# Patient Record
Sex: Female | Born: 1970 | Race: Black or African American | Hispanic: No | Smoking: Never smoker
Health system: Southern US, Community
[De-identification: ages and names within clinical notes are randomized; demographics above are authoritative.]

## PROBLEM LIST (undated history)

## (undated) DIAGNOSIS — D509 Iron deficiency anemia, unspecified: Secondary | ICD-10-CM

## (undated) DIAGNOSIS — E669 Obesity, unspecified: Secondary | ICD-10-CM

## (undated) DIAGNOSIS — I10 Essential (primary) hypertension: Secondary | ICD-10-CM

## (undated) DIAGNOSIS — D500 Iron deficiency anemia secondary to blood loss (chronic): Secondary | ICD-10-CM

## (undated) DIAGNOSIS — N921 Excessive and frequent menstruation with irregular cycle: Secondary | ICD-10-CM

## (undated) HISTORY — DX: Obesity, unspecified: E66.9

## (undated) HISTORY — DX: Excessive and frequent menstruation with irregular cycle: N92.1

## (undated) HISTORY — DX: Iron deficiency anemia secondary to blood loss (chronic): D50.0

## (undated) HISTORY — DX: Iron deficiency anemia, unspecified: D50.9

## (undated) HISTORY — DX: Essential (primary) hypertension: I10

---
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
## 1999-10-06 ENCOUNTER — Emergency Department (HOSPITAL_COMMUNITY): Admission: EM | Admit: 1999-10-06 | Discharge: 1999-10-06 | Payer: Self-pay | Admitting: Emergency Medicine

## 2000-07-16 ENCOUNTER — Inpatient Hospital Stay (HOSPITAL_COMMUNITY): Admission: AD | Admit: 2000-07-16 | Discharge: 2000-07-16 | Payer: Self-pay | Admitting: Obstetrics & Gynecology

## 2000-08-10 ENCOUNTER — Other Ambulatory Visit: Admission: RE | Admit: 2000-08-10 | Discharge: 2000-08-10 | Payer: Self-pay | Admitting: Obstetrics and Gynecology

## 2000-09-18 ENCOUNTER — Encounter: Payer: Self-pay | Admitting: Obstetrics and Gynecology

## 2000-09-18 ENCOUNTER — Ambulatory Visit (HOSPITAL_COMMUNITY): Admission: RE | Admit: 2000-09-18 | Discharge: 2000-09-18 | Payer: Self-pay | Admitting: Obstetrics and Gynecology

## 2000-10-12 ENCOUNTER — Encounter: Payer: Self-pay | Admitting: Obstetrics and Gynecology

## 2000-10-12 ENCOUNTER — Ambulatory Visit (HOSPITAL_COMMUNITY): Admission: RE | Admit: 2000-10-12 | Discharge: 2000-10-12 | Payer: Self-pay | Admitting: Obstetrics and Gynecology

## 2000-12-01 ENCOUNTER — Encounter: Payer: Self-pay | Admitting: Obstetrics and Gynecology

## 2000-12-01 ENCOUNTER — Ambulatory Visit (HOSPITAL_COMMUNITY): Admission: RE | Admit: 2000-12-01 | Discharge: 2000-12-01 | Payer: Self-pay | Admitting: Obstetrics and Gynecology

## 2000-12-12 ENCOUNTER — Encounter: Payer: Self-pay | Admitting: Obstetrics and Gynecology

## 2000-12-12 ENCOUNTER — Ambulatory Visit (HOSPITAL_COMMUNITY): Admission: RE | Admit: 2000-12-12 | Discharge: 2000-12-12 | Payer: Self-pay | Admitting: Obstetrics and Gynecology

## 2001-01-22 ENCOUNTER — Encounter: Payer: Self-pay | Admitting: Obstetrics and Gynecology

## 2001-01-22 ENCOUNTER — Ambulatory Visit (HOSPITAL_COMMUNITY): Admission: RE | Admit: 2001-01-22 | Discharge: 2001-01-22 | Payer: Self-pay | Admitting: Obstetrics and Gynecology

## 2001-02-09 ENCOUNTER — Inpatient Hospital Stay (HOSPITAL_COMMUNITY): Admission: AD | Admit: 2001-02-09 | Discharge: 2001-02-13 | Payer: Self-pay | Admitting: Obstetrics and Gynecology

## 2001-04-17 ENCOUNTER — Emergency Department (HOSPITAL_COMMUNITY): Admission: EM | Admit: 2001-04-17 | Discharge: 2001-04-17 | Payer: Self-pay | Admitting: Emergency Medicine

## 2001-04-17 ENCOUNTER — Encounter: Payer: Self-pay | Admitting: Emergency Medicine

## 2001-12-19 ENCOUNTER — Other Ambulatory Visit: Admission: RE | Admit: 2001-12-19 | Discharge: 2001-12-19 | Payer: Self-pay | Admitting: Obstetrics and Gynecology

## 2002-08-28 ENCOUNTER — Emergency Department (HOSPITAL_COMMUNITY): Admission: EM | Admit: 2002-08-28 | Discharge: 2002-08-28 | Payer: Self-pay | Admitting: Emergency Medicine

## 2003-04-16 ENCOUNTER — Other Ambulatory Visit: Admission: RE | Admit: 2003-04-16 | Discharge: 2003-04-16 | Payer: Self-pay | Admitting: Obstetrics and Gynecology

## 2003-07-12 ENCOUNTER — Emergency Department (HOSPITAL_COMMUNITY): Admission: EM | Admit: 2003-07-12 | Discharge: 2003-07-12 | Payer: Self-pay | Admitting: Emergency Medicine

## 2003-08-30 HISTORY — PX: TUBAL LIGATION: SHX77

## 2003-12-25 ENCOUNTER — Ambulatory Visit (HOSPITAL_COMMUNITY): Admission: RE | Admit: 2003-12-25 | Discharge: 2003-12-25 | Payer: Self-pay | Admitting: Cardiovascular Disease

## 2004-01-19 ENCOUNTER — Ambulatory Visit (HOSPITAL_COMMUNITY): Admission: RE | Admit: 2004-01-19 | Discharge: 2004-01-19 | Payer: Self-pay | Admitting: Obstetrics and Gynecology

## 2004-02-17 ENCOUNTER — Ambulatory Visit (HOSPITAL_COMMUNITY): Admission: RE | Admit: 2004-02-17 | Discharge: 2004-02-17 | Payer: Self-pay | Admitting: Obstetrics and Gynecology

## 2004-06-10 ENCOUNTER — Encounter (INDEPENDENT_AMBULATORY_CARE_PROVIDER_SITE_OTHER): Payer: Self-pay | Admitting: *Deleted

## 2004-06-10 ENCOUNTER — Inpatient Hospital Stay (HOSPITAL_COMMUNITY): Admission: RE | Admit: 2004-06-10 | Discharge: 2004-06-13 | Payer: Self-pay | Admitting: Obstetrics and Gynecology

## 2004-06-18 ENCOUNTER — Emergency Department (HOSPITAL_COMMUNITY): Admission: EM | Admit: 2004-06-18 | Discharge: 2004-06-19 | Payer: Self-pay | Admitting: Emergency Medicine

## 2004-07-21 ENCOUNTER — Other Ambulatory Visit: Admission: RE | Admit: 2004-07-21 | Discharge: 2004-07-21 | Payer: Self-pay | Admitting: Obstetrics and Gynecology

## 2004-12-11 IMAGING — US US OB FOLLOW-UP
1 series · 13 of 28 positions shown · non-contrast
Comparison: none

CLINICAL DATA: Incomplete imaging of the fetal spine and heart on previous anatomy evaluation.

[Series 1: unknown · 0.32mm/px · 13 of 34 slices shown]
[im 2/34]
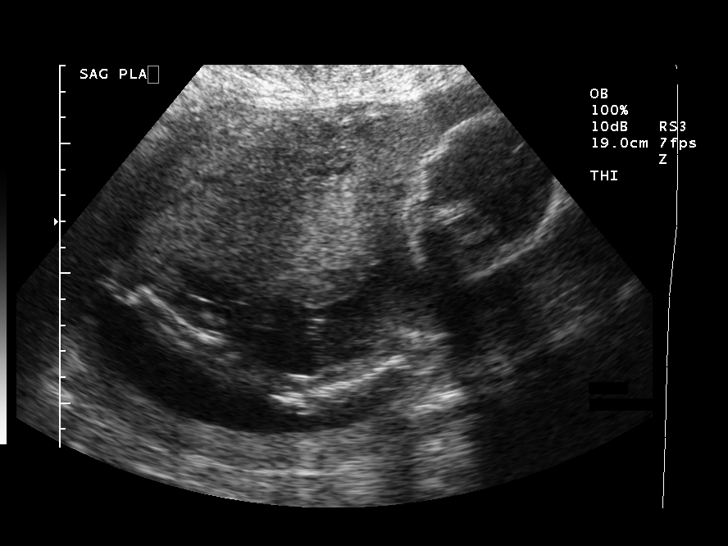
[im 4/34]
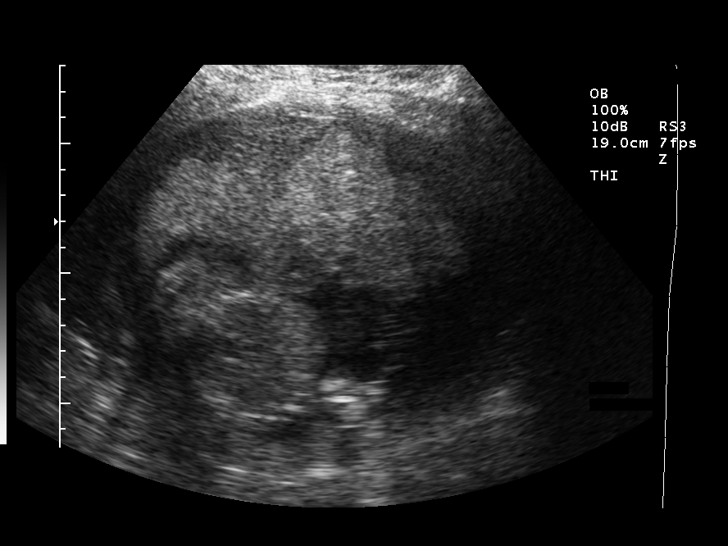
[im 7/34]
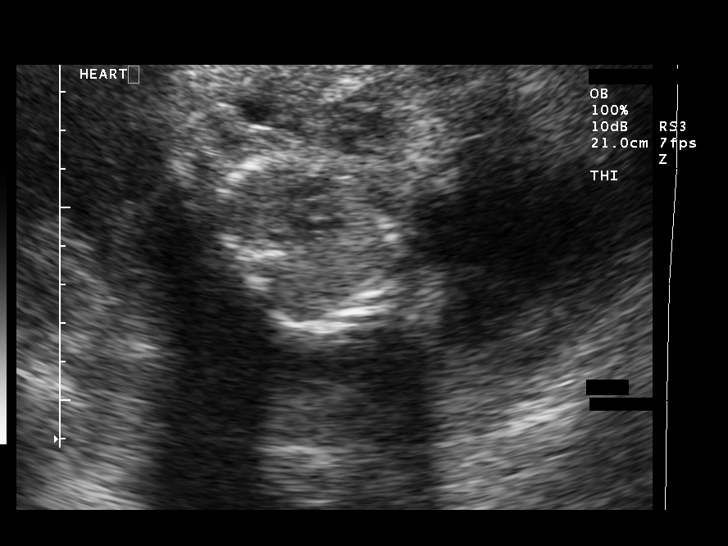
[im 9/34]
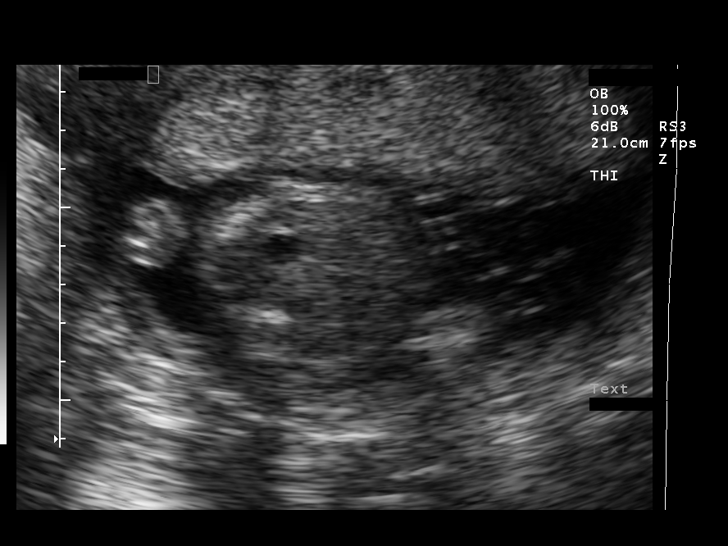
[im 12/34]
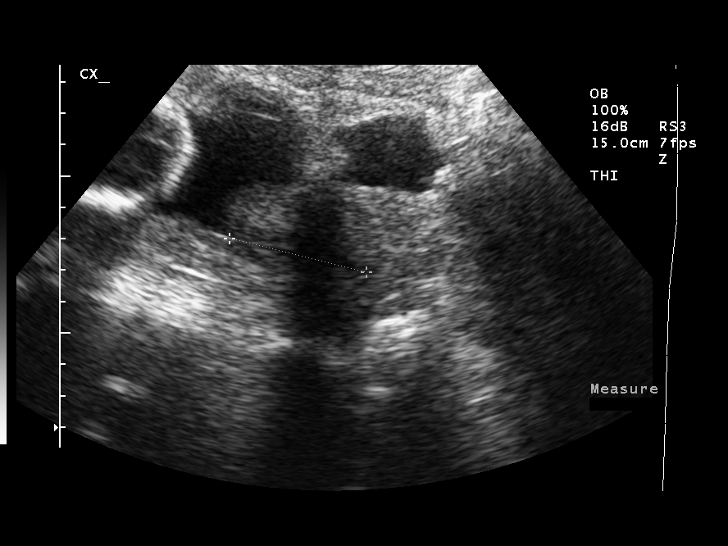
[im 14/34]
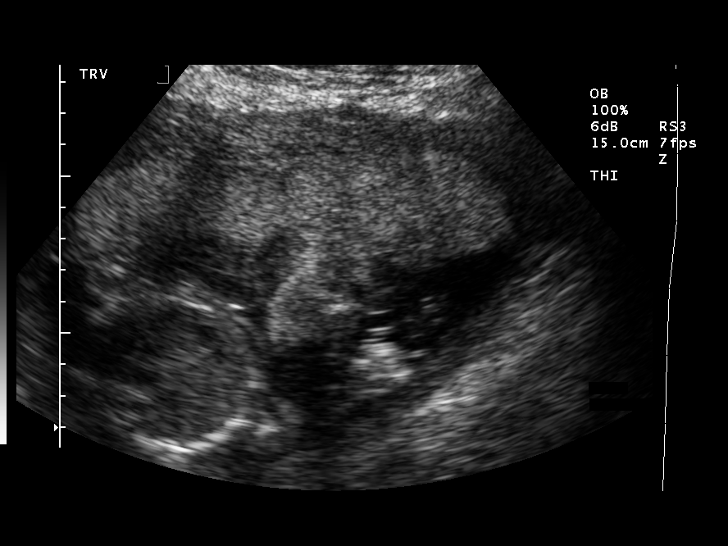
[im 18/34]
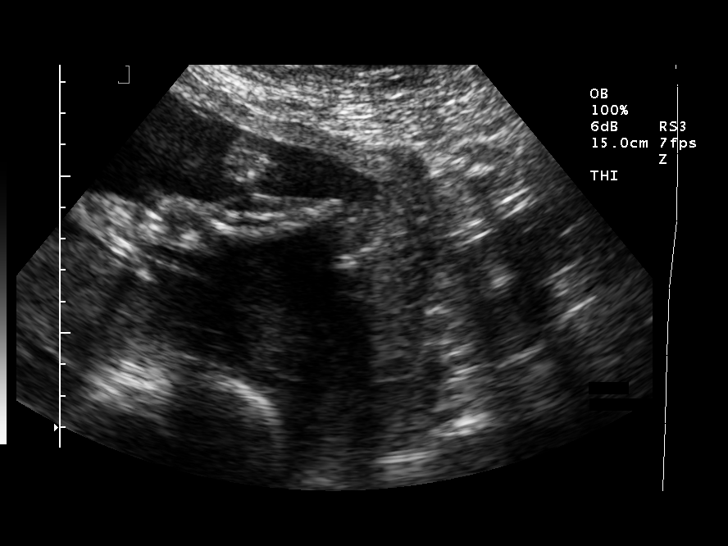
[im 20/34]
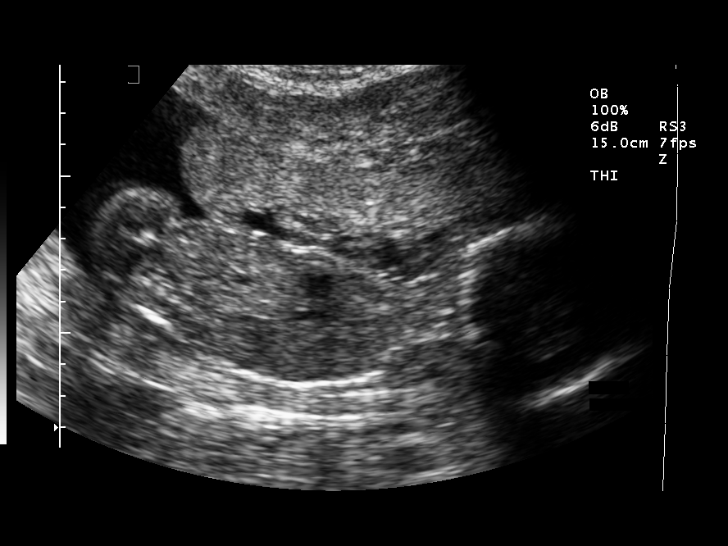
[im 23/34]
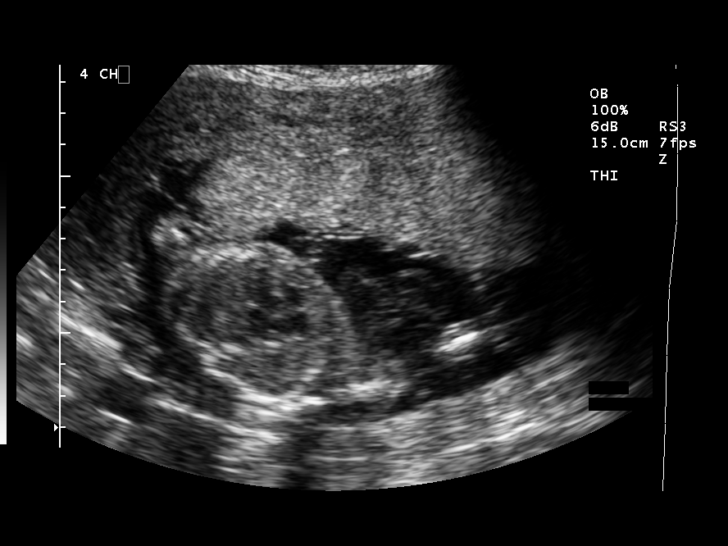
[im 25/34]
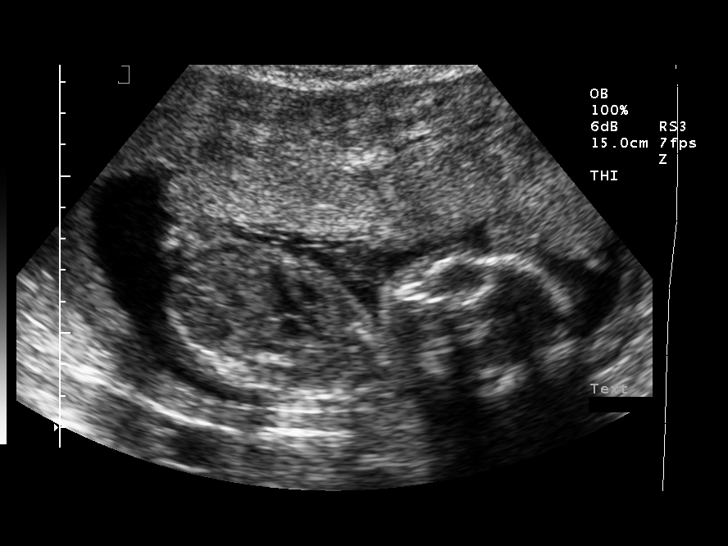
[im 27/34]
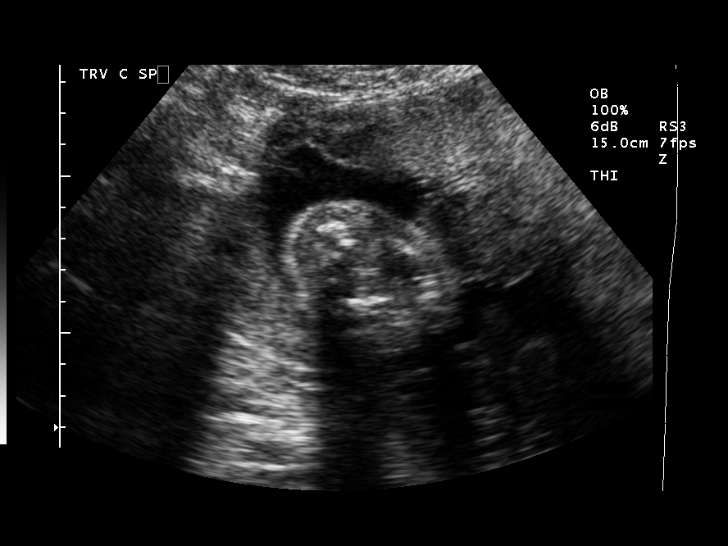
[im 30/34]
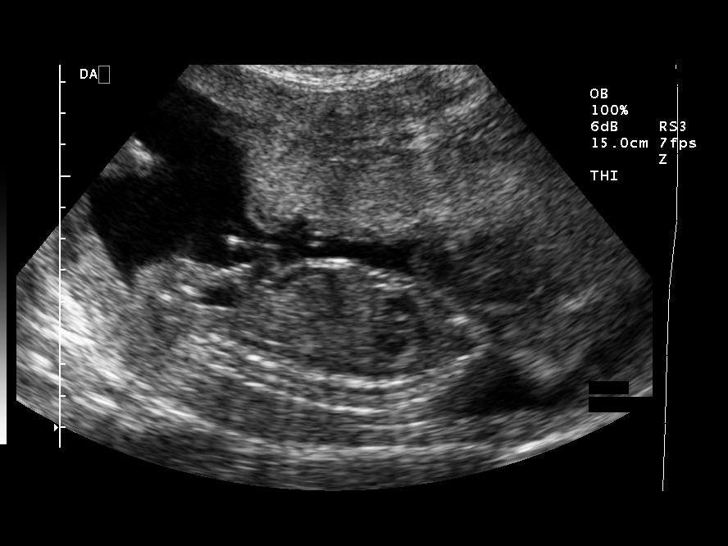
[im 32/34]
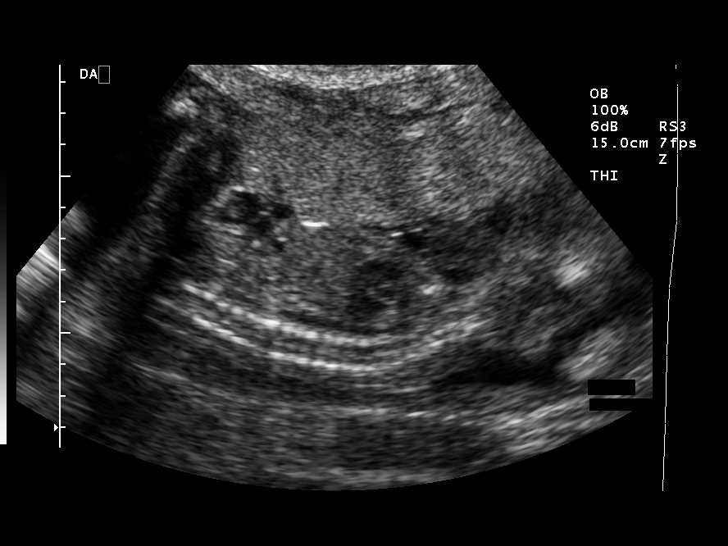

[13 of 28 positions shown; findings below may reference images not displayed]

OBSTETRICAL ULTRASOUND RE-EVALUATION
 Comparison 01/19/04.
 Number of Fetuses:  1
 Heart Rate:  147
 Movement:  Yes
 Breathing:  No
 Presentation:  Cephalic 
 Placental Location:  Anterior
 Grade:  I
 Previa:  No
 Amniotic Fluid (subjective):  Normal
 Amniotic Fluid (objective):  6.2 cm Vertical pocket 
 Fetal measurements were not requested on today’s exam.
 FETAL ANATOMY
 Lateral Ventricles:  Previously seen 
 Thalami/CSP:  Previously seen 
 Posterior Fossa:  Previously seen 
 Nuchal Region:  Previously seen 
 Spine:  Not visualized 
 4 Chamber Heart on Left:  Previously seen 
 Stomach on Left:  Visualized 
 3 Vessel Cord:  Previously seen 
 Cord Insertion Site:  Previously seen 
 Kidneys:  Visualized 
 Bladder:  Visualized 
 Extremities:  Previously seen 

 ADDITIONAL ANATOMY VISUALIZED:  LVOT, RVOT, diaphragm, heel, ductal arch, and female genitalia

 Evaluation limited by:  Maternal habitus

 MATERNAL FINDINGS
 Cervix:  4.4 cm Transabdominally
IMPRESSION: Single living intrauterine fetus in cephalic presentation with subjectively normal amniotic fluid volume.  
 As before, overall scan quality was limited by maternal body habitus.  Left and right ventricular outflow tracts were clearly demonstrated on today study.  A four chamber view of the heart and complete imaging of the spine could not be satisfactorily obtained.

## 2006-06-05 ENCOUNTER — Other Ambulatory Visit: Admission: RE | Admit: 2006-06-05 | Discharge: 2006-06-05 | Payer: Self-pay | Admitting: Obstetrics and Gynecology

## 2008-01-16 ENCOUNTER — Emergency Department (HOSPITAL_COMMUNITY): Admission: EM | Admit: 2008-01-16 | Discharge: 2008-01-17 | Payer: Self-pay | Admitting: Emergency Medicine

## 2008-11-10 ENCOUNTER — Ambulatory Visit: Payer: Self-pay | Admitting: Oncology

## 2008-12-19 LAB — CBC WITH DIFFERENTIAL/PLATELET
BASO%: 0.4 % (ref 0.0–2.0)
Basophils Absolute: 0 10*3/uL (ref 0.0–0.1)
EOS%: 2.3 % (ref 0.0–7.0)
Eosinophils Absolute: 0.2 10*3/uL (ref 0.0–0.5)
HCT: 30.1 % — ABNORMAL LOW (ref 34.8–46.6)
HGB: 9.5 g/dL — ABNORMAL LOW (ref 11.6–15.9)
LYMPH%: 26 % (ref 14.0–49.7)
MCH: 22.1 pg — ABNORMAL LOW (ref 25.1–34.0)
MCHC: 31.4 g/dL — ABNORMAL LOW (ref 31.5–36.0)
MCV: 70.6 fL — ABNORMAL LOW (ref 79.5–101.0)
MONO#: 0.6 10*3/uL (ref 0.1–0.9)
MONO%: 6.9 % (ref 0.0–14.0)
NEUT#: 5.5 10*3/uL (ref 1.5–6.5)
NEUT%: 64.4 % (ref 38.4–76.8)
Platelets: 456 10*3/uL — ABNORMAL HIGH (ref 145–400)
RBC: 4.27 10*6/uL (ref 3.70–5.45)
RDW: 17.6 % — ABNORMAL HIGH (ref 11.2–14.5)
WBC: 8.6 10*3/uL (ref 3.9–10.3)
lymph#: 2.2 10*3/uL (ref 0.9–3.3)

## 2008-12-19 LAB — COMPREHENSIVE METABOLIC PANEL
ALT: 12 U/L (ref 0–35)
AST: 10 U/L (ref 0–37)
Albumin: 3.8 g/dL (ref 3.5–5.2)
Alkaline Phosphatase: 77 U/L (ref 39–117)
BUN: 13 mg/dL (ref 6–23)
CO2: 24 mEq/L (ref 19–32)
Calcium: 9 mg/dL (ref 8.4–10.5)
Chloride: 101 mEq/L (ref 96–112)
Creatinine, Ser: 0.85 mg/dL (ref 0.40–1.20)
Glucose, Bld: 91 mg/dL (ref 70–99)
Potassium: 3.6 mEq/L (ref 3.5–5.3)
Sodium: 137 mEq/L (ref 135–145)
Total Bilirubin: 0.3 mg/dL (ref 0.3–1.2)
Total Protein: 6.9 g/dL (ref 6.0–8.3)

## 2008-12-19 LAB — CHCC SMEAR

## 2008-12-19 LAB — IRON AND TIBC
%SAT: 4 % — ABNORMAL LOW (ref 20–55)
Iron: 16 ug/dL — ABNORMAL LOW (ref 42–145)
TIBC: 376 ug/dL (ref 250–470)
UIBC: 360 ug/dL

## 2008-12-19 LAB — FERRITIN: Ferritin: 24 ng/mL (ref 10–291)

## 2009-01-16 ENCOUNTER — Ambulatory Visit: Payer: Self-pay | Admitting: Oncology

## 2009-03-19 ENCOUNTER — Ambulatory Visit: Payer: Self-pay | Admitting: Oncology

## 2009-03-24 LAB — CBC WITH DIFFERENTIAL/PLATELET
BASO%: 0.4 % (ref 0.0–2.0)
Basophils Absolute: 0 10*3/uL (ref 0.0–0.1)
EOS%: 2.3 % (ref 0.0–7.0)
Eosinophils Absolute: 0.2 10*3/uL (ref 0.0–0.5)
HCT: 33.6 % — ABNORMAL LOW (ref 34.8–46.6)
HGB: 10.6 g/dL — ABNORMAL LOW (ref 11.6–15.9)
LYMPH%: 31.2 % (ref 14.0–49.7)
MCH: 24 pg — ABNORMAL LOW (ref 25.1–34.0)
MCHC: 31.7 g/dL (ref 31.5–36.0)
MCV: 75.9 fL — ABNORMAL LOW (ref 79.5–101.0)
MONO#: 0.5 10*3/uL (ref 0.1–0.9)
MONO%: 6.4 % (ref 0.0–14.0)
NEUT#: 4.8 10*3/uL (ref 1.5–6.5)
NEUT%: 59.7 % (ref 38.4–76.8)
Platelets: 387 10*3/uL (ref 145–400)
RBC: 4.42 10*6/uL (ref 3.70–5.45)
RDW: 25.5 % — ABNORMAL HIGH (ref 11.2–14.5)
WBC: 8.1 10*3/uL (ref 3.9–10.3)
lymph#: 2.5 10*3/uL (ref 0.9–3.3)

## 2009-03-24 LAB — IRON AND TIBC
%SAT: 13 % — ABNORMAL LOW (ref 20–55)
Iron: 38 ug/dL — ABNORMAL LOW (ref 42–145)
TIBC: 282 ug/dL (ref 250–470)
UIBC: 244 ug/dL

## 2009-03-24 LAB — FERRITIN: Ferritin: 151 ng/mL (ref 10–291)

## 2009-06-19 ENCOUNTER — Ambulatory Visit: Payer: Self-pay | Admitting: Oncology

## 2009-08-08 ENCOUNTER — Emergency Department (HOSPITAL_COMMUNITY): Admission: EM | Admit: 2009-08-08 | Discharge: 2009-08-09 | Payer: Self-pay | Admitting: Emergency Medicine

## 2009-09-15 ENCOUNTER — Encounter: Admission: RE | Admit: 2009-09-15 | Discharge: 2009-09-15 | Payer: Self-pay | Admitting: Internal Medicine

## 2011-01-14 NOTE — Op Note (Signed)
Ashley Harrison, Ashley Harrison               ACCOUNT NO.:  [id number]   MEDICAL RECORD NO.:  [id number]          PATIENT TYPE:  INP   LOCATION:  9131                          FACILITY:  [facility]   PHYSICIAN:  Zenaida Niece, M.D.DATE OF BIRTH:  1971/03/03   DATE OF PROCEDURE:  06/10/2004  DATE OF DISCHARGE:                                 OPERATIVE REPORT   PREOPERATIVE DIAGNOSES:  1.  Intrauterine pregnancy at 39 weeks.  2.  Previous cesarean section.  3.  Desires surgical sterility.   POSTOPERATIVE DIAGNOSES:  1.  Intrauterine pregnancy at 39 weeks.  2.  Previous cesarean section.  3.  Desires surgical sterility.   PROCEDURE:  Repeat low transverse cesarean section and bilateral salpingo-  oophorectomy.   SURGEON:  Zenaida Niece, M.D.   ASSISTANT:  Malachi Pro. Ambrose Mantle, M.D.   ANESTHESIA:  Spinal.   ESTIMATED BLOOD LOSS:  1000 cc.   FINDINGS:  The patient delivered a viable female infant with Apgars of 9 and  9 and weight of 6 pounds 11 ounces.  The patient had normal anatomy, except  for an adhesion of the lower uterus to the anterior abdominal wall.   DESCRIPTION OF PROCEDURE:  The patient was taken to the operating room and  placed in the sitting position.  Dr. Cristela Blue instilled spinal  anesthesia, and she was placed in the dorsal supine position with a left  lateral tilt.  Her panniculus was taped up to an ether screen, and the  abdomen was prepped and draped in the usual sterile fashion and a Foley  catheter inserted.   The level of her anesthesia was found to be adequate, and her abdomen was  entered via her previous Pfannenstiel scar.  The bladder blade was placed  after an adhesion of the uterus to the anterior abdominal wall was taken  down with electrocautery.  The bladder was pushed inferior, and a 4-cm  transverse incision was made in the lower uterine segment.  Once the  amniotic cavity was entered, the incision was extended bilaterally with  bandage scissors  and digitally.  The membranes were ruptured and revealed  clear fluid.  The fetal vertex was grasped and delivered through the  incision atraumatically.  The mouth and nares were suctioned and the  remainder of the infant delivered atraumatically.  The cord was doubly  clamped and cut and the infant handed to the awaiting pediatric team.  Cord  blood and a segment of cord for gas were obtained, and the placenta  delivered spontaneously.  The uterus was wiped dry with a clean lap pad and  all clots and debris removed.  The uterine incision was inspected and found  to be free of extensions.  The bladder was very close to the edge of the  lower portion of the incision.  This was dissected away bluntly and sharply.  The uterine incision was then closed in one layer being a running locking  layer with #1 chromic.  Bleeding from serosal edges was controlled with  electrocautery.  Bleeding from the left middle of the incision was  controlled with two figure-of-eight sutures of #1 chromic.   Attention was turned to the tubal ligation.  Both fallopian tubes were  identified and traced to their fimbriated ends.  Both tubes were slightly  tortuous and fairly thick.  A knuckle of tube on each side was ligated with  0 plain gut suture.  The knuckle of tube was then removed sharply.  On both  sides, both tubal ostia were identified and the stumps were hemostatic.  The  uterine incision was again inspected and found to be hemostatic.  The  subfascial space was then irrigated and made hemostatic with electrocautery.  The rectus muscles were reapproximated in the midline with interrupted  sutures of 0 Vicryl.  The fascia was closed in a running fashion starting at  both ends and meeting in the middle with 0 Vicryl.  The subcutaneous tissue  was irrigated and made hemostatic with electrocautery.  The subcutaneous  tissue was then closed with running 2-0 plain gut suture.  The skin was then  closed with  staples and a sterile dressing.   The patient tolerated the procedure well and was taken to the recovery room  in stable condition.  Counts were correct x2.  She received Ancef 1 g after  cord clamp.     Todd   TDM/MEDQ  D:  06/10/2004  T:  06/10/2004  Job:  [id number]

## 2011-01-14 NOTE — H&P (Signed)
NAMEEVALEEN, SANT               ACCOUNT NO.:  [id number]   MEDICAL RECORD NO.:  [id number]          PATIENT TYPE:  INP   LOCATION:  NA                            FACILITY:  [facility]   PHYSICIAN:  Zenaida Niece, M.D.DATE OF BIRTH:  02-Jun-1971   DATE OF ADMISSION:  06/10/2004  DATE OF DISCHARGE:                                HISTORY & PHYSICAL   CHIEF COMPLAINT:  For repeat cesarean section.   HISTORY OF PRESENT ILLNESS:  This is a 40 year old black female, gravida 2,  para 1-0-0-1 with an EGA of [redacted] weeks by an LMP consistent with a 12-week  ultrasound with a due date of 10/20 who presents for repeat cesarean  section.  The patient had a low transverse cesarean section with her first  pregnancy and is cleared for a trial of labor but wishes to proceed with  repeat cesarean section.  Prenatal care has been complicated by the fact  that with her first pregnancy, the patient had either a postpartum  cardiomyopathy versus heart failure due to a hypertensive crisis.  She had  been diagnosed with chronic hypertension and this has been stable throughout  the pregnancy.  Her last visit with Dr. Jacinto Halim was on 7/14 and he felt that  at that point she was stable from a cardiology standpoint and that she did  not require hypertensives.  She did have a cardiac MRI in April of this year  which revealed normal left ventricular cavity size and function with an  ejection fraction of 64%.  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  Dr. Amie Critchley of [facility] has been following her for this.  She also has known gallstones.  The patient has declined followup MRIs during the pregnancy and last  ultrasound to assess the pancreas was on May 23 and that revealed a stable  pancreatic mass.  She has had no significant chest pain, shortness of  breath, or palpitations during this pregnancy and has essentially felt  fairly well.  She did have a viral upper  respiratory infection at 36 weeks  which has resolved.   PRENATAL LABORATORIES:  Blood type is O positive with a negative antibody  screen, sickle screen is negative, RPR nonreactive, rubella immune,  hepatitis B surface antigen negative.  Gonorrhea and chlamydia are negative.  Urine culture is positive for group B strep.  Triple screen is normal.  One  hour Glucola is 128.   PAST OBSTETRIC HISTORY:  June, 2002 she had a low transverse cesarean  section performed for suspected fetal macrosomia, polyhydramnios, and failed  induction.  This was a viable female that weighed 7 pounds, 1 ounce and  anatomy was otherwise normal.  After that pregnancy she did develop what is  hard to tell is either a postpartum cardiomyopathy or a hypertensive crisis.   PAST MEDICAL HISTORY:  Hypertension.  She does have this pancreatic mass and  cholelithiasis.   PAST SURGICAL HISTORY:  Umbilical hernia and right inguinal hernia repair as  a child as well as the cesarean section.  ALLERGIES:  None known.   CURRENT MEDICATIONS:  None.   SOCIAL HISTORY:  The patient is married and denies alcohol, tobacco, or drug  use.   FAMILY HISTORY:  No significant congenital defects.   REVIEW OF SYSTEMS:  Essentially normal.   PHYSICAL EXAMINATION:  VITAL SIGNS:  Weight is 292 pounds, blood pressure is  140/78.  GENERAL:  This is a well-developed, black female who is in no acute  distress.  NECK:  Supple without lymphadenopathy or thyromegaly.  LUNGS:  Clear to auscultation.  HEART:  Regular rate and rhythm with a 2-3/6 systolic murmur.  ABDOMEN:  Gravid with a fundal height of 42 cm and a well-healed transverse  scar.  EXTREMITIES:  Have 1+ edema and are nontender.  PELVIC:  Cervix is fingertip, long, and high with a vertex presentation.   ASSESSMENT:  1.  Intrauterine pregnancy at 39 weeks.  2.  Previous cesarean section.  The patient is cleared for a trial of labor      but declines this and wishes to  undergo repeat cesarean  section.  Risks      of surgery including bleeding, infection, damage to surrounding organs      have been discussed and she understands.  3.  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  4.  Pancreatic mass.  5.  Cholelithiasis.  6.  Desires surgical sterility.  The patient understands that if we tie her      tubes it is a permanent with 1 in 150 failure rate and an increased risk      of ectopic if she does get pregnant.   PLAN:  To admit the patient on October 13 for a repeat cesarean section and  tubal ligation.     Todd   TDM/MEDQ  D:  06/09/2004  T:  06/09/2004  Job:  [id number]

## 2011-01-14 NOTE — Discharge Summary (Signed)
Ashley Harrison, Ashley Harrison               ACCOUNT NO.:  [id number]   MEDICAL RECORD NO.:  [id number]          PATIENT TYPE:  INP   LOCATION:  9131                          [facility]ILITY:  [facility]   PHYSICIAN:  Zenaida Niece, M.D.DATE OF BIRTH:  [birth year]   DATE OF ADMISSION:  06/10/2004  DATE OF DISCHARGE:  06/13/2004                                 DISCHARGE SUMMARY   ADMISSION DIAGNOSES:  1.  Intrauterine pregnancy at 39 weeks.  2.  Previous cesarean section.  3.  Desires surgical sterility.   DISCHARGE DIAGNOSES:  1.  Intrauterine pregnancy at 39 weeks.  2.  Previous cesarean section.  3.  Desires surgical sterility.   PROCEDURE:  On October 13, she had a repeat low transverse cesarean section  and tubal ligation.   HISTORY AND PHYSICAL:  Please see chart for full history and physical but  briefly, this is a 40 year old black female, gravida 2, para 1-0-0-1 with an  EGA of [redacted] weeks who is admitted for repeat cesarean section. She is cleared  for a trial of labor but declined this. Past history significant for the  fact that with her first delivery, she had either postpartum cardiomyopathy  versus heart failure due to a hypertensive crisis.  Prior to this pregnancy,  she was on antihypertensives but has had normal blood pressure off  medication during the pregnancy. She has also been followed by Dr. Magnus Ivan  for a pancreatic mass.   PHYSICAL EXAMINATION:  Significant for a blood pressure 140/78 and a weight  292 pounds. Abdomen is obese, gravid with fundal height 42 cm and a well  healed transverse scar. Cervix is fingertip, long and vertex is high.   HOSPITAL COURSE:  The patient was admitted on the day of surgery and  underwent a repeat low transverse cesarean section and tubal ligation under  a spinal anesthesia with estimated blood loss of 1000 mL.  She delivered a  viable female infant with Apgar's of 9 and 9 that weighed 6 pounds 11  ounces. There was adhesion of the lower  uterus to the anterior abdominal  wall.  Postpartum she had no significant complications. Predelivery  hemoglobin 9.8, post delivery 11.6. Blood pressure remained normal  throughout her hospital stay. On postoperative day #3, she was healing well  and was felt to be stable enough for discharge home.   DISCHARGE INSTRUCTIONS:  Regular diet, pelvic rest, no strenuous activity.  Followup is in 2-3 days for staple removal.   DISCHARGE MEDICATIONS:  Percocet p.r.n. pain and over the counter ibuprofen  p.r.n. and she is given our discharge pamphlet.    Todd  TDM/MEDQ  D:  06/26/2004  T:  06/26/2004  Job:  [id number]

## 2011-02-03 ENCOUNTER — Emergency Department (HOSPITAL_COMMUNITY)
Admission: EM | Admit: 2011-02-03 | Discharge: 2011-02-03 | Disposition: A | Discharge status: Home / Self Care | Payer: Self-pay | Attending: Emergency Medicine | Admitting: Emergency Medicine

## 2011-02-03 DIAGNOSIS — R1915 Other abnormal bowel sounds: Secondary | ICD-10-CM | POA: Insufficient documentation

## 2011-02-03 DIAGNOSIS — R1033 Periumbilical pain: Secondary | ICD-10-CM | POA: Insufficient documentation

## 2011-02-03 DIAGNOSIS — Z79899 Other long term (current) drug therapy: Secondary | ICD-10-CM | POA: Insufficient documentation

## 2011-02-03 DIAGNOSIS — R197 Diarrhea, unspecified: Secondary | ICD-10-CM | POA: Insufficient documentation

## 2011-02-03 DIAGNOSIS — I10 Essential (primary) hypertension: Secondary | ICD-10-CM | POA: Insufficient documentation

## 2011-02-03 DIAGNOSIS — E669 Obesity, unspecified: Secondary | ICD-10-CM | POA: Insufficient documentation

## 2011-02-03 LAB — DIFFERENTIAL
Basophils Absolute: 0 10*3/uL (ref 0.0–0.1)
Basophils Relative: 0 % (ref 0–1)
Eosinophils Absolute: 0.2 10*3/uL (ref 0.0–0.7)
Eosinophils Relative: 2 % (ref 0–5)
Lymphocytes Relative: 17 % (ref 12–46)
Lymphs Abs: 1.9 10*3/uL (ref 0.7–4.0)
Monocytes Absolute: 0.7 10*3/uL (ref 0.1–1.0)
Monocytes Relative: 6 % (ref 3–12)
Neutro Abs: 8.5 10*3/uL — ABNORMAL HIGH (ref 1.7–7.7)
Neutrophils Relative %: 75 % (ref 43–77)

## 2011-02-03 LAB — URINALYSIS, ROUTINE W REFLEX MICROSCOPIC
Bilirubin Urine: NEGATIVE
Glucose, UA: NEGATIVE mg/dL
Ketones, ur: NEGATIVE mg/dL
Nitrite: NEGATIVE
Protein, ur: NEGATIVE mg/dL
Specific Gravity, Urine: 1.025 (ref 1.005–1.030)
Urobilinogen, UA: 1 mg/dL (ref 0.0–1.0)
pH: 6 (ref 5.0–8.0)

## 2011-02-03 LAB — COMPREHENSIVE METABOLIC PANEL
ALT: 13 U/L (ref 0–35)
AST: 15 U/L (ref 0–37)
Albumin: 3.4 g/dL — ABNORMAL LOW (ref 3.5–5.2)
Alkaline Phosphatase: 87 U/L (ref 39–117)
BUN: 10 mg/dL (ref 6–23)
CO2: 26 mEq/L (ref 19–32)
Calcium: 9.7 mg/dL (ref 8.4–10.5)
Chloride: 97 mEq/L (ref 96–112)
Creatinine, Ser: 0.66 mg/dL (ref 0.4–1.2)
GFR calc Af Amer: >60 mL/min (ref >60)
GFR calc non Af Amer: >60 mL/min (ref >60)
Glucose, Bld: 126 mg/dL — ABNORMAL HIGH (ref 70–99)
Potassium: 3.3 mEq/L — ABNORMAL LOW (ref 3.5–5.1)
Sodium: 136 mEq/L (ref 135–145)
Total Bilirubin: 0.3 mg/dL (ref 0.3–1.2)
Total Protein: 7.2 g/dL (ref 6.0–8.3)

## 2011-02-03 LAB — URINE MICROSCOPIC-ADD ON

## 2011-02-03 LAB — CBC
HCT: 33.1 % — ABNORMAL LOW (ref 36.0–46.0)
Hemoglobin: 10.5 g/dL — ABNORMAL LOW (ref 12.0–15.0)
MCH: 22.9 pg — ABNORMAL LOW (ref 26.0–34.0)
MCHC: 31.7 g/dL (ref 30.0–36.0)
MCV: 72.3 fL — ABNORMAL LOW (ref 78.0–100.0)
Platelets: 403 10*3/uL — ABNORMAL HIGH (ref 150–400)
RBC: 4.58 MIL/uL (ref 3.87–5.11)
RDW: 16.4 % — ABNORMAL HIGH (ref 11.5–15.5)
WBC: 11.3 10*3/uL — ABNORMAL HIGH (ref 4.0–10.5)

## 2011-02-03 LAB — LIPASE, BLOOD: Lipase: 21 U/L (ref 11–59)

## 2011-09-09 ENCOUNTER — Telehealth: Payer: Self-pay | Admitting: Oncology

## 2011-09-09 NOTE — Telephone Encounter (Signed)
lmonvm for pt re calling me for appt w/FS. °

## 2011-09-12 ENCOUNTER — Telehealth: Payer: Self-pay | Admitting: Oncology

## 2011-09-12 NOTE — Telephone Encounter (Signed)
S/w pt today re appt for 1/23 @ 10:30 am w/FS.

## 2011-09-14 ENCOUNTER — Telehealth: Payer: Self-pay | Admitting: Oncology

## 2011-09-14 NOTE — Telephone Encounter (Signed)
Referred by Dr. Allyne Gee Dx- IDA

## 2011-09-16 ENCOUNTER — Other Ambulatory Visit: Payer: Self-pay | Admitting: Oncology

## 2011-09-16 DIAGNOSIS — D649 Anemia, unspecified: Secondary | ICD-10-CM

## 2011-09-20 ENCOUNTER — Encounter: Payer: Self-pay | Admitting: *Deleted

## 2011-09-21 ENCOUNTER — Other Ambulatory Visit (HOSPITAL_BASED_OUTPATIENT_CLINIC_OR_DEPARTMENT_OTHER): Payer: 59 | Admitting: Lab

## 2011-09-21 ENCOUNTER — Telehealth: Payer: Self-pay | Admitting: Oncology

## 2011-09-21 ENCOUNTER — Ambulatory Visit (HOSPITAL_BASED_OUTPATIENT_CLINIC_OR_DEPARTMENT_OTHER): Payer: 59 | Admitting: Oncology

## 2011-09-21 ENCOUNTER — Ambulatory Visit (HOSPITAL_BASED_OUTPATIENT_CLINIC_OR_DEPARTMENT_OTHER): Payer: 59

## 2011-09-21 VITALS — BP 130/82 | HR 83 | Temp 98.4°F | Ht 62.5 in | Wt 278.5 lb

## 2011-09-21 DIAGNOSIS — D509 Iron deficiency anemia, unspecified: Secondary | ICD-10-CM

## 2011-09-21 DIAGNOSIS — D649 Anemia, unspecified: Secondary | ICD-10-CM

## 2011-09-21 DIAGNOSIS — N920 Excessive and frequent menstruation with regular cycle: Secondary | ICD-10-CM

## 2011-09-21 LAB — COMPREHENSIVE METABOLIC PANEL
ALT: 8 U/L (ref 0–35)
AST: 11 U/L (ref 0–37)
CO2: 25 mEq/L (ref 19–32)
Creatinine, Ser: 0.69 mg/dL (ref 0.50–1.10)
Total Bilirubin: 0.4 mg/dL (ref 0.3–1.2)

## 2011-09-21 LAB — IRON AND TIBC
%SAT: 6 % — ABNORMAL LOW (ref 20–55)
TIBC: 422 ug/dL (ref 250–470)

## 2011-09-21 LAB — CBC WITH DIFFERENTIAL/PLATELET
BASO%: 0.1 % (ref 0.0–2.0)
EOS%: 2.4 % (ref 0.0–7.0)
HCT: 32.7 % — ABNORMAL LOW (ref 34.8–46.6)
LYMPH%: 29.7 % (ref 14.0–49.7)
MCH: 20.7 pg — ABNORMAL LOW (ref 25.1–34.0)
MCHC: 30.3 g/dL — ABNORMAL LOW (ref 31.5–36.0)
MCV: 68.4 fL — ABNORMAL LOW (ref 79.5–101.0)
NEUT%: 60.5 % (ref 38.4–76.8)
Platelets: 418 10*3/uL — ABNORMAL HIGH (ref 145–400)

## 2011-09-21 LAB — FERRITIN: Ferritin: 35 ng/mL (ref 10–291)

## 2011-09-21 NOTE — Telephone Encounter (Signed)
Gv pt appt for feb-april2013 °

## 2011-09-21 NOTE — Progress Notes (Signed)
Hematology and Oncology Follow Up Visit  Ashley Harrison [id number] 1971/06/20 40 y.o. 09/21/2011 11:54 AM  CC: Ashley Harrison. Ashley Harrison, M.D.   Principle Diagnosis: This is a 41 year old female with iron deficiency anemia that was diagnosed in April 2010.  She had presented with a hemoglobin of 9.5, MCV of 70.6, RDW of 17.6 and her iron studies showed a 4% saturation and iron level of 15.    Prior Therapy: The patient received iron dextran in total of 1 g received in June 2010.  Interim History: Ashley Harrison presents today for a follow-up visit. She is a pleasant women whom I saw last in 2010 for anemia. After iron dextran,  clinically, has tolerated  without any major toxicity at this time.  She has reported some symptomatologic improvement after the IV iron.  Her performance status and activity level, exercise tolerance has improved then.  Now, she presents with similar symptoms of weakness, fatigue and heavy menstrual bleeding. She report no GI bleeding, and have not been taking any oral Fe.  Medications: I have reviewed the patient's current medications. Current outpatient prescriptions:lisinopril-hydrochlorothiazide (PRINZIDE,ZESTORETIC) 20-25 MG per tablet, Take 1 tablet by mouth daily., Disp: , Rfl:   Allergies: No Known Allergies  Past Medical History, Surgical history, Social history, and Family History were reviewed and updated.  Review of Systems: Constitutional:  Negative for fever, chills, night sweats, anorexia, weight loss, pain. Cardiovascular: no chest pain or dyspnea on exertion Respiratory: no cough, shortness of breath, or wheezing Neurological: no TIA or stroke symptoms Dermatological: negative ENT: negative Skin: Negative. Gastrointestinal: no abdominal pain, change in bowel habits, or black or bloody stools Genito-Urinary: no dysuria, trouble voiding, or hematuria Hematological and Lymphatic: negative Breast: negative Musculoskeletal: negative Remaining ROS  negative. Physical Exam: Blood pressure 130/82, pulse 83, temperature 98.4 F (36.9 C), temperature source Oral, height 5' 2.5" (1.588 m), weight 278 lb 8 oz (126.327 kg). ECOG: 0 General appearance: alert Head: Normocephalic, without obvious abnormality, atraumatic Neck: no adenopathy, no carotid bruit, no JVD, supple, symmetrical, trachea midline and thyroid not enlarged, symmetric, no tenderness/mass/nodules Lymph nodes: Cervical, supraclavicular, and axillary nodes normal. Heart:regular rate and rhythm, S1, S2 normal, no murmur, click, rub or gallop Lung:chest clear, no wheezing, rales, normal symmetric air entry Abdomin: soft, non-tender, without masses or organomegaly EXT:no erythema, induration, or nodules   Lab Results: Lab Results  Component Value Date   WBC 6.7 09/21/2011   HGB 9.9* 09/21/2011   HCT 32.7* 09/21/2011   MCV 68.4* 09/21/2011   PLT 418* 09/21/2011     Chemistry      Component Value Date/Time   NA 136 02/03/2011 0111   K 3.3* 02/03/2011 0111   CL 97 02/03/2011 0111   CO2 26 02/03/2011 0111   BUN 10 02/03/2011 0111   CREATININE 0.66 02/03/2011 0111      Component Value Date/Time   CALCIUM 9.7 02/03/2011 0111   ALKPHOS 87 02/03/2011 0111   AST 15 02/03/2011 0111   ALT 13 02/03/2011 0111   BILITOT 0.3 02/03/2011 0111       Impression and Plan:  This is a 41 year old female with the following issues: 1. Iron deficiency anemia again her hemoglobin is down again with likely continuous Fe loss via menstrual bleeding The plan is to measure her iron stores at this time, and replace it with IV Fe at this time. I discussed with her the risks and benefits of Feraheme and she is agreeable  to proceed.   2. Heavy  menstrual cycles.  The plan is to have her follow up with her OB/GYN regarding that.   [facility], MD 1/23/201311:54 AM

## 2011-10-03 ENCOUNTER — Ambulatory Visit (HOSPITAL_BASED_OUTPATIENT_CLINIC_OR_DEPARTMENT_OTHER): Payer: 59

## 2011-10-03 VITALS — BP 122/84 | HR 77 | Temp 97.8°F

## 2011-10-03 DIAGNOSIS — D649 Anemia, unspecified: Secondary | ICD-10-CM

## 2011-10-03 MED ORDER — SODIUM CHLORIDE 0.9 % IV SOLN
Freq: Once | INTRAVENOUS | Status: AC
  Administered 2011-10-03: 10:00:00 via INTRAVENOUS

## 2011-10-03 MED ORDER — SODIUM CHLORIDE 0.9 % IV SOLN
1020.0000 mg | Freq: Once | INTRAVENOUS | Status: AC
  Administered 2011-10-03: 1020 mg via INTRAVENOUS
  Filled 2011-10-03: qty 34

## 2011-10-03 NOTE — Progress Notes (Signed)
1005 Reviewed with pt s/s of adverse reactions and to notify RN if any symptoms occur.  Pt verbalized understanding.  1020 Pt tolerated Feraheme infusion well.  No s/s of any reactions.

## 2011-12-21 ENCOUNTER — Telehealth: Payer: Self-pay | Admitting: Oncology

## 2011-12-21 ENCOUNTER — Ambulatory Visit (HOSPITAL_BASED_OUTPATIENT_CLINIC_OR_DEPARTMENT_OTHER): Payer: 59 | Admitting: Oncology

## 2011-12-21 ENCOUNTER — Other Ambulatory Visit (HOSPITAL_BASED_OUTPATIENT_CLINIC_OR_DEPARTMENT_OTHER): Payer: 59 | Admitting: Lab

## 2011-12-21 VITALS — BP 107/70 | HR 73 | Temp 97.6°F | Ht 62.5 in | Wt 286.7 lb

## 2011-12-21 DIAGNOSIS — D649 Anemia, unspecified: Secondary | ICD-10-CM

## 2011-12-21 LAB — COMPREHENSIVE METABOLIC PANEL
AST: 13 U/L (ref 0–37)
Albumin: 4 g/dL (ref 3.5–5.2)
Alkaline Phosphatase: 91 U/L (ref 39–117)
Calcium: 8.9 mg/dL (ref 8.4–10.5)
Chloride: 103 mEq/L (ref 96–112)
Glucose, Bld: 102 mg/dL — ABNORMAL HIGH (ref 70–99)
Potassium: 3.6 mEq/L (ref 3.5–5.3)
Sodium: 140 mEq/L (ref 135–145)
Total Protein: 6.5 g/dL (ref 6.0–8.3)

## 2011-12-21 LAB — CBC WITH DIFFERENTIAL/PLATELET
BASO%: 0.3 % (ref 0.0–2.0)
LYMPH%: 23.3 % (ref 14.0–49.7)
MCHC: 32.5 g/dL (ref 31.5–36.0)
MCV: 79.3 fL — ABNORMAL LOW (ref 79.5–101.0)
MONO#: 0.4 10*3/uL (ref 0.1–0.9)
MONO%: 4.7 % (ref 0.0–14.0)
Platelets: 319 10*3/uL (ref 145–400)
RBC: 4.49 10*6/uL (ref 3.70–5.45)
WBC: 8.6 10*3/uL (ref 3.9–10.3)
nRBC: 0 % (ref 0–0)

## 2011-12-21 NOTE — Progress Notes (Signed)
Hematology and Oncology Follow Up Visit  Ashley Harrison [id number] 1970/10/09 41 y.o. 12/21/2011 9:06 AM  CC: Ashley Harrison, M.D.   Principle Diagnosis: This is a 41 year old female with iron deficiency anemia that was diagnosed in April 2010.  She had presented with a hemoglobin of 9.5, MCV of 70.6, RDW of 17.6 and her iron studies showed a 4% saturation and iron level of 15.    Prior Therapy: The patient received iron dextran in total of 1 g received in June 2010. Repeated Fe infusion on 09/2011 in the form of Feraheme.  Interim History: [name] presents today for a follow-up visit. She is a pleasant women whom I have been following since 2010 for iron deficiency anemia. Clinically, she has tolerated  Feraheme without any major toxicity at this time.  She has reported some symptomatologic improvement after the IV iron.  Her performance status and activity level, exercise tolerance has improved.  She continued to have heavy menstrual bleeding. She report no GI bleeding, and have not been taking any oral Fe. No new complaints and slight improvement in her symptoms.   Medications: I have reviewed the patient's current medications. Current outpatient prescriptions:lisinopril-hydrochlorothiazide (PRINZIDE,ZESTORETIC) 20-25 MG per tablet, Take 1 tablet by mouth daily., Disp: , Rfl:   Allergies: No Known Allergies  Past Medical History, Surgical history, Social history, and Family History were reviewed and updated.  Review of Systems: Constitutional:  Negative for fever, chills, night sweats, anorexia, weight loss, pain. Cardiovascular: no chest pain or dyspnea on exertion Respiratory: no cough, shortness of breath, or wheezing Neurological: no TIA or stroke symptoms Dermatological: negative ENT: negative Skin: Negative. Gastrointestinal: no abdominal pain, change in bowel habits, or black or bloody stools Genito-Urinary: no dysuria, trouble voiding, or hematuria Hematological  and Lymphatic: negative Breast: negative Musculoskeletal: negative Remaining ROS negative. Physical Exam: Blood pressure 107/70, pulse 73, temperature 97.6 F (36.4 C), temperature source Oral, height 5' 2.5" (1.588 m), weight 286 lb 11.2 oz (130.046 kg). ECOG: 0 General appearance: alert Head: Normocephalic, without obvious abnormality, atraumatic Neck: no adenopathy, no carotid bruit, no JVD, supple, symmetrical, trachea midline and thyroid not enlarged, symmetric, no tenderness/mass/nodules Lymph nodes: Cervical, supraclavicular, and axillary nodes normal. Heart:regular rate and rhythm, S1, S2 normal, no murmur, click, rub or gallop Lung:chest clear, no wheezing, rales, normal symmetric air entry Abdomin: soft, non-tender, without masses or organomegaly EXT:no erythema, induration, or nodules   Lab Results: Lab Results  Component Value Date   WBC 8.6 12/21/2011   HGB 11.6 12/21/2011   HCT 35.6 12/21/2011   MCV 79.3* 12/21/2011   PLT 319 12/21/2011     Chemistry      Component Value Date/Time   NA 135 09/21/2011 1118   K 3.8 09/21/2011 1118   CL 99 09/21/2011 1118   CO2 25 09/21/2011 1118   BUN 9 09/21/2011 1118   CREATININE 0.69 09/21/2011 1118      Component Value Date/Time   CALCIUM 9.3 09/21/2011 1118   ALKPHOS 73 09/21/2011 1118   AST 11 09/21/2011 1118   ALT 8 09/21/2011 1118   BILITOT 0.4 09/21/2011 1118       Impression and Plan:  This is a 41 year old female with the following issues: 1. Iron deficiency anemia again her hemoglobin is improved after the Fe infusion. The plan is to measure her iron stores at this time, and replace it with IV Fe as needed.  2. Heavy menstrual cycles.  The plan is to have her follow  up with her OB/GYN regarding that. 3.         Follow up and repeat Fe studies in 6 months.    [facility], MD 4/24/20139:06 AM

## 2011-12-21 NOTE — Telephone Encounter (Signed)
Gave pt calendar appt for October 2013 lab and MD 

## 2011-12-22 ENCOUNTER — Encounter: Payer: Self-pay | Admitting: *Deleted

## 2012-06-21 ENCOUNTER — Other Ambulatory Visit: Payer: 59

## 2012-06-21 ENCOUNTER — Ambulatory Visit: Payer: 59 | Admitting: Oncology

## 2012-07-31 DIAGNOSIS — R109 Unspecified abdominal pain: Secondary | ICD-10-CM | POA: Insufficient documentation

## 2012-07-31 DIAGNOSIS — R197 Diarrhea, unspecified: Secondary | ICD-10-CM | POA: Insufficient documentation

## 2012-07-31 DIAGNOSIS — E669 Obesity, unspecified: Secondary | ICD-10-CM | POA: Insufficient documentation

## 2012-07-31 DIAGNOSIS — E860 Dehydration: Secondary | ICD-10-CM | POA: Insufficient documentation

## 2012-07-31 DIAGNOSIS — I10 Essential (primary) hypertension: Secondary | ICD-10-CM | POA: Insufficient documentation

## 2012-07-31 DIAGNOSIS — Z862 Personal history of diseases of the blood and blood-forming organs and certain disorders involving the immune mechanism: Secondary | ICD-10-CM | POA: Insufficient documentation

## 2012-08-01 ENCOUNTER — Emergency Department (HOSPITAL_COMMUNITY)
Admission: EM | Admit: 2012-08-01 | Discharge: 2012-08-01 | Disposition: A | Discharge status: Home / Self Care | Payer: 59 | Attending: Emergency Medicine | Admitting: Emergency Medicine

## 2012-08-01 ENCOUNTER — Encounter (HOSPITAL_COMMUNITY): Payer: Self-pay | Admitting: *Deleted

## 2012-08-01 DIAGNOSIS — E860 Dehydration: Secondary | ICD-10-CM

## 2012-08-01 DIAGNOSIS — R109 Unspecified abdominal pain: Secondary | ICD-10-CM

## 2012-08-01 DIAGNOSIS — R197 Diarrhea, unspecified: Secondary | ICD-10-CM

## 2012-08-01 LAB — URINALYSIS, ROUTINE W REFLEX MICROSCOPIC
Glucose, UA: NEGATIVE mg/dL
Specific Gravity, Urine: 1.024 (ref 1.005–1.030)
pH: 5.5 (ref 5.0–8.0)

## 2012-08-01 LAB — URINE MICROSCOPIC-ADD ON

## 2012-08-01 MED ORDER — ONDANSETRON 4 MG PO TBDP: 4.0000 mg | ORAL_TABLET | Freq: Three times a day (TID) | ORAL | Status: DC | PRN

## 2012-08-01 MED ORDER — DIPHENOXYLATE-ATROPINE 2.5-0.025 MG PO TABS: 1.0000 | ORAL_TABLET | Freq: Four times a day (QID) | ORAL | Status: DC | PRN

## 2012-08-01 MED ORDER — SODIUM CHLORIDE 0.9 % IV BOLUS (SEPSIS)
1000.0000 mL | Freq: Once | INTRAVENOUS | Status: AC
  Administered 2012-08-01: 1000 mL via INTRAVENOUS

## 2012-08-01 MED ORDER — KETOROLAC TROMETHAMINE 30 MG/ML IJ SOLN
30.0000 mg | INTRAMUSCULAR | Status: AC
  Administered 2012-08-01: 30 mg via INTRAVENOUS
  Filled 2012-08-01: qty 1

## 2012-08-01 NOTE — ED Notes (Signed)
Pt her for abdominal pain. Pt states that pain started on Monday. Pt states pain accompanied by diarrhea. Pt states that on Saturday her children contracted a stomach virus causing them to have N/V/D. Pt rates her pain as 1/10 at the present moment. Pt states that it hurts worse when she has a BM.

## 2012-08-01 NOTE — ED Provider Notes (Addendum)
History     CSN: [id number]  Arrival date & time 07/31/12  2356   First MD Initiated Contact with Patient 08/01/12 0124      Chief Complaint  Patient presents with  . Abdominal Pain    (Consider location/radiation/quality/duration/timing/severity/associated sxs/prior treatment) HPI Comments: 41 year old female who complains of having watery diarrhea for the last 2 days. She denies any nausea or vomiting, she has mild abdominal cramping, nothing seems to make this better or worse, it has been persistent. She had several children with similar symptoms over the last 2 days as well. She has not had any medications prior to arrival. She denies fever, chills, cough, nausea, vomiting, shortness of breath, back pain, chest pain, swelling, rashes, dysuria. She denies any recent travel or antibiotic use or exposure to hospitalized patient's  Patient is a 41 y.o. female presenting with abdominal pain. The history is provided by the patient.  Abdominal Pain The primary symptoms of the illness include abdominal pain.    Past Medical History  Diagnosis Date  . Iron deficiency anemia, unspecified   . Hypertension   . Obesity     History reviewed. No pertinent past surgical history.  No family history on file.  History  Substance Use Topics  . Smoking status: Never Smoker   . Smokeless tobacco: Not on file  . Alcohol Use: No    OB History    Grav Para Term Preterm Abortions TAB SAB Ect Mult Living                  Review of Systems  Gastrointestinal: Positive for abdominal pain.  All other systems reviewed and are negative.    Allergies  Review of patient's allergies indicates no known allergies.  Home Medications   Current Outpatient Rx  Name  Route  Sig  Dispense  Refill  . LISINOPRIL-HYDROCHLOROTHIAZIDE 20-25 MG PO TABS   Oral   Take 1 tablet by mouth daily.         Marland Kitchen DIPHENOXYLATE-ATROPINE 2.5-0.025 MG PO TABS   Oral   Take 1 tablet by mouth 4 (four) times daily  as needed for diarrhea or loose stools.   30 tablet   0   . ONDANSETRON 4 MG PO TBDP   Oral   Take 1 tablet (4 mg total) by mouth every 8 (eight) hours as needed for nausea.   10 tablet   0     BP 142/88  Pulse 73  Temp 97.8 F (36.6 C) (Oral)  Resp 20  SpO2 99%  Physical Exam  Nursing note and vitals reviewed. Constitutional: She appears well-developed and well-nourished. No distress.  HENT:  Head: Normocephalic and atraumatic.  Mouth/Throat: Oropharynx is clear and moist. No oropharyngeal exudate.  Eyes: Conjunctivae normal and EOM are normal. Pupils are equal, round, and reactive to light. Right eye exhibits no discharge. Left eye exhibits no discharge. No scleral icterus.  Neck: Normal range of motion. Neck supple. No JVD present. No thyromegaly present.  Cardiovascular: Normal rate, regular rhythm, normal heart sounds and intact distal pulses.  Exam reveals no gallop and no friction rub.   No murmur heard. Pulmonary/Chest: Effort normal and breath sounds normal. No respiratory distress. She has no wheezes. She has no rales.  Abdominal: Soft. Bowel sounds are normal. She exhibits no distension and no mass. There is no tenderness.  Musculoskeletal: Normal range of motion. She exhibits no edema and no tenderness.  Lymphadenopathy:    She has no cervical adenopathy.  Neurological:  She is alert. Coordination normal.  Skin: Skin is warm and dry. No rash noted. No erythema.  Psychiatric: She has a normal mood and affect. Her behavior is normal.    ED Course  Procedures (including critical care time)  Labs Reviewed  URINALYSIS, ROUTINE W REFLEX MICROSCOPIC - Abnormal; Notable for the following:    APPearance TURBID (*)     Bilirubin Urine SMALL (*)     Ketones, ur 40 (*)     Leukocytes, UA TRACE (*)     All other components within normal limits  URINE MICROSCOPIC-ADD ON - Abnormal; Notable for the following:    Squamous Epithelial / LPF MANY (*)     Bacteria, UA MANY  (*)     Casts GRANULAR CAST (*)     All other components within normal limits  URINE CULTURE   No results found.   1. Diarrhea   2. Dehydration   3. Abdominal cramping       MDM  At this time the patient has normal bowel sounds, nontender abdomen, clear heart and lung sounds and no tachycardia. She has been having persistent diarrhea which is likely infectious in related to her children's diarrheal illness as well. Check urinalysis to gauge dehydration, otherwise patient can be treated for symptomatic diarrhea, oral fluid hydration preferred.  Patient stable at this time, IV fluids making patient feel better, will furnish with Zofran and Lomotil for home.  Urinalysis reveals mild ketonuria, high specific gravity, likely contaminated sample, culture ordered.    Vida Roller, MD 08/01/12 1610  Vida Roller, MD 08/01/12 [phone]

## 2012-08-01 NOTE — ED Notes (Signed)
Patient here with c/o having stomach virus.  Patient states that her stomach started hurting and she is having diarrhea.

## 2012-08-02 LAB — URINE CULTURE: Colony Count: 75000

## 2012-09-12 ENCOUNTER — Other Ambulatory Visit: Payer: Self-pay | Admitting: Internal Medicine

## 2012-09-12 DIAGNOSIS — Z1231 Encounter for screening mammogram for malignant neoplasm of breast: Secondary | ICD-10-CM

## 2012-10-11 ENCOUNTER — Ambulatory Visit: Payer: 59

## 2012-10-15 ENCOUNTER — Ambulatory Visit
Admission: RE | Admit: 2012-10-15 | Discharge: 2012-10-15 | Disposition: A | Discharge status: Home / Self Care | Payer: 59 | Source: Ambulatory Visit | Attending: Internal Medicine | Admitting: Internal Medicine

## 2012-10-15 DIAGNOSIS — Z1231 Encounter for screening mammogram for malignant neoplasm of breast: Secondary | ICD-10-CM

## 2013-09-14 ENCOUNTER — Encounter (HOSPITAL_COMMUNITY): Payer: Self-pay | Admitting: Emergency Medicine

## 2013-09-14 ENCOUNTER — Emergency Department (HOSPITAL_COMMUNITY)
Admission: EM | Admit: 2013-09-14 | Discharge: 2013-09-14 | Disposition: A | Discharge status: Home / Self Care | Payer: 59 | Attending: Emergency Medicine | Admitting: Emergency Medicine

## 2013-09-14 ENCOUNTER — Emergency Department (HOSPITAL_COMMUNITY): Payer: 59

## 2013-09-14 DIAGNOSIS — M25562 Pain in left knee: Secondary | ICD-10-CM

## 2013-09-14 DIAGNOSIS — Z79899 Other long term (current) drug therapy: Secondary | ICD-10-CM | POA: Insufficient documentation

## 2013-09-14 DIAGNOSIS — Z862 Personal history of diseases of the blood and blood-forming organs and certain disorders involving the immune mechanism: Secondary | ICD-10-CM | POA: Insufficient documentation

## 2013-09-14 DIAGNOSIS — Z87828 Personal history of other (healed) physical injury and trauma: Secondary | ICD-10-CM | POA: Insufficient documentation

## 2013-09-14 DIAGNOSIS — I10 Essential (primary) hypertension: Secondary | ICD-10-CM | POA: Insufficient documentation

## 2013-09-14 DIAGNOSIS — M25569 Pain in unspecified knee: Secondary | ICD-10-CM | POA: Insufficient documentation

## 2013-09-14 MED ORDER — TRAMADOL HCL 50 MG PO TABS: 50.0000 mg | ORAL_TABLET | Freq: Four times a day (QID) | ORAL | Status: DC | PRN

## 2013-09-14 MED ORDER — NAPROXEN 500 MG PO TABS: 500.0000 mg | ORAL_TABLET | Freq: Two times a day (BID) | ORAL | Status: DC

## 2013-09-14 NOTE — ED Notes (Signed)
Pt reports getting out of car earlier this afternoon and experiencing left knee pain. States pain has gotten worse throughout the day. Reports taking Aleve and icing knee without relief.

## 2013-09-14 NOTE — ED Notes (Signed)
Patient with right knee pain since earlier today.  Patient states she was getting out of car earlier and it started hurting.  Patient states that it has become stiff and swollen.

## 2013-09-14 NOTE — ED Provider Notes (Signed)
CSN: [id number]     Arrival date & time 09/14/13  2052 History  This chart was scribed for Santiago Glad, PA, working with Gavin Pound. Oletta Lamas, MD, by Ardelia Mems ED Scribe. This patient was seen in room TR06C/TR06C and the patient's care was started at 9:27 PM.   Chief Complaint  Patient presents with  . Knee Injury    The history is provided by the patient. No language interpreter was used.    HPI Comments: Ashley Harrison is a 43 y.o. female who presents to the Emergency Department complaining of constant, moderate left knee pain with associated swelling onset about 7 hours ago. She denies any known injury to have onset her pain and states that the pain onset when she was exiting her car. She states that she has a history of intermittent right knee pain at baseline, from an old injury, but no prior history of left knee pain. She reports that she has taken Aleve with mild relief. She states that she has not noticed any redness or warmth to her knee. She denies fever, chills or any other symptoms.  Denies numbness or tingling.   Past Medical History  Diagnosis Date  . Iron deficiency anemia, unspecified   . Hypertension   . Obesity    History reviewed. No pertinent past surgical history. History reviewed. No pertinent family history. History  Substance Use Topics  . Smoking status: Never Smoker   . Smokeless tobacco: Not on file  . Alcohol Use: No   OB History   Grav Para Term Preterm Abortions TAB SAB Ect Mult Living                 Review of Systems  Constitutional: Negative for fever and chills.  Musculoskeletal: Positive for arthralgias (left knee) and joint swelling (left knee).  All other systems reviewed and are negative.   Allergies  Review of patient's allergies indicates no known allergies.  Home Medications   Current Outpatient Rx  Name  Route  Sig  Dispense  Refill  . lisinopril-hydrochlorothiazide (PRINZIDE,ZESTORETIC) 20-25 MG per tablet   Oral   Take 1  tablet by mouth daily.          Triage Vitals: BP 114/70  Pulse 94  Temp(Src) 97.7 F (36.5 C) (Oral)  Resp 20  SpO2 99%  LMP 09/02/2013  Physical Exam  Nursing note and vitals reviewed. Constitutional: She is oriented to person, place, and time. She appears well-developed and well-nourished. No distress.  Morbid obesity  HENT:  Head: Normocephalic and atraumatic.  Eyes: EOM are normal.  Neck: Neck supple. No tracheal deviation present.  Cardiovascular: Normal rate, regular rhythm and normal heart sounds.   No murmur heard. 2+ DP pulse of left.   Pulmonary/Chest: Effort normal and breath sounds normal. No respiratory distress.  Musculoskeletal: Normal range of motion.  Full ROM of the left knee.  Neurological: She is alert and oriented to person, place, and time. No sensory deficit.  Distal sensation of left foot is intact.  Skin: Skin is warm and dry.  No obvious erythema, edema or warmth of the left knee  Psychiatric: She has a normal mood and affect. Her behavior is normal.    ED Course  Procedures (including critical care time)  DIAGNOSTIC STUDIES: Oxygen Saturation is 99% on RA, normal by my interpretation.    COORDINATION OF CARE: 9:32 PM- Discussed plan to obtain an X-ray of pt's left knee. Pt advised of plan for treatment and pt agrees.  Labs Review Labs Reviewed - No data to display Imaging Review Dg Knee Complete 4 Views Left  09/14/2013   CLINICAL DATA:  Left-sided knee pain and limited range of motion.  EXAM: LEFT KNEE - COMPLETE 4+ VIEW  COMPARISON:  None.  FINDINGS: There is no evidence of fracture or dislocation. The joint spaces are preserved. Marginal osteophytes are seen arising the lateral and patellofemoral compartments, with mild cortical irregularity along the articular surface of the patella.  No significant joint effusion is seen. The visualized soft tissues are normal in appearance.  IMPRESSION: 1. No evidence of fracture or dislocation. 2. 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   Electronically Signed   By: Roanna RaiderJeffery  Chang M.D.   On: 09/14/2013 21:56    EKG Interpretation   None       MDM   1. Left knee pain    Patient presenting with left knee pain.  No known injury or trauma.  No signs of infection.  Xray showing mild Osteoarthritis.  Patient given ACE wrap and discharged home.  Patient instructed to follow up with PCP.  Return precautions given.  I personally performed the services described in this documentation, which was scribed in my presence. The recorded information has been reviewed and is accurate.     Santiago GladHeather Antwuan Eckley, PA-C 09/14/13 2342

## 2013-09-14 NOTE — Discharge Instructions (Signed)
Be sure to read and understand instructions below prior to leaving the hospital. Use your pain medication as prescribed and do not operate heavy machinery while on pain medication.     TREATMENT  Rest, ice, elevation, and compression are the basic modes of treatment.   Apply ice to the sore area for 15 to 20 minutes, 3 to 4 times per day. Do this while you are awake for the first 2 days, or as directed. This can be stopped when the swelling goes away. Put the ice in a plastic bag and place a towel between the bag of ice and your skin.  Keep your leg elevated when possible to lessen swelling.  If your caregiver recommends crutches, use them as instructed for 1 week. Then, you may walk as tolerated.  Do not drive a vehicle on pain medication. ACTIVITY:            - Weight bearing as tolerated            - Exercises should be limited to pain free range of motion                SEEK MEDICAL CARE IF:  You have an increase in bruising, swelling, or pain.  Your toes feel cold.  Pain relief is not achieved with medications.  EMERGENCY:: Your toes are numb or blue or you have severe pain.  You notice redness, swelling, warmth or increasing pain in your knee.  An unexplained oral temperature above 102 F (38.9 C) develops.  COLD THERAPY DIRECTIONS:  Ice or gel packs can be used to reduce both pain and swelling. Ice is the most helpful within the first 24 to 48 hours after an injury or flareup from overusing a muscle or joint.  Ice is effective, has very few side effects, and is safe for most people to use.   If you expose your skin to cold temperatures for too long or without the proper protection, you can damage your skin or nerves. Watch for signs of skin damage due to cold.   HOME CARE INSTRUCTIONS  Follow these tips to use ice and cold packs safely.  Place a dry or damp towel between the ice and skin. A damp towel will cool the skin more quickly, so you may need to shorten the time that the  ice is used.  For a more rapid response, add gentle compression to the ice.  Ice for no more than 10 to 20 minutes at a time. The bonier the area you are icing, the less time it will take to get the benefits of ice.  Check your skin after 5 minutes to make sure there are no signs of a poor response to cold or skin damage.  Rest 20 minutes or more in between uses.  Once your skin is numb, you can end your treatment. You can test numbness by very lightly touching your skin. The touch should be so light that you do not see the skin dimple from the pressure of your fingertip. When using ice, most people will feel these normal sensations in this order: cold, burning, aching, and numbness.  Do not use ice on someone who cannot communicate their responses to pain, such as small children or people with dementia.   HOW TO MAKE AN ICE PACK  To make an ice pack, do one of the following:  Place crushed ice or a bag of frozen vegetables in a sealable plastic bag. Squeeze out the excess  air. Place this bag inside another plastic bag. Slide the bag into a pillowcase or place a damp towel between your skin and the bag.  Mix 3 parts water with 1 part rubbing alcohol. Freeze the mixture in a sealable plastic bag. When you remove the mixture from the freezer, it will be slushy. Squeeze out the excess air. Place this bag inside another plastic bag. Slide the bag into a pillowcase or place a damp towel between your s

## 2013-09-15 NOTE — ED Provider Notes (Signed)
Medical screening examination/treatment/procedure(s) were performed by non-physician practitioner and as supervising physician I was immediately available for consultation/collaboration.  EKG Interpretation   None         Karrie Fluellen Y. Loki Wuthrich, MD 09/15/13 2008 

## 2014-01-28 ENCOUNTER — Encounter (HOSPITAL_COMMUNITY): Payer: Self-pay | Admitting: Pharmacy Technician

## 2014-02-03 ENCOUNTER — Encounter (HOSPITAL_COMMUNITY): Payer: Self-pay

## 2014-02-03 ENCOUNTER — Encounter (HOSPITAL_COMMUNITY)
Admission: RE | Admit: 2014-02-03 | Discharge: 2014-02-03 | Disposition: A | Discharge status: Home / Self Care | Payer: 59 | Source: Ambulatory Visit | Attending: Obstetrics and Gynecology | Admitting: Obstetrics and Gynecology

## 2014-02-03 LAB — CBC
HEMATOCRIT: 26.8 % — AB (ref 36.0–46.0)
HEMOGLOBIN: 7.7 g/dL — AB (ref 12.0–15.0)
MCH: 18.6 pg — ABNORMAL LOW (ref 26.0–34.0)
MCHC: 28.7 g/dL — AB (ref 30.0–36.0)
MCV: 64.6 fL — ABNORMAL LOW (ref 78.0–100.0)
Platelets: 440 10*3/uL — ABNORMAL HIGH (ref 150–400)
RBC: 4.15 MIL/uL (ref 3.87–5.11)
RDW: 19 % — ABNORMAL HIGH (ref 11.5–15.5)
WBC: 8.6 10*3/uL (ref 4.0–10.5)

## 2014-02-03 LAB — COMPREHENSIVE METABOLIC PANEL
ALK PHOS: 86 U/L (ref 39–117)
ALT: 8 U/L (ref 0–35)
AST: 10 U/L (ref 0–37)
Albumin: 3.1 g/dL — ABNORMAL LOW (ref 3.5–5.2)
BILIRUBIN TOTAL: 0.4 mg/dL (ref 0.3–1.2)
BUN: 9 mg/dL (ref 6–23)
CHLORIDE: 102 meq/L (ref 96–112)
CO2: 24 meq/L (ref 19–32)
Calcium: 9.2 mg/dL (ref 8.4–10.5)
Creatinine, Ser: 0.69 mg/dL (ref 0.50–1.10)
GLUCOSE: 99 mg/dL (ref 70–99)
POTASSIUM: 3.8 meq/L (ref 3.7–5.3)
Sodium: 139 mEq/L (ref 137–147)
TOTAL PROTEIN: 6.6 g/dL (ref 6.0–8.3)

## 2014-02-03 NOTE — Pre-Procedure Instructions (Signed)
Left message for Asher Muir at Dr. Berenda Morale office

## 2014-02-03 NOTE — Patient Instructions (Signed)
Your procedure is scheduled on:  Thursday, February 06, 2014  Enter through the [employer] of [facility] at: 6:00am  Pick up the phone at the desk and dial [phone].  Call this number if you have problems the morning of surgery: [phone].  Remember: Do NOT eat food: After midnight Wednesday Do NOT drink clear liquids after: After midnight Wednesday  Take these medicines the morning of surgery with a SIP OF WATER: Lisinopril-Hydrochlorothiazide  Do NOT wear jewelry (body piercing), metal hair clips/bobby pins, make-up, or nail polish. Do NOT wear lotions, powders, or perfumes.  You may wear deoderant. Do NOT shave for 48 hours prior to surgery. Do NOT bring valuables to the hospital. Contacts, dentures, or bridgework may not be worn into surgery.  Have a responsible adult drive you home and stay with you for 24 hours after your procedure

## 2014-02-03 NOTE — Pre-Procedure Instructions (Signed)
Dr. Malen Gauze made aware of patient 7.7 hgb orders received

## 2014-02-05 NOTE — Anesthesia Preprocedure Evaluation (Signed)
Anesthesia Evaluation  Patient identified by MRN, date of birth, ID band Patient awake    Reviewed: Allergy & Precautions, H&P , NPO status , Patient's Chart, lab work & pertinent test results  Airway Mallampati: II TM Distance: >3 FB Neck ROM: Full    Dental  (+) Dental Advisory Given   Pulmonary neg pulmonary ROS,  breath sounds clear to auscultation        Cardiovascular hypertension, Pt. on medications Rhythm:Regular Rate:Normal     Neuro/Psych negative neurological ROS  negative psych ROS   GI/Hepatic negative GI ROS, Neg liver ROS,   Endo/Other  Morbid obesity  Renal/GU negative Renal ROS     Musculoskeletal negative musculoskeletal ROS (+)   Abdominal (+) + obese,   Peds  Hematology negative hematology ROS (+) anemia ,   Anesthesia Other Findings   Reproductive/Obstetrics negative OB ROS                           Anesthesia Physical Anesthesia Plan  ASA: III  Anesthesia Plan: MAC   Post-op Pain Management:    Induction: Intravenous  Airway Management Planned:   Additional Equipment:   Intra-op Plan:   Post-operative Plan:   Informed Consent: I have reviewed the patients History and Physical, chart, labs and discussed the procedure including the risks, benefits and alternatives for the proposed anesthesia with the patient or authorized representative who has indicated his/her understanding and acceptance.   Dental advisory given  Plan Discussed with: CRNA  Anesthesia Plan Comments:         Anesthesia Quick Evaluation

## 2014-02-05 NOTE — H&P (Signed)
Ashley Harrison is an 43 y.o. female. G2P2 who is  here for a scheduled hysteroscopy and novasure ablation for heavy menses resulting in chronic anemia.  Hgb runs 7-9.0 but she has been unable to tolerate oral iron therapy.  SHe had an EMB which was benign and a saline infused [place] which showed a normal cavity but some small fibroids which impinged close to the cavity.  She has a history significant for postpartum cardiomyopathy but this resolved completely and an echocardiogram last year showed normal EF.  She has had a BTL in the past.  Pertinent Gynecological History: Menses:regular q 26 days and heavy  OB History: C/S x 2   Menstrual History:  No LMP recorded.    Past Medical History  Diagnosis Date  . Iron deficiency anemia, unspecified   . Hypertension   . Obesity     Past Surgical History  Procedure Laterality Date  . Cesarean section    . Tubal ligation    . Wisdom tooth extraction      No family history on file.  Social History:  reports that she has never smoked. She has never used smokeless tobacco. She reports that she does not drink alcohol or use illicit drugs.  Allergies: No Known Allergies  No prescriptions prior to admission    Review of Systems  Constitutional: Negative for malaise/fatigue.    There were no vitals taken for this visit. Physical Exam  Constitutional: She appears well-developed and well-nourished.  Cardiovascular: Normal rate and regular rhythm.   Respiratory: Effort normal and breath sounds normal.  GI: Soft.  Old incision  Neurological: She is alert.  Psychiatric: She has a normal mood and affect.    No results found for this or any previous visit (from the past 24 hour(s)).  No results found.  Assessment/Plan: The patient was counseled on the novasure procedure in detail. The risks of bleeding and infection and possible uterine perforation were reviewed. We discussed that the procedure will usually reduce bleeding  significantly, but may not eliminate periods. We also discussed that she should not perform this procedure if she desires any future pregnancies. She plans no future childbearing and has a contraceptive method in place as the procedure is not birth control (tubal ligation) We discussed office-based and hospital-based procedure and she desires to proceed in the hospital.    Oliver Pila 02/05/2014, 10:00 PM

## 2014-02-06 ENCOUNTER — Encounter (HOSPITAL_COMMUNITY): Payer: Self-pay

## 2014-02-06 ENCOUNTER — Encounter (HOSPITAL_COMMUNITY): Payer: 59 | Admitting: Anesthesiology

## 2014-02-06 ENCOUNTER — Ambulatory Visit (HOSPITAL_COMMUNITY)
Admission: RE | Admit: 2014-02-06 | Discharge: 2014-02-06 | Disposition: A | Discharge status: Home / Self Care | Payer: 59 | Source: Ambulatory Visit | Attending: Obstetrics and Gynecology | Admitting: Obstetrics and Gynecology

## 2014-02-06 ENCOUNTER — Ambulatory Visit (HOSPITAL_COMMUNITY): Payer: 59 | Admitting: Anesthesiology

## 2014-02-06 ENCOUNTER — Encounter (HOSPITAL_COMMUNITY)
Admission: RE | Disposition: A | Discharge status: Home / Self Care | Payer: Self-pay | Source: Ambulatory Visit | Attending: Obstetrics and Gynecology

## 2014-02-06 DIAGNOSIS — Z9889 Other specified postprocedural states: Secondary | ICD-10-CM

## 2014-02-06 DIAGNOSIS — E669 Obesity, unspecified: Secondary | ICD-10-CM | POA: Insufficient documentation

## 2014-02-06 DIAGNOSIS — D649 Anemia, unspecified: Secondary | ICD-10-CM

## 2014-02-06 DIAGNOSIS — N840 Polyp of corpus uteri: Secondary | ICD-10-CM | POA: Insufficient documentation

## 2014-02-06 DIAGNOSIS — N920 Excessive and frequent menstruation with regular cycle: Secondary | ICD-10-CM | POA: Insufficient documentation

## 2014-02-06 DIAGNOSIS — I10 Essential (primary) hypertension: Secondary | ICD-10-CM | POA: Insufficient documentation

## 2014-02-06 HISTORY — PX: HYSTEROSCOPY WITH NOVASURE: SHX5574

## 2014-02-06 LAB — CBC
HCT: 30.4 % — ABNORMAL LOW (ref 36.0–46.0)
HEMOGLOBIN: 8.8 g/dL — AB (ref 12.0–15.0)
MCH: 18.9 pg — ABNORMAL LOW (ref 26.0–34.0)
MCHC: 28.9 g/dL — ABNORMAL LOW (ref 30.0–36.0)
MCV: 65.2 fL — AB (ref 78.0–100.0)
PLATELETS: 521 10*3/uL — AB (ref 150–400)
RBC: 4.66 MIL/uL (ref 3.87–5.11)
RDW: 19.1 % — ABNORMAL HIGH (ref 11.5–15.5)
WBC: 10.1 10*3/uL (ref 4.0–10.5)

## 2014-02-06 LAB — TYPE AND SCREEN
ABO/RH(D): O POS
Antibody Screen: NEGATIVE

## 2014-02-06 LAB — PREGNANCY, URINE: Preg Test, Ur: NEGATIVE

## 2014-02-06 LAB — ABO/RH
ABO/RH(D): O POS
Weak D: POSITIVE

## 2014-02-06 SURGERY — HYSTEROSCOPY WITH NOVASURE
Anesthesia: Monitor Anesthesia Care | Site: Uterus

## 2014-02-06 MED ORDER — FENTANYL CITRATE 0.05 MG/ML IJ SOLN
INTRAMUSCULAR | Status: AC
  Filled 2014-02-06: qty 2

## 2014-02-06 MED ORDER — LACTATED RINGERS IV SOLN
INTRAVENOUS | Status: DC
  Administered 2014-02-06 (×2): via INTRAVENOUS

## 2014-02-06 MED ORDER — LIDOCAINE HCL (CARDIAC) 20 MG/ML IV SOLN
INTRAVENOUS | Status: AC
  Filled 2014-02-06: qty 5

## 2014-02-06 MED ORDER — KETOROLAC TROMETHAMINE 30 MG/ML IJ SOLN
INTRAMUSCULAR | Status: DC | PRN
  Administered 2014-02-06: 30 mg via INTRAVENOUS

## 2014-02-06 MED ORDER — OXYCODONE HCL 5 MG PO TABS
ORAL_TABLET | ORAL | Status: AC
  Filled 2014-02-06: qty 1

## 2014-02-06 MED ORDER — MIDAZOLAM HCL 5 MG/5ML IJ SOLN
INTRAMUSCULAR | Status: DC | PRN
  Administered 2014-02-06: 2 mg via INTRAVENOUS

## 2014-02-06 MED ORDER — METOCLOPRAMIDE HCL 5 MG/ML IJ SOLN: 10.0000 mg | Freq: Once | INTRAMUSCULAR | Status: DC | PRN

## 2014-02-06 MED ORDER — KETOROLAC TROMETHAMINE 30 MG/ML IJ SOLN
INTRAMUSCULAR | Status: AC
  Filled 2014-02-06: qty 1

## 2014-02-06 MED ORDER — IBUPROFEN 200 MG PO TABS: 600.0000 mg | ORAL_TABLET | Freq: Four times a day (QID) | ORAL | Status: DC | PRN

## 2014-02-06 MED ORDER — LACTATED RINGERS IV SOLN: INTRAVENOUS | Status: DC

## 2014-02-06 MED ORDER — ONDANSETRON HCL 4 MG/2ML IJ SOLN
INTRAMUSCULAR | Status: DC | PRN
  Administered 2014-02-06: 4 mg via INTRAVENOUS

## 2014-02-06 MED ORDER — MEPERIDINE HCL 25 MG/ML IJ SOLN: 6.2500 mg | INTRAMUSCULAR | Status: DC | PRN

## 2014-02-06 MED ORDER — DEXAMETHASONE SODIUM PHOSPHATE 10 MG/ML IJ SOLN
INTRAMUSCULAR | Status: DC | PRN
  Administered 2014-02-06: 10 mg via INTRAVENOUS

## 2014-02-06 MED ORDER — PROPOFOL 10 MG/ML IV BOLUS
INTRAVENOUS | Status: DC | PRN
  Administered 2014-02-06: 200 mg via INTRAVENOUS

## 2014-02-06 MED ORDER — LIDOCAINE HCL 1 % IJ SOLN
INTRAMUSCULAR | Status: DC | PRN
  Administered 2014-02-06: 20 mL

## 2014-02-06 MED ORDER — SILVER NITRATE-POT NITRATE 75-25 % EX MISC
CUTANEOUS | Status: DC | PRN
  Administered 2014-02-06: 1 via TOPICAL

## 2014-02-06 MED ORDER — LIDOCAINE HCL (CARDIAC) 20 MG/ML IV SOLN
INTRAVENOUS | Status: DC | PRN
  Administered 2014-02-06: 50 mg via INTRAVENOUS

## 2014-02-06 MED ORDER — LACTATED RINGERS IR SOLN
Status: DC | PRN
  Administered 2014-02-06: 1

## 2014-02-06 MED ORDER — MIDAZOLAM HCL 2 MG/2ML IJ SOLN
INTRAMUSCULAR | Status: AC
  Filled 2014-02-06: qty 2

## 2014-02-06 MED ORDER — PROPOFOL 10 MG/ML IV EMUL
INTRAVENOUS | Status: AC
  Filled 2014-02-06: qty 20

## 2014-02-06 MED ORDER — OXYCODONE HCL 5 MG PO TABS
5.0000 mg | ORAL_TABLET | Freq: Once | ORAL | Status: AC | PRN
  Administered 2014-02-06: 5 mg via ORAL

## 2014-02-06 MED ORDER — DEXAMETHASONE SODIUM PHOSPHATE 10 MG/ML IJ SOLN
INTRAMUSCULAR | Status: AC
  Filled 2014-02-06: qty 1

## 2014-02-06 MED ORDER — FENTANYL CITRATE 0.05 MG/ML IJ SOLN
25.0000 ug | INTRAMUSCULAR | Status: DC | PRN
  Administered 2014-02-06 (×4): 25 ug via INTRAVENOUS

## 2014-02-06 MED ORDER — ONDANSETRON HCL 4 MG/2ML IJ SOLN
INTRAMUSCULAR | Status: AC
  Filled 2014-02-06: qty 2

## 2014-02-06 MED ORDER — LIDOCAINE HCL 1 % IJ SOLN
INTRAMUSCULAR | Status: AC
  Filled 2014-02-06: qty 20

## 2014-02-06 MED ORDER — OXYCODONE HCL 5 MG/5ML PO SOLN: 5.0000 mg | Freq: Once | ORAL | Status: AC | PRN

## 2014-02-06 MED ORDER — FENTANYL CITRATE 0.05 MG/ML IJ SOLN
INTRAMUSCULAR | Status: DC | PRN
  Administered 2014-02-06 (×2): 50 ug via INTRAVENOUS

## 2014-02-06 SURGICAL SUPPLY — 19 items
ABLATOR ENDOMETRIAL BIPOLAR (ABLATOR) IMPLANT
CANISTER SUCT 3000ML (MISCELLANEOUS) ×2 IMPLANT
CATH ROBINSON RED A/P 16FR (CATHETERS) ×2 IMPLANT
CLOTH BEACON ORANGE TIMEOUT ST (SAFETY) ×2 IMPLANT
CONTAINER PREFILL 10% NBF 60ML (FORM) ×4 IMPLANT
DRAPE HYSTEROSCOPY (DRAPE) ×2 IMPLANT
DRSG TELFA 3X8 NADH (GAUZE/BANDAGES/DRESSINGS) ×2 IMPLANT
GLOVE BIO SURGEON STRL SZ 6.5 (GLOVE) ×4 IMPLANT
GOWN STRL REUS W/TWL LRG LVL3 (GOWN DISPOSABLE) ×4 IMPLANT
NDL SPNL 22GX3.5 QUINCKE BK (NEEDLE) ×1 IMPLANT
NEEDLE SPNL 22GX3.5 QUINCKE BK (NEEDLE) ×2 IMPLANT
PACK VAGINAL MINOR WOMEN LF (CUSTOM PROCEDURE TRAY) ×2 IMPLANT
PAD DRESSING TELFA 3X8 NADH (GAUZE/BANDAGES/DRESSINGS) ×1 IMPLANT
PAD OB MATERNITY 4.3X12.25 (PERSONAL CARE ITEMS) ×2 IMPLANT
SET TUBING HYSTEROSCOPY 2 NDL (TUBING) IMPLANT
SYR CONTROL 10ML LL (SYRINGE) ×2 IMPLANT
TOWEL OR 17X24 6PK STRL BLUE (TOWEL DISPOSABLE) ×4 IMPLANT
TUBE HYSTEROSCOPY W Y-CONNECT (TUBING) IMPLANT
WATER STERILE IRR 1000ML POUR (IV SOLUTION) ×2 IMPLANT

## 2014-02-06 NOTE — Transfer of Care (Signed)
Immediate Anesthesia Transfer of Care Note  Patient: Ashley Harrison  Procedure(s) Performed: Procedure(s) with comments: HYSTEROSCOPY WITH NOVASURE (N/A) - 1hr OR time  Patient Location: PACU  Anesthesia Type:General  Level of Consciousness: sedated  Airway & Oxygen Therapy: Patient Spontanous Breathing and Patient connected to nasal cannula oxygen  Post-op Assessment: Report given to PACU RN and Post -op Vital signs reviewed and stable  Post vital signs: stable  Complications: No apparent anesthesia complications

## 2014-02-06 NOTE — Anesthesia Postprocedure Evaluation (Signed)
  Anesthesia Post-op Note  Patient: Ashley Harrison  Procedure(s) Performed: Procedure(s) with comments: HYSTEROSCOPY WITH NOVASURE (N/A) - 1hr OR time  Patient Location: PACU  Anesthesia Type:General  Level of Consciousness: awake, alert  and oriented  Airway and Oxygen Therapy: Patient Spontanous Breathing  Post-op Pain: mild  Post-op Assessment: Post-op Vital signs reviewed, Patient's Cardiovascular Status Stable, Respiratory Function Stable, Patent Airway, No signs of Nausea or vomiting and Pain level controlled  Post-op Vital Signs: Reviewed and stable  Last Vitals:  Filed Vitals:   02/06/14 0900  BP: 113/59  Pulse: 77  Temp: 36.8 C  Resp: 17    Complications: No apparent anesthesia complications

## 2014-02-06 NOTE — Progress Notes (Signed)
Patient ID: Ashley Harrison, female   DOB: 1971-03-21, 43 y.o.   MRN: [id number] Per pt no changes in dictated H&P.  SHe denies any symptoms of anemia.  We discussed blood transfusion in detail and the risks and benefits if it was needed.  Pt agreeable.  Ready to proceed.

## 2014-02-06 NOTE — Discharge Instructions (Addendum)
DISCHARGE INSTRUCTIONS: HYSTEROSCOPY / ENDOMETRIAL ABLATION The following instructions have been prepared to help you care for yourself upon your return home.  Personal hygiene:  Use sanitary pads for vaginal drainage, not tampons.  Shower the day after your procedure.  NO tub baths, pools or Jacuzzis for 2-3 weeks.  Wipe front to back after using the bathroom.  Activity and limitations:  Do NOT drive or operate any equipment for 24 hours. The effects of anesthesia are still present and drowsiness may result.  Do NOT rest in bed all day.  Walking is encouraged.  Walk up and down stairs slowly.  You may resume your normal activity in one to two days or as indicated by your physician. Sexual activity: NO intercourse for at least 2 weeks after the procedure, or as indicated by your Doctor.  Diet: Eat a light meal as desired this evening. You may resume your usual diet tomorrow.  Return to Work: You may resume your work activities in one to two days or as indicated by Therapist, sports.  What to expect after your surgery: Expect to have vaginal bleeding/discharge for 2-3 days and spotting for up to 10 days. It is not unusual to have soreness for up to 1-2 weeks. You may have a slight burning sensation when you urinate for the first day. Mild cramps may continue for a couple of days. You may have a regular period in 2-6 weeks.  Call your doctor for any of the following:  Excessive vaginal bleeding or clotting, saturating and changing one pad every hour.  Inability to urinate 6 hours after discharge from hospital.  Pain not relieved by pain medication.  Fever of 100.4 F or greater.  Unusual vaginal discharge or odor.  Return to office _________________Call for an appointment ___________________ Patients signature: ______________________ Nurses signature ________________________  Post Anesthesia Care Unit [phone]     DO NOT TAKE ANY IBUPROFEN PRODUCTS UNTIL 2 PM  TODAY.  YOU RECEIVED A SIMILAR MEDICATION IN THE OPERATING ROOM TODAY.

## 2014-02-06 NOTE — Op Note (Signed)
Operative report  Preoperative diagnosis Menorrhagia Chronic anemia  Postoperative diagnosis Same  Procedure Hysteroscopy with NovaSure ablation and endometrial sampling  Surgeon Dr. Huel Cote  Anesthesia LMA and paracervical block  Findings The uterine cavity was wide and long.  Width of 4.3 and a length of 6.5-7.  There were no intracavitary polyps or fibroids, the lining was fluffy. Cervical length was 5 cm.  Fluids Estimated blood loss less than 50 cc Urine output 50 cc straight catheter prior procedure IV fluids 800 cc LR Hysteroscopic deficit 20cc  Specimen Endometrial curettings sent to pathology  Procedure note Patient was taken to the operating room where LMA anesthesia was obtained without difficulty she was then prepped and draped in the normal sterile fashion in the dorsal lithotomy position. An appropriate time out was performed. A speculum was then placed within the vagina and the cervix identified and injected with 1% plain lidocaine and a paracervical block and on the anterior lip. A total of 20 cc of local was used. A CO2 tenaculum and grasped anterior lip of the cervix and the uterus was sequentially dilated to a Pratt dilator of 23. The cervical measurements and cavity measurements were performed. The hysteroscope was then introduced into the uterine cavity and the cavity itself appeared normal and though quite large and endometrium proliferative. A gentle curetting was done of the endometrial cavity and the tissue sent to pathology.  The cavity length was 6.5-7 so the NovaSure unit was set at its maximum length of 6.5. The hysteroscope was removed and the NovaSure unit placed within the uterine cavity. A cavity width of 4.3 was then obtained. The cervix it was then placed and test of the or performed and passed. The NovaSure unit was then activated for a total treatment time of 70 seconds. The unit was then withdrawn and the hysteroscope reintroduced into  the uterine cavity. There appeared to be a very good treatment of the upper two thirds of the uterine fundus. In the lower uterine segment there was some viable endometrium noted and this was likely due to the large size of the cavity. The instruments were then withdrawn from the uterus and the tenaculum site treated with silver nitrate. There is no active bleeding noted and the patient was then awakened and taken to the recovery room in good condition

## 2014-02-07 ENCOUNTER — Encounter (HOSPITAL_COMMUNITY): Payer: Self-pay | Admitting: Obstetrics and Gynecology

## 2014-07-20 ENCOUNTER — Ambulatory Visit (INDEPENDENT_AMBULATORY_CARE_PROVIDER_SITE_OTHER): Payer: 59 | Admitting: Internal Medicine

## 2014-07-20 VITALS — BP 126/86 | HR 84 | Temp 98.3°F | Resp 16 | Ht 64.0 in | Wt 281.0 lb

## 2014-07-20 DIAGNOSIS — J069 Acute upper respiratory infection, unspecified: Secondary | ICD-10-CM

## 2014-07-20 NOTE — Addendum Note (Signed)
Addended by: Tonye PearsonOLITTLE, Sheridan Hew P on: 07/20/2014 10:18 AM   Modules accepted: Level of Service

## 2014-07-20 NOTE — Progress Notes (Signed)
   Subjective:    Patient ID: Ashley Harrison, female    DOB: [birth year], 43 y.o.   MRN: [id number]  HPI Chief Complaint  Patient presents with  . Ear Pain    right     This chart was scribed for Ellamae Siaobert Doolittle, MD by Andrew Auaven Small, ED Scribe. This patient was seen in room 8 and the patient's care was started at 9:25 AM.  HPI Comments: Ashley Harrison is a 43 y.o. female who presents to the [facility] complaining of right otalgia. Pt states last night she had the sensation of lump in her throat with associated trouble swallowing and congestion. Pt reports initial trouble sleeping due to pressure in her throat but woke up this morning with pressure alleviated.  Pt ate a hot dog from [employer] for dinner last night. She denies fever and chills. Pt denies allergies this time a year.   Past Medical History  Diagnosis Date  . Iron deficiency anemia, unspecified   . Hypertension   . Obesity    No Known Allergies Prior to Admission medications   Medication Sig Start Date End Date Taking? Authorizing Provider  lisinopril-hydrochlorothiazide (PRINZIDE,ZESTORETIC) 20-25 MG per tablet Take 1 tablet by mouth daily.   Yes Historical Provider, MD  ibuprofen (ADVIL,MOTRIN) 200 MG tablet Take 3 tablets (600 mg total) by mouth every 6 (six) hours as needed for moderate pain. 02/06/14   Oliver PilaKathy W Richardson, MD   Review of Systems  Constitutional: Negative for fever and chills.  HENT: Positive for congestion and trouble swallowing.    Objective:   Physical Exam  Constitutional: She is oriented to person, place, and time. She appears well-developed and well-nourished. No distress.  HENT:  Head: Normocephalic and atraumatic.  Right Ear: Hearing, tympanic membrane, external ear and ear canal normal.  Left Ear: Hearing, tympanic membrane, external ear and ear canal normal.  Nose: Nose normal.  Mouth/Throat: Uvula is midline and oropharynx is clear and moist.  Eyes: Conjunctivae  and EOM are normal.  Neck: Neck supple.  Cardiovascular: Normal rate.   Pulmonary/Chest: Effort normal.  Musculoskeletal: Normal range of motion.  Neurological: She is alert and oriented to person, place, and time.  Skin: Skin is warm and dry.  Psychiatric: She has a normal mood and affect. Her behavior is normal.  Nursing note and vitals reviewed.  Filed Vitals:   07/20/14 0836  BP: 126/86  Pulse: 84  Temp: 98.3 F (36.8 C)  Resp: 16    Assessment & Plan:   I personally performed the services described in this documentation, which was scribed in my presence. The recorded information has been reviewed and is accurate. I have completed the patient encounter in its entirety as documented by the scribe, with editing by me where necessary. Robert P. Merla Richesoolittle, M.D.  Resolving cause of throat and ear pain-?viral URI  Sudafed 12hr bid 3-5 d

## 2014-07-20 NOTE — Patient Instructions (Signed)
Sudafed 12 hour---one twice a  day

## 2014-09-13 ENCOUNTER — Encounter (HOSPITAL_COMMUNITY): Payer: Self-pay | Admitting: *Deleted

## 2014-09-13 ENCOUNTER — Emergency Department (HOSPITAL_COMMUNITY)
Admission: EM | Admit: 2014-09-13 | Discharge: 2014-09-13 | Disposition: A | Discharge status: Home / Self Care | Payer: 59 | Source: Home / Self Care | Attending: Emergency Medicine | Admitting: Emergency Medicine

## 2014-09-13 DIAGNOSIS — N390 Urinary tract infection, site not specified: Secondary | ICD-10-CM

## 2014-09-13 LAB — POCT URINALYSIS DIP (DEVICE)
Glucose, UA: 100 mg/dL — AB
NITRITE: POSITIVE — AB
PH: 6 (ref 5.0–8.0)
Protein, ur: >=300 mg/dL — AB
Specific Gravity, Urine: 1.025 (ref 1.005–1.030)
Urobilinogen, UA: 1 mg/dL (ref 0.0–1.0)

## 2014-09-13 LAB — POCT PREGNANCY, URINE: Preg Test, Ur: NEGATIVE

## 2014-09-13 MED ORDER — PHENAZOPYRIDINE HCL 200 MG PO TABS: 200.0000 mg | ORAL_TABLET | Freq: Three times a day (TID) | ORAL | Status: DC

## 2014-09-13 MED ORDER — CEFUROXIME AXETIL 250 MG PO TABS: 250.0000 mg | ORAL_TABLET | Freq: Two times a day (BID) | ORAL | Status: DC

## 2014-09-13 NOTE — Discharge Instructions (Signed)

## 2014-09-13 NOTE — ED Provider Notes (Signed)
CSN: [id number]     Arrival date & time 09/13/14  1049 History   None    Chief Complaint  Patient presents with  . Urinary Tract Infection   (Consider location/radiation/quality/duration/timing/severity/associated sxs/prior Treatment) HPI       44 year old female presents complaining of possible UTI. She has dysuria, urinary frequency, urinary urgency. Symptoms have been present since this morning. No abdominal pain or flank pain. No fever, chills, NVD. No recent travel or sick contacts. No recent antibiotic use. No recent hospitalizations  Past Medical History  Diagnosis Date  . Iron deficiency anemia, unspecified   . Hypertension   . Obesity    Past Surgical History  Procedure Laterality Date  . Cesarean section  01/2001, 05/2004     x 2  . Tubal ligation  2005  . Wisdom tooth extraction  1988  . Hysteroscopy with novasure N/A 02/06/2014    Procedure: HYSTEROSCOPY WITH NOVASURE;  Surgeon: Oliver Pila, MD;  Location: [facility];  Service: Gynecology;  Laterality: N/A;  1hr OR time   Family History  Problem Relation Age of Onset  . Diabetes Mother   . Hyperlipidemia Father   . Hypertension Father    History  Substance Use Topics  . Smoking status: Never Smoker   . Smokeless tobacco: Never Used  . Alcohol Use: No   OB History    No data available     Review of Systems  Constitutional: Positive for chills. Negative for fever.  Gastrointestinal: Negative for nausea, vomiting, abdominal pain and diarrhea.  Genitourinary: Positive for dysuria, urgency and frequency. Negative for hematuria, flank pain and vaginal discharge.  All other systems reviewed and are negative.   Allergies  Review of patient's allergies indicates no known allergies.  Home Medications   Prior to Admission medications   Medication Sig Start Date End Date Taking? Authorizing Provider  lisinopril-hydrochlorothiazide (PRINZIDE,ZESTORETIC) 20-25 MG per tablet Take 1 tablet by mouth daily.   Yes  Historical Provider, MD  cefUROXime (CEFTIN) 250 MG tablet Take 1 tablet (250 mg total) by mouth 2 (two) times daily with a meal. 09/13/14   Graylon Good, PA-C  ibuprofen (ADVIL,MOTRIN) 200 MG tablet Take 3 tablets (600 mg total) by mouth every 6 (six) hours as needed for moderate pain. 02/06/14   Oliver Pila, MD  phenazopyridine (PYRIDIUM) 200 MG tablet Take 1 tablet (200 mg total) by mouth 3 (three) times daily. 09/13/14   Adrian Blackwater Bishoy Cupp, PA-C   BP 131/86 mmHg  Pulse 80  Temp(Src) 98.4 F (36.9 C) (Oral)  Resp 16  SpO2 100%  LMP 09/09/2014 Physical Exam  Constitutional: She is oriented to person, place, and time. Vital signs are normal. She appears well-developed and well-nourished. No distress.  HENT:  Head: Normocephalic and atraumatic.  Pulmonary/Chest: Effort normal. No respiratory distress.  Abdominal: Soft. She exhibits no distension and no mass. There is no tenderness. There is no rebound, no guarding and no CVA tenderness.  Neurological: She is alert and oriented to person, place, and time. She has normal strength. Coordination normal.  Skin: Skin is warm and dry. No rash noted. She is not diaphoretic.  Psychiatric: She has a normal mood and affect. Judgment normal.  Nursing note and vitals reviewed.   ED Course  Procedures (including critical care time) Labs Review Labs Reviewed  POCT URINALYSIS DIP (DEVICE) - Abnormal; Notable for the following:    Glucose, UA 100 (*)    Bilirubin Urine SMALL (*)    Ketones,  ur TRACE (*)    Hgb urine dipstick LARGE (*)    Protein, ur >=300 (*)    Nitrite POSITIVE (*)    Leukocytes, UA LARGE (*)    All other components within normal limits  URINE CULTURE  POCT PREGNANCY, URINE    Imaging Review No results found.   MDM   1. UTI (lower urinary tract infection)    UTI, treat with Ceftin and Pyridium. Urine culture sent. Follow-up when necessary   Meds ordered this encounter  Medications  . cefUROXime (CEFTIN) 250  MG tablet    Sig: Take 1 tablet (250 mg total) by mouth 2 (two) times daily with a meal.    Dispense:  10 tablet    Refill:  0    Order Specific Question:  Supervising Provider    Answer:  Lorenz CoasterKELLER, DAVID C [id number]]  . phenazopyridine (PYRIDIUM) 200 MG tablet    Sig: Take 1 tablet (200 mg total) by mouth 3 (three) times daily.    Dispense:  6 tablet    Refill:  0    Order Specific Question:  Supervising Provider    Answer:  Lorenz CoasterKELLER, DAVID C [[id number]]       Graylon GoodZachary H Addysen Louth, PA-C 09/13/14 1153

## 2014-09-13 NOTE — ED Notes (Signed)
Hurts to urinate.  Voiding frequently in small amounts onset this AM.  She is on her period, so can't tell if she has hematuria.

## 2014-09-16 LAB — URINE CULTURE: Colony Count: >=100000

## 2014-09-17 NOTE — ED Notes (Signed)
Urine culture:>100,000 colonies E. Coli.  Pt. adequately treated with Ceftin. Vassie MoselleYork, Johnay Mano M 09/17/2014

## 2016-02-17 ENCOUNTER — Encounter: Payer: Self-pay | Admitting: Neurology

## 2016-02-17 ENCOUNTER — Ambulatory Visit (INDEPENDENT_AMBULATORY_CARE_PROVIDER_SITE_OTHER): Payer: 59 | Admitting: Neurology

## 2016-02-17 DIAGNOSIS — R0683 Snoring: Secondary | ICD-10-CM

## 2016-02-17 DIAGNOSIS — R5383 Other fatigue: Secondary | ICD-10-CM

## 2016-02-17 DIAGNOSIS — G4730 Sleep apnea, unspecified: Secondary | ICD-10-CM | POA: Diagnosis not present

## 2016-02-17 DIAGNOSIS — D509 Iron deficiency anemia, unspecified: Secondary | ICD-10-CM | POA: Diagnosis not present

## 2016-02-17 NOTE — Patient Instructions (Signed)
Insomnia Insomnia is a sleep disorder that makes it difficult to fall asleep or to stay asleep. Insomnia can cause tiredness (fatigue), low energy, difficulty concentrating, mood swings, and poor performance at work or school.  There are three different ways to classify insomnia:  Difficulty falling asleep.  Difficulty staying asleep.  Waking up too early in the morning. Any type of insomnia can be long-term (chronic) or short-term (acute). Both are common. Short-term insomnia usually lasts for three months or less. Chronic insomnia occurs at least three times a week for longer than three months. CAUSES  Insomnia may be caused by another condition, situation, or substance, such as:  Anxiety.  Certain medicines.  Gastroesophageal reflux disease (GERD) or other gastrointestinal conditions.  Asthma or other breathing conditions.  Restless legs syndrome, sleep apnea, or other sleep disorders.  Chronic pain.  Menopause. This may include hot flashes.  Stroke.  Abuse of alcohol, tobacco, or illegal drugs.  Depression.  Caffeine.   Neurological disorders, such as Alzheimer disease.  An overactive thyroid (hyperthyroidism). The cause of insomnia may not be known. RISK FACTORS Risk factors for insomnia include:  Gender. Women are more commonly affected than men.  Age. Insomnia is more common as you get older.  Stress. This may involve your professional or personal life.  Income. Insomnia is more common in people with lower income.  Lack of exercise.   Irregular work schedule or night shifts.  Traveling between different time zones. SIGNS AND SYMPTOMS If you have insomnia, trouble falling asleep or trouble staying asleep is the main symptom. This may lead to other symptoms, such as:  Feeling fatigued.  Feeling nervous about going to sleep.  Not feeling rested in the morning.  Having trouble concentrating.  Feeling irritable, anxious, or depressed. TREATMENT   Treatment for insomnia depends on the cause. If your insomnia is caused by an underlying condition, treatment will focus on addressing the condition. Treatment may also include:   Medicines to help you sleep.  Counseling or therapy.  Lifestyle adjustments. HOME CARE INSTRUCTIONS   Take medicines only as directed by your health care provider.  Keep regular sleeping and waking hours. Avoid naps.  Keep a sleep diary to help you and your health care provider figure out what could be causing your insomnia. Include:   When you sleep.  When you wake up during the night.  How well you sleep.   How rested you feel the next day.  Any side effects of medicines you are taking.  What you eat and drink.   Make your bedroom a comfortable place where it is easy to fall asleep:  Put up shades or special blackout curtains to block light from outside.  Use a white noise machine to block noise.  Keep the temperature cool.   Exercise regularly as directed by your health care provider. Avoid exercising right before bedtime.  Use relaxation techniques to manage stress. Ask your health care provider to suggest some techniques that may work well for you. These may include:  Breathing exercises.  Routines to release muscle tension.  Visualizing peaceful scenes.  Cut back on alcohol, caffeinated beverages, and cigarettes, especially close to bedtime. These can disrupt your sleep.  Do not overeat or eat spicy foods right before bedtime. This can lead to digestive discomfort that can make it hard for you to sleep.  Limit screen use before bedtime. This includes:  Watching TV.  Using your smartphone, tablet, and computer.  Stick to a routine. This   can help you fall asleep faster. Try to do a quiet activity, brush your teeth, and go to bed at the same time each night.  Get out of bed if you are still awake after 15 minutes of trying to sleep. Keep the lights down, but try reading or  doing a quiet activity. When you feel sleepy, go back to bed.  Make sure that you drive carefully. Avoid driving if you feel very sleepy.  Keep all follow-up appointments as directed by your health care provider. This is important. SEEK MEDICAL CARE IF:   You are tired throughout the day or have trouble in your daily routine due to sleepiness.  You continue to have sleep problems or your sleep problems get worse. SEEK IMMEDIATE MEDICAL CARE IF:   You have serious thoughts about hurting yourself or someone else.   This information is not intended to replace advice given to you by your health care provider. Make sure you discuss any questions you have with your health care provider.   Document Released: 08/12/2000 Document Revised: 05/06/2015 Document Reviewed: 05/16/2014 Elsevier Interactive Patient Education 2016 [employer]   Please remember to try to maintain good sleep hygiene, which means: Keep a regular sleep and wake schedule, try not to exercise or have a meal within 2 hours of your bedtime, try to keep your bedroom conducive for sleep, that is, cool and dark, without light distractors such as an illuminated alarm clock, and refrain from watching TV right before sleep or in the middle of the night and do not keep the TV or radio on during the night. Also, try not to use or play on electronic devices at bedtime, such as your cell phone, tablet PC or laptop. If you like to read at bedtime on an electronic device, try to dim the background light as much as possible. Do not eat in the middle of the night.   We will request a sleep study.   We will look for leg twitching and snoring or sleep apnea. We will call you with the sleep study results and make a follow up appointment if needed.

## 2016-02-17 NOTE — Progress Notes (Signed)
SLEEP MEDICINE CLINIC   Provider:  Melvyn Novasarmen  Giorgi Debruin, M D  Referring Provider: Dorothyann PengSanders, Robyn, MD Primary Care Physician:  Gwynneth AlimentSANDERS,ROBYN N, MD  Chief Complaint  Patient presents with  . New Patient (Initial Visit)    feels exhausted in morning, never had sleep study    HPI:  Ashley Harrison is a 45 y.o. female , seen here as a referral from Dr. Allyne GeeSanders for a sleep evaluation, In a patient with morbid obesity, Hypertension and Iron deficiency anemia.   Chief complaint according to patient : " I feel not refreshed and only more tired each morning- I question if I even slept"   [name] is a divorced, right-handed African-American female patient of Dr. Lelon PerlaSaunders who complains of a feeling of severe exhaustion and fatigue. Her primary care physician, Dr. Lelon PerlaSaunders, is already treated her for hypertension and workup for anemia. The patient is considered iron deficient. She still have regular menses is a mother of 2 and has no abnormal menstrual bleed. She has not been taking vitamin D or calcium thus far but she is also not having unhealthy habits such as smoking or other tobacco use and she does not drink alcohol. Her hypertension was exacerbated by anxiety and stress but has never been malignant. Occasionally the patient has suffered from sinusitis usually over all to them and winter months. She also has sometimes respiratory infections and pharyngitis. She is currently not affected. Attached to her referral papers for recent laboratory testing. The patient has a borderline low ferritin level under 50 ng/mL her range was 44. Her vitamin D was 10.9 ng which is low, her TSH was in normal range at 0.7 and her hemoglobin A1c was 5.9. She would be considered prediabetic based on the HbA1c. Cholesterol was in normal limits 201 mg/dL triglycerides were normal limits, she had a low hematocrit, low hemoglobin at 30 2. 8/10 MCH was 21.2 platelets were elevated which could indicate a chronic  inflammatory condition. Iron saturation was only 6% and iron availability of serum was 24 g. These tests were taken on Jan 21 2016.   Sleep habits are as follows:  Her usual bedtime is between 10.30 and 11.15 PM, and she goes to sleep promptly. She usually sleeps alone and sometimes her daughter or her son will sneak in. She sleeps on 2 pillows with an elevated head of bed. She prefers to sleep on her side. She usually has a TV on in the background when she sleeps on night. The bedroom is cool- but not quiet and dark.her children have told her that she snored- many a mornings.Her children have not specifically told her if she has apnea or not. Before she got divorced , her former spouse had reported to hear her pausing for breath at night.  She usually has 2 bathroom breaks at night interrupting her sleep and she attributes those to a diuretic component of her antihypertensive medication,  But usually she will fall asleep promptly again. She rises in the morning at about 6 AM, relies on an alarm. She denies any morning headaches, has no diaphoresis or palpitations and no dry mouth,She wakes up and feels exhausted. On weekends she has the opportunity to take a nap in daytime but not on weekdays. His naps last an hour to 2. Her naps are not necessarily more refreshing than her nighttime sleep. She has never used sleep aids.   Sleep medical history and family sleep history:  Mother had OSA, multiple family members are  obese.  No personal history of Sleepwalking, night terrors or enuresis. No TBI , or ENT surgery, no neck trauma.    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 She does not smoke and has never smoked, she does not drink alcohol, she uses caffeine in the morning hours usually a Cappuccino on the right work.  Review of Systems: Out of a complete 14 system review, the patient complains of only the following symptoms, and all other  reviewed systems are negative. Epworth score 4 , Fatigue severity score   47, depression score 2/15   Social History   Social History  . Marital Status: Divorced    Spouse Name: N/A  . Number of Children: N/A  . Years of Education: N/A   Occupational History  . Not on file.   Social History Main Topics  . Smoking status: Never Smoker   . Smokeless tobacco: Never Used  . Alcohol Use: No  . Drug Use: No  . Sexual Activity: Not Currently    Birth Control/ Protection: Surgical   Other Topics Concern  . Not on file   Social History Narrative    Family History  Problem Relation Age of Onset  . Diabetes Mother   . Hyperlipidemia Father   . Hypertension Father     Past Medical History  Diagnosis Date  . Iron deficiency anemia, unspecified   . Hypertension   . Obesity     Past Surgical History  Procedure Laterality Date  . Cesarean section  01/2001, 05/2004     x 2  . Tubal ligation  2005  . Wisdom tooth extraction  1988  . Hysteroscopy with novasure N/A 02/06/2014    Procedure: HYSTEROSCOPY WITH NOVASURE;  Surgeon: Oliver Pila, MD;  Location: [facility];  Service: Gynecology;  Laterality: N/A;  1hr OR time    Current Outpatient Prescriptions  Medication Sig Dispense Refill  . lisinopril-hydrochlorothiazide (PRINZIDE,ZESTORETIC) 20-25 MG per tablet Take 1 tablet by mouth daily.     No current facility-administered medications for this visit.    Allergies as of 02/17/2016  . (No Known Allergies)    Vitals: BP 128/88 mmHg  Pulse 86  Resp 20  Ht  (1.6 m)  Wt 287 lb (130.182 kg)  BMI 50.85 kg/m2 Last Weight:  Wt Readings from Last 1 Encounters:  02/17/16 287 lb (130.182 kg)   ZOX:WRUE mass index is 50.85 kg/(m^2).     Last Height:   Ht Readings from Last 1 Encounters:  02/17/16  (1.6 m)    Physical exam:  General: The patient is awake, alert and appears not in acute distress. The patient is well groomed. Head: Normocephalic, atraumatic.  Neck is supple. Mallampati 5,  neck circumference:16. Nasal airflow patent , TMJ is not evident . Retrognathia is seen. Used braces and retainer as a teenager.  Cardiovascular:  Regular rate and rhythm, without  murmurs or carotid bruit, and without distended neck veins. Respiratory: Lungs are clear to auscultation. Skin:  Without evidence of edema, or rash Trunk: BMI is super obese. The patient's posture is erect.   Neurologic exam : The patient is awake and alert, oriented to place and time.   Memory subjective described as intact.  Attention span & concentration ability appears normal.  Speech is fluent,  without dysarthria, dysphonia or aphasia.  Mood and affect are appropriate.  Cranial nerves: Pupils are equal and briskly reactive to light. Funduscopic exam  without evidence of pallor or edema. Extraocular movements  in vertical and horizontal planes intact and without nystagmus. Visual fields by finger perimetry are intact. Hearing to finger rub intact. Facial sensation intact to fine touch.Facial motor strength is symmetric and tongue and uvula move midline. Shoulder shrug was symmetrical.  Motor exam:  Normal tone, muscle bulk and symmetric strength in all extremities. Sensory:  Fine touch, pinprick and vibration were tested in all extremities. Proprioception tested in the upper extremities was normal. Coordination: Rapid alternating movements /Finger-to-nose maneuver  normal (without evidence of ataxia, dysmetria or tremor) .  Gait and station: Patient walks without assistive device and is able unassisted to climb up to the exam table. Strength within normal limits.  Stance is stable and normal.    Deep tendon reflexes: in the  upper and lower extremities are symmetric and intact. Babinski maneuver response is downgoing.  The patient was advised of the nature of the diagnosed sleep disorder , the treatment options and risks for general a health and wellness arising from not treating  the condition.  I spent more than 30 minutes of face to face time with the patient. Greater than 50% of time was spent in counseling and coordination of care. We have discussed the diagnosis and differential and I answered the patient's questions.     Assessment:  After physical and neurologic examination, review of laboratory studies,  Personal review of imaging studies, reports of other /same  Imaging studies ,  Results of polysomnography/ neurophysiology testing and pre-existing records as far as provided in visit., my assessment is   1) Mrs. El- Amin report secondhand knowledge about sleep apnea as her former spouse and her children now has reported to her that she snores, but she has not woken herself from struggling for air or gasping for breath. She feels however that her sleep does not have the  restorative and refreshing component and quality.  Her risk factors are mostly related to her weight, she does only have a few comorbidities and there is no cardiac disease or stroke in her history.  2) a body mass index exceeds 40, her neck circumference is 16 inches and her Mallampati is grade 5, the uvula is not visible. She also has retrognathia which is another predisposition she cannot influence. Based on these factors I would like for her to undergo a home sleep test to be screened for apnea. If apnea is found I would like to her to come back to the sleep lab for CPAP titration.   Plan:  Treatment plan and additional workup :  HST , no capnography needed.  Snoring, apnea, fatigue and Nocturia, Morbid obesity. Anemia  Start prenatal vitamin to get a low dose of iron.  No restless legs - ferritin ws lower than 50 ng, is a risk factor.   Porfirio Mylar Samira Acero MD  02/17/2016   CC: [name], Md [address] [place], [place] [address]

## 2016-02-29 DIAGNOSIS — I10 Essential (primary) hypertension: Secondary | ICD-10-CM | POA: Diagnosis not present

## 2016-02-29 DIAGNOSIS — T7840XA Allergy, unspecified, initial encounter: Secondary | ICD-10-CM | POA: Diagnosis not present

## 2016-03-01 ENCOUNTER — Emergency Department (HOSPITAL_COMMUNITY)
Admission: EM | Admit: 2016-03-01 | Discharge: 2016-03-01 | Disposition: A | Discharge status: Home / Self Care | Payer: 59 | Attending: Emergency Medicine | Admitting: Emergency Medicine

## 2016-03-01 ENCOUNTER — Encounter (HOSPITAL_COMMUNITY): Payer: Self-pay | Admitting: Emergency Medicine

## 2016-03-01 DIAGNOSIS — T7840XA Allergy, unspecified, initial encounter: Secondary | ICD-10-CM

## 2016-03-01 MED ORDER — FAMOTIDINE 20 MG PO TABS
20.0000 mg | ORAL_TABLET | Freq: Once | ORAL | Status: AC
  Administered 2016-03-01: 20 mg via ORAL
  Filled 2016-03-01: qty 1

## 2016-03-01 MED ORDER — EPINEPHRINE 0.3 MG/0.3ML IJ SOAJ: 0.3000 mg | Freq: Once | INTRAMUSCULAR | Status: DC

## 2016-03-01 MED ORDER — FAMOTIDINE 20 MG PO TABS: 20.0000 mg | ORAL_TABLET | Freq: Two times a day (BID) | ORAL | Status: DC

## 2016-03-01 MED ORDER — PREDNISONE 20 MG PO TABS
60.0000 mg | ORAL_TABLET | Freq: Once | ORAL | Status: AC
  Administered 2016-03-01: 60 mg via ORAL
  Filled 2016-03-01: qty 3

## 2016-03-01 MED ORDER — PREDNISONE 20 MG PO TABS: ORAL_TABLET | ORAL | Status: DC

## 2016-03-01 NOTE — ED Notes (Signed)
Cup of water provided to patient for fluid challenge.  

## 2016-03-01 NOTE — ED Provider Notes (Signed)
CSN: [id number]     Arrival date & time 02/29/16  2357 History  By signing my name below, I, Marisue HumbleMichelle Chaffee, attest that this documentation has been prepared under the direction and in the presence of Tymere Depuy, MD . Electronically Signed: Marisue HumbleMichelle Chaffee, Scribe. 03/01/2016. 2:27 AM.   Chief Complaint  Patient presents with  . Allergic Reaction   Patient is a 45 y.o. female presenting with allergic reaction. The history is provided by the patient. No language interpreter was used.  Allergic Reaction Presenting symptoms: rash   Presenting symptoms: no difficulty swallowing   Rash:    Location: back.   Quality: itchiness     Severity:  Moderate   Onset quality:  Gradual   Timing:  Constant   Progression:  Resolved Severity:  Moderate Prior allergic episodes:  No prior episodes Context: food   Relieved by:  Antihistamines Worsened by:  Nothing tried Ineffective treatments: benadryl made her feel weird.  HPI Comments:  Ashley Harrison is a 45 y.o. female with PMHx of HTN who presents to the Emergency Department complaining of rash on her back onset 1 hour after eating shrimp, rice and vegetables. She had eaten the same food previously without issue. Pt took Benadryl which provided some relief. She went to sleep then began to feel shaky.  Past Medical History  Diagnosis Date  . Iron deficiency anemia, unspecified   . Hypertension   . Obesity    Past Surgical History  Procedure Laterality Date  . Cesarean section  01/2001, 05/2004     x 2  . Tubal ligation  2005  . Wisdom tooth extraction  1988  . Hysteroscopy with novasure N/A 02/06/2014    Procedure: HYSTEROSCOPY WITH NOVASURE;  Surgeon: Oliver PilaKathy W Richardson, MD;  Location: [facility];  Service: Gynecology;  Laterality: N/A;  1hr OR time   Family History  Problem Relation Age of Onset  . Diabetes Mother   . Hyperlipidemia Father   . Hypertension Father    Social History  Substance Use Topics  . Smoking status: Never  Smoker   . Smokeless tobacco: Never Used  . Alcohol Use: No   OB History    No data available     Review of Systems  Constitutional: Negative for fatigue.  HENT: Negative for trouble swallowing.   Respiratory: Negative for shortness of breath.   Cardiovascular: Negative for chest pain.  Skin: Positive for rash.  All other systems reviewed and are negative.   Allergies  Review of patient's allergies indicates no known allergies.  Home Medications   Prior to Admission medications   Medication Sig Start Date End Date Taking? Authorizing Provider  lisinopril-hydrochlorothiazide (PRINZIDE,ZESTORETIC) 20-25 MG per tablet Take 1 tablet by mouth daily.    Historical Provider, MD   BP 147/86 mmHg  Pulse 73  Temp(Src) 98.1 F (36.7 C) (Oral)  Resp 18  Ht 5\' 3"  (1.6 m)  Wt 280 lb (127.007 kg)  BMI 49.61 kg/m2  SpO2 100%  LMP 02/11/2016   Physical Exam  Constitutional: She appears well-developed and well-nourished. No distress.  HENT:  Head: Normocephalic and atraumatic.  Mouth/Throat: Oropharynx is clear and moist.  No swelling of lips, tongue or uvula   Eyes: EOM are normal. Pupils are equal, round, and reactive to light.  Neck: Normal range of motion. Neck supple.  Cardiovascular: Normal rate and regular rhythm.   Pulmonary/Chest: Effort normal and breath sounds normal. No stridor. No respiratory distress. She has no wheezes. She has  no rales.  Abdominal: Soft. Bowel sounds are normal. There is no rebound and no guarding.  Musculoskeletal: Normal range of motion.  Neurological: She is alert. She has normal reflexes.  Skin: Skin is warm and dry. No rash noted.  Psychiatric: She has a normal mood and affect.  Nursing note and vitals reviewed.   ED Course  Procedures  DIAGNOSTIC STUDIES:  Oxygen Saturation is 100% on RA, normal by my interpretation.    COORDINATION OF CARE:  2:57 AM Discussed treatment plan with pt at bedside and pt agreed to plan.  Labs  Review Labs Reviewed - No data to display  Imaging Review No results found. I have personally reviewed and evaluated these images and lab results as part of my medical decision-making.   EKG Interpretation None      MDM   Final diagnoses:  None   Filed Vitals:   03/01/16 0020  BP: 147/86  Pulse: 73  Temp: 98.1 F (36.7 C)  Resp: 18   Results for orders placed or performed during the hospital encounter of 09/13/14  Urine culture  Result Value Ref Range   Specimen Description URINE, CLEAN CATCH    Special Requests NONE    Colony Count      >=100,000 COLONIES/ML Performed at [facility]    Culture      ESCHERICHIA COLI Performed at [facility]    Report Status 09/16/2014 FINAL    Organism ID, Bacteria ESCHERICHIA COLI       Susceptibility   Escherichia coli - MIC*    AMPICILLIN <=2 SENSITIVE Sensitive     CEFAZOLIN <=4 SENSITIVE Sensitive     CEFTRIAXONE <=1 SENSITIVE Sensitive     CIPROFLOXACIN <=0.25 SENSITIVE Sensitive     GENTAMICIN <=1 SENSITIVE Sensitive     LEVOFLOXACIN <=0.12 SENSITIVE Sensitive     NITROFURANTOIN <=16 SENSITIVE Sensitive     TOBRAMYCIN <=1 SENSITIVE Sensitive     TRIMETH/SULFA <=20 SENSITIVE Sensitive     PIP/TAZO <=4 SENSITIVE Sensitive     * ESCHERICHIA COLI  POCT urinalysis dip (device)  Result Value Ref Range   Glucose, UA 100 (A) NEGATIVE mg/dL   Bilirubin Urine SMALL (A) NEGATIVE   Ketones, ur TRACE (A) NEGATIVE mg/dL   Specific Gravity, Urine 1.025 1.005 - 1.030   Hgb urine dipstick LARGE (A) NEGATIVE   pH 6.0 5.0 - 8.0   Protein, ur >=300 (A) NEGATIVE mg/dL   Urobilinogen, UA 1.0 0.0 - 1.0 mg/dL   Nitrite POSITIVE (A) NEGATIVE   Leukocytes, UA LARGE (A) NEGATIVE  Pregnancy, urine POC  Result Value Ref Range   Preg Test, Ur NEGATIVE NEGATIVE   No results found.   Medications  predniSONE (DELTASONE) tablet 60 mg (60 mg Oral Given 03/01/16 0358)  famotidine (PEPCID) tablet 20 mg (20 mg Oral Given  03/01/16 0358)   Improved post medication.    No shellfish, list as allergy.      Medication List    TAKE these medications        EPINEPHrine 0.3 mg/0.3 mL Soaj injection  Commonly known as:  EPI-PEN  Inject 0.3 mLs (0.3 mg total) into the muscle once.     famotidine 20 MG tablet  Commonly known as:  PEPCID  Take 1 tablet (20 mg total) by mouth 2 (two) times daily.     predniSONE 20 MG tablet  Commonly known as:  DELTASONE  3 tabs po day one, then 2 po daily x 4 days  ASK your doctor about these medications        diphenhydrAMINE 25 mg capsule  Commonly known as:  BENADRYL  Take 25 mg by mouth every 6 (six) hours as needed for itching.     lisinopril-hydrochlorothiazide 20-25 MG tablet  Commonly known as:  PRINZIDE,ZESTORETIC  Take 1 tablet by mouth daily.       Strict allergic reaction return precautions given for swelling throat swelling or pain or any concerns.    I personally performed the services described in this documentation, which was scribed in my presence. The recorded information has been reviewed and is accurate.       Cy BlamerApril Jru Pense, MD 03/01/16 [phone]

## 2016-03-01 NOTE — ED Notes (Signed)
Pt st's she had a reaction to something she ate for dinner tonight.  St's she ate at 6pm and and 7pm she was breaking out in a rash and felt like her throat was closing.  Pt st's she took Benadryl and symptoms started to resolve.  Pt st's she went to sleep and woke up short while ago and felt shaky.  No resp distress present at this time.  No rash noted.

## 2016-03-01 NOTE — Discharge Instructions (Signed)

## 2017-01-21 ENCOUNTER — Encounter (HOSPITAL_COMMUNITY): Payer: Self-pay | Admitting: Emergency Medicine

## 2017-01-21 ENCOUNTER — Emergency Department (HOSPITAL_COMMUNITY)
Admission: EM | Admit: 2017-01-21 | Discharge: 2017-01-21 | Disposition: A | Discharge status: Home / Self Care | Payer: 59 | Attending: Emergency Medicine | Admitting: Emergency Medicine

## 2017-01-21 DIAGNOSIS — Z79899 Other long term (current) drug therapy: Secondary | ICD-10-CM | POA: Diagnosis not present

## 2017-01-21 DIAGNOSIS — R319 Hematuria, unspecified: Secondary | ICD-10-CM | POA: Diagnosis not present

## 2017-01-21 DIAGNOSIS — R109 Unspecified abdominal pain: Secondary | ICD-10-CM

## 2017-01-21 DIAGNOSIS — R1032 Left lower quadrant pain: Secondary | ICD-10-CM | POA: Insufficient documentation

## 2017-01-21 LAB — URINALYSIS, ROUTINE W REFLEX MICROSCOPIC
Bacteria, UA: NONE SEEN
Bilirubin Urine: NEGATIVE
Glucose, UA: NEGATIVE mg/dL
Ketones, ur: NEGATIVE mg/dL
Leukocytes, UA: NEGATIVE
Nitrite: NEGATIVE
Protein, ur: 30 mg/dL — AB
Specific Gravity, Urine: 1.021 (ref 1.005–1.030)
pH: 5 (ref 5.0–8.0)

## 2017-01-21 LAB — POC URINE PREG, ED: Preg Test, Ur: NEGATIVE

## 2017-01-21 MED ORDER — SODIUM CHLORIDE 0.9 % IV BOLUS (SEPSIS): 1000.0000 mL | Freq: Once | INTRAVENOUS | Status: DC

## 2017-01-21 NOTE — ED Triage Notes (Signed)
Pt c/o left sided flank pain onset today. Pt reports nausea but denies vomiting.

## 2017-01-21 NOTE — ED Provider Notes (Signed)
[facility]PT Provider Note   CSN: [id number] Arrival date & time: 01/21/17  1244     History   Chief Complaint Chief Complaint  Patient presents with  . Flank Pain    HPI Ashley Harrison is a 46 y.o. female.  HPI Sharp pain started at 12PM. Located left side, went to the bathroom and pain worsened. 8/10 pain lower left back and flank. Took some pain medications and pain worsened.  Pain in side and back.  Felt nauseas. No vomiting.  Feels vaginal pain sharp as well after arriving to ED, however now reports pain has completely resolved.   No dysuria, no hematuria.  No hx kidney stones. No fevers. No falls, trauma, no numbness/weakness of extremities.   Had ablation 4 yrs ago, has irregular menses, none since January.  No vaginal bleeding or discharge.  Not sexually active.  Past Medical History:  Diagnosis Date  . Hypertension   . Iron deficiency anemia, unspecified   . Obesity     Patient Active Problem List   Diagnosis Date Noted  . Anemia 09/21/2011    Past Surgical History:  Procedure Laterality Date  . CESAREAN SECTION  01/2001, 05/2004    x 2  . HYSTEROSCOPY WITH NOVASURE N/A 02/06/2014   Procedure: HYSTEROSCOPY WITH NOVASURE;  Surgeon: Oliver Pila, MD;  Location: [facility];  Service: Gynecology;  Laterality: N/A;  1hr OR time  . TUBAL LIGATION  2005  . WISDOM TOOTH EXTRACTION  1988    OB History    No data available       Home Medications    Prior to Admission medications   Medication Sig Start Date End Date Taking? Authorizing Provider  diphenhydrAMINE (BENADRYL) 25 mg capsule Take 25 mg by mouth every 6 (six) hours as needed for itching.    [provider]  EPINEPHrine 0.3 mg/0.3 mL IJ SOAJ injection Inject 0.3 mLs (0.3 mg total) into the muscle once. 03/01/16   Palumbo, April, MD  famotidine (PEPCID) 20 MG tablet Take 1 tablet (20 mg total) by mouth 2 (two) times daily. 03/01/16   Palumbo, April, MD  lisinopril-hydrochlorothiazide  (PRINZIDE,ZESTORETIC) 20-25 MG per tablet Take 1 tablet by mouth daily.    [provider]  predniSONE (DELTASONE) 20 MG tablet 3 tabs po day one, then 2 po daily x 4 days 03/01/16   Nicanor Alcon, April, MD    Family History Family History  Problem Relation Age of Onset  . Diabetes Mother   . Hyperlipidemia Father   . Hypertension Father     Social History Social History  Substance Use Topics  . Smoking status: Never Smoker  . Smokeless tobacco: Never Used  . Alcohol use No     Allergies   Patient has no known allergies.   Review of Systems Review of Systems  Constitutional: Negative for fever.  HENT: Negative for sore throat.   Eyes: Negative for visual disturbance.  Respiratory: Negative for cough and shortness of breath.   Cardiovascular: Negative for chest pain.  Gastrointestinal: Positive for abdominal pain (pain after BM) and nausea. Negative for constipation, diarrhea and vomiting.  Genitourinary: Positive for flank pain. Negative for difficulty urinating and dysuria.  Musculoskeletal: Negative for back pain and neck pain.  Skin: Negative for rash.  Neurological: Negative for syncope and headaches.     Physical Exam Updated Vital Signs BP (!) 141/85   Pulse 72   Temp 97.7 F (36.5 C)   Resp 18   Ht 5'  3" (1.6 m)   Wt 131.1 kg (289 lb)   LMP 09/23/2016   SpO2 98%   BMI 51.19 kg/m   Physical Exam  Constitutional: She is oriented to person, place, and time. She appears well-developed and well-nourished. No distress.  HENT:  Head: Normocephalic and atraumatic.  Eyes: Conjunctivae and EOM are normal.  Neck: Normal range of motion.  Cardiovascular: Normal rate, regular rhythm, normal heart sounds and intact distal pulses.  Exam reveals no gallop and no friction rub.   No murmur heard. Pulmonary/Chest: Effort normal and breath sounds normal. No respiratory distress. She has no wheezes. She has no rales.  Abdominal: Soft. She exhibits no distension.  There is no tenderness. There is no guarding.  Musculoskeletal: She exhibits no edema or tenderness.  Neurological: She is alert and oriented to person, place, and time.  Skin: Skin is warm and dry. No rash noted. She is not diaphoretic. No erythema.  Nursing note and vitals reviewed.    ED Treatments / Results  Labs (all labs ordered are listed, but only abnormal results are displayed) Labs Reviewed  URINALYSIS, ROUTINE W REFLEX MICROSCOPIC - Abnormal; Notable for the following:       Result Value   APPearance HAZY (*)    Hgb urine dipstick LARGE (*)    Protein, ur 30 (*)    Squamous Epithelial / LPF 0-5 (*)    All other components within normal limits  POC URINE PREG, ED    EKG  EKG Interpretation None       Radiology No results found.  Procedures Procedures (including critical care time)  Medications Ordered in ED Medications - No data to display   Initial Impression / Assessment and Plan / ED Course  I have reviewed the triage vital signs and the nursing notes.  Pertinent labs & imaging results that were available during my care of the patient were reviewed by me and considered in my medical decision making (see chart for details).    46 year old female with a history of hypertension presents with concern for severe left flank pain and nausea. No abdominal pain, doubt ovarian torsion, diverticulitis. Pt denies sexual activity or vaginal discharge and doubt PID/TOA. The flank pain that was present on her arrival to the emergency department eventually radiated towards the groin and vaginally, and now she denies any continuing pain.   Given history of severe left flank pain which is now resolved, suspect likely passed kidney stone. Gave patient options of obtaining further imaging and labs for evaluation--however given she is asymptomatic at this time, feel continue monitoring of symptoms is appropriate and patient agrees. Urine pregnancy test was negative. Urinalysis  shows no sign of infection, however shows hematuria. Feel this fits clinical picture of nephrolithiasis. Recommended continued hydration, primary care physician follow-up for reevaluation of hematuria, and return to the emergency department for any new symptoms.   Final Clinical Impressions(s) / ED Diagnoses   Final diagnoses:  Flank pain, suspect kidney stone by history  Hematuria, unspecified type    New Prescriptions New Prescriptions   No medications on file     Alvira MondaySchlossman, Analiza Cowger, MD 01/21/17 1559

## 2017-02-11 DIAGNOSIS — I10 Essential (primary) hypertension: Secondary | ICD-10-CM | POA: Diagnosis not present

## 2017-02-11 DIAGNOSIS — R300 Dysuria: Secondary | ICD-10-CM | POA: Diagnosis not present

## 2017-02-11 DIAGNOSIS — N3000 Acute cystitis without hematuria: Secondary | ICD-10-CM | POA: Diagnosis not present

## 2017-03-09 DIAGNOSIS — I10 Essential (primary) hypertension: Secondary | ICD-10-CM | POA: Diagnosis not present

## 2017-03-09 DIAGNOSIS — Z0000 Encounter for general adult medical examination without abnormal findings: Secondary | ICD-10-CM | POA: Diagnosis not present

## 2017-03-09 DIAGNOSIS — B351 Tinea unguium: Secondary | ICD-10-CM | POA: Diagnosis not present

## 2017-06-15 DIAGNOSIS — Z01419 Encounter for gynecological examination (general) (routine) without abnormal findings: Secondary | ICD-10-CM | POA: Diagnosis not present

## 2017-06-15 DIAGNOSIS — Z1231 Encounter for screening mammogram for malignant neoplasm of breast: Secondary | ICD-10-CM | POA: Diagnosis not present

## 2017-06-15 DIAGNOSIS — I10 Essential (primary) hypertension: Secondary | ICD-10-CM | POA: Insufficient documentation

## 2017-10-04 DIAGNOSIS — R5383 Other fatigue: Secondary | ICD-10-CM | POA: Diagnosis not present

## 2017-10-04 DIAGNOSIS — E559 Vitamin D deficiency, unspecified: Secondary | ICD-10-CM | POA: Diagnosis not present

## 2017-10-04 DIAGNOSIS — D509 Iron deficiency anemia, unspecified: Secondary | ICD-10-CM | POA: Diagnosis not present

## 2017-10-04 DIAGNOSIS — I10 Essential (primary) hypertension: Secondary | ICD-10-CM | POA: Diagnosis not present

## 2017-10-04 DIAGNOSIS — R7309 Other abnormal glucose: Secondary | ICD-10-CM | POA: Diagnosis not present

## 2017-10-14 DIAGNOSIS — R590 Localized enlarged lymph nodes: Secondary | ICD-10-CM | POA: Diagnosis not present

## 2017-10-14 DIAGNOSIS — I10 Essential (primary) hypertension: Secondary | ICD-10-CM | POA: Diagnosis not present

## 2017-10-14 DIAGNOSIS — H9201 Otalgia, right ear: Secondary | ICD-10-CM | POA: Diagnosis not present

## 2017-10-18 ENCOUNTER — Encounter: Payer: Self-pay | Admitting: Hematology & Oncology

## 2017-10-30 ENCOUNTER — Other Ambulatory Visit: Payer: Self-pay | Admitting: Family

## 2017-10-30 DIAGNOSIS — D649 Anemia, unspecified: Secondary | ICD-10-CM

## 2017-10-31 ENCOUNTER — Inpatient Hospital Stay: Payer: 59

## 2017-10-31 ENCOUNTER — Encounter: Payer: Self-pay | Admitting: Family

## 2017-10-31 ENCOUNTER — Inpatient Hospital Stay: Payer: 59 | Attending: Family | Admitting: Family

## 2017-10-31 ENCOUNTER — Other Ambulatory Visit: Payer: Self-pay

## 2017-10-31 VITALS — BP 149/84 | HR 70 | Resp 20

## 2017-10-31 DIAGNOSIS — D500 Iron deficiency anemia secondary to blood loss (chronic): Secondary | ICD-10-CM

## 2017-10-31 DIAGNOSIS — N921 Excessive and frequent menstruation with irregular cycle: Secondary | ICD-10-CM | POA: Diagnosis not present

## 2017-10-31 DIAGNOSIS — D508 Other iron deficiency anemias: Secondary | ICD-10-CM

## 2017-10-31 DIAGNOSIS — I10 Essential (primary) hypertension: Secondary | ICD-10-CM | POA: Diagnosis not present

## 2017-10-31 DIAGNOSIS — E669 Obesity, unspecified: Secondary | ICD-10-CM | POA: Diagnosis not present

## 2017-10-31 DIAGNOSIS — N920 Excessive and frequent menstruation with regular cycle: Secondary | ICD-10-CM | POA: Insufficient documentation

## 2017-10-31 DIAGNOSIS — Z79899 Other long term (current) drug therapy: Secondary | ICD-10-CM | POA: Diagnosis not present

## 2017-10-31 DIAGNOSIS — D649 Anemia, unspecified: Secondary | ICD-10-CM

## 2017-10-31 DIAGNOSIS — D509 Iron deficiency anemia, unspecified: Secondary | ICD-10-CM | POA: Insufficient documentation

## 2017-10-31 HISTORY — DX: Iron deficiency anemia secondary to blood loss (chronic): D50.0

## 2017-10-31 HISTORY — DX: Excessive and frequent menstruation with irregular cycle: N92.1

## 2017-10-31 LAB — CMP (CANCER CENTER ONLY)
ALK PHOS: 96 U/L (ref 40–150)
ALT: 12 U/L (ref 0–55)
AST: 12 U/L (ref 5–34)
Albumin: 3.1 g/dL — ABNORMAL LOW (ref 3.5–5.0)
Anion gap: 9 (ref 3–11)
BUN: 8 mg/dL (ref 7–26)
CALCIUM: 9.4 mg/dL (ref 8.4–10.4)
CO2: 25 mmol/L (ref 22–29)
CREATININE: 0.71 mg/dL (ref 0.60–1.10)
Chloride: 105 mmol/L (ref 98–109)
Glucose, Bld: 99 mg/dL (ref 70–140)
Potassium: 3.5 mmol/L (ref 3.5–5.1)
SODIUM: 139 mmol/L (ref 136–145)
TOTAL PROTEIN: 7 g/dL (ref 6.4–8.3)
Total Bilirubin: 0.5 mg/dL (ref 0.2–1.2)

## 2017-10-31 LAB — IRON AND TIBC
Iron: 32 ug/dL — ABNORMAL LOW (ref 41–142)
SATURATION RATIOS: 9 % — AB (ref 21–57)
TIBC: 343 ug/dL (ref 236–444)
UIBC: 310 ug/dL

## 2017-10-31 LAB — CBC WITH DIFFERENTIAL (CANCER CENTER ONLY)
BASOS ABS: 0 10*3/uL (ref 0.0–0.1)
BASOS PCT: 0 %
EOS ABS: 0.2 10*3/uL (ref 0.0–0.5)
Eosinophils Relative: 2 %
HEMATOCRIT: 34.2 % — AB (ref 34.8–46.6)
HEMOGLOBIN: 10.9 g/dL — AB (ref 11.6–15.9)
Lymphocytes Relative: 27 %
Lymphs Abs: 2.1 10*3/uL (ref 0.9–3.3)
MCH: 24.1 pg — ABNORMAL LOW (ref 26.0–34.0)
MCHC: 31.9 g/dL — AB (ref 32.0–36.0)
MCV: 75.7 fL — ABNORMAL LOW (ref 81.0–101.0)
MONOS PCT: 6 %
Monocytes Absolute: 0.4 10*3/uL (ref 0.1–0.9)
NEUTROS ABS: 4.8 10*3/uL (ref 1.5–6.5)
NEUTROS PCT: 65 %
Platelet Count: 346 10*3/uL (ref 145–400)
RBC: 4.52 MIL/uL (ref 3.70–5.32)
RDW: 16.6 % — ABNORMAL HIGH (ref 11.1–15.7)
WBC: 7.5 10*3/uL (ref 3.9–10.0)

## 2017-10-31 LAB — RETICULOCYTES
RBC.: 4.57 MIL/uL (ref 3.70–5.45)
RETIC COUNT ABSOLUTE: 77.7 10*3/uL (ref 33.7–90.7)
Retic Ct Pct: 1.7 % (ref 0.7–2.1)

## 2017-10-31 LAB — LACTATE DEHYDROGENASE: LDH: 193 U/L (ref 125–245)

## 2017-10-31 LAB — FERRITIN: Ferritin: 63 ng/mL (ref 9–269)

## 2017-10-31 MED ORDER — SODIUM CHLORIDE 0.9 % IV SOLN
510.0000 mg | Freq: Once | INTRAVENOUS | Status: AC
  Administered 2017-10-31: 510 mg via INTRAVENOUS
  Filled 2017-10-31: qty 17

## 2017-10-31 MED ORDER — SODIUM CHLORIDE 0.9 % IV SOLN
Freq: Once | INTRAVENOUS | Status: AC
  Administered 2017-10-31: 10:00:00 via INTRAVENOUS

## 2017-10-31 NOTE — Progress Notes (Signed)
Hematology/Oncology Consultation   Name: Ashley Harrison      MRN: [id number]    Location: Room/bed info not found  Date: 10/31/2017 Time:8:46 AM   REFERRING PHYSICIAN: [name], MD  REASON FOR CONSULT: Iron deficiency anemia    DIAGNOSIS: Iron deficiency anemia  HISTORY OF PRESENT ILLNESS: Ashley Harrison is a very pleasant 47 yo African American female with long history of iron deficiency due to heavy cycles. She is unable to tolerate oral iron. It causes her severe GI upset. She has had 2 doses of IV iron with the last dose being given over 6 years ago.  Her iron saturation 3 weeks ago was 7%. This has not yet been replaced.  She is symptomatic with fatigued.  She had a uterine ablation in June 2016. This lasted for several years with light to no cycle before she started having a heavy cycles again.  No other bleeding. No bruising or petechiae. No lymphadenopathy found on exam.  No family history of anemia. No sickle cell trait or disease.  No personal or familial cancer history that she knows of.  No fever, chills, n/v, cough, rash, dizziness, SOB, chest pain, palpitations, abdominal pain or changed in bowel or bladder habits.  She has 2 daughters and had 2 c-sections without complication.  No swelling, tenderness, numbness or tingling in her extremities. No c/o pain.  No falls or syncopal episodes.  She has maintained a good appetite and is staying well hydrated. Her weight is stable.  She does not smoke or drink alcohol.  She is not diabetic and has no thyroid problem. She has hypertension and takes her MICARDIS HCT daily as prescribed.   ROS: All other 10 point review of systems is negative.   PAST MEDICAL HISTORY:   Past Medical History:  Diagnosis Date  . Hypertension   . Iron deficiency anemia, unspecified   . Obesity     ALLERGIES: No Known Allergies    MEDICATIONS:  Current Outpatient Medications on File Prior to Visit  Medication Sig Dispense Refill  .  diphenhydrAMINE (BENADRYL) 25 mg capsule Take 25 mg by mouth every 6 (six) hours as needed for itching.    Marland Kitchen EPINEPHrine 0.3 mg/0.3 mL IJ SOAJ injection Inject 0.3 mLs (0.3 mg total) into the muscle once. 1 Device 0  . famotidine (PEPCID) 20 MG tablet Take 1 tablet (20 mg total) by mouth 2 (two) times daily. 30 tablet 0  . lisinopril-hydrochlorothiazide (PRINZIDE,ZESTORETIC) 20-25 MG per tablet Take 1 tablet by mouth daily.    . predniSONE (DELTASONE) 20 MG tablet 3 tabs po day one, then 2 po daily x 4 days 11 tablet 0   No current facility-administered medications on file prior to visit.      PAST SURGICAL HISTORY Past Surgical History:  Procedure Laterality Date  . CESAREAN SECTION  01/2001, 05/2004    x 2  . HYSTEROSCOPY WITH NOVASURE N/A 02/06/2014   Procedure: HYSTEROSCOPY WITH NOVASURE;  Surgeon: Ashley Pila, MD;  Location: [facility];  Service: Gynecology;  Laterality: N/A;  1hr OR time  . TUBAL LIGATION  2005  . WISDOM TOOTH EXTRACTION  1988    FAMILY HISTORY: Family History  Problem Relation Age of Onset  . Diabetes Mother   . Hyperlipidemia Father   . Hypertension Father     SOCIAL HISTORY:  reports that  has never smoked. she has never used smokeless tobacco. She reports that she does not drink alcohol or use drugs.  PERFORMANCE STATUS:  The patient's performance status is 1 - Symptomatic but completely ambulatory  PHYSICAL EXAM: Most Recent Vital Signs: There were no vitals taken for this visit. BP (!) 152/92 (BP Location: Left Arm, Patient Position: Sitting)   Pulse 73   Temp 98.3 F (36.8 C) (Oral)   Resp 20   Wt 293 lb 4 oz (133 kg)   SpO2 99%   BMI 51.95 kg/m   General Appearance:    Alert, cooperative, no distress, appears stated age  Head:    Normocephalic, without obvious abnormality, atraumatic  Eyes:    PERRL, conjunctiva/corneas clear, EOM's intact, fundi    benign, both eyes        Throat:   Lips, mucosa, and tongue normal; teeth and gums  normal  Neck:   Supple, symmetrical, trachea midline, no adenopathy;    thyroid:  no enlargement/tenderness/nodules; no carotid   bruit or JVD  Back:     Symmetric, no curvature, ROM normal, no CVA tenderness  Lungs:     Clear to auscultation bilaterally, respirations unlabored  Chest Wall:    No tenderness or deformity   Heart:    Regular rate and rhythm, S1 and S2 normal, no murmur, rub   or gallop     Abdomen:     Soft, non-tender, bowel sounds active all four quadrants,    no masses, no organomegaly        Extremities:   Extremities normal, atraumatic, no cyanosis or edema  Pulses:   2+ and symmetric all extremities  Skin:   Skin color, texture, turgor normal, no rashes or lesions  Lymph nodes:   Cervical, supraclavicular, and axillary nodes normal  Neurologic:   CNII-XII intact, normal strength, sensation and reflexes    throughout    LABORATORY DATA:  Results for orders placed or performed in visit on 10/31/17 (from the past 48 hour(s))  CBC with Differential (Cancer Center Only)     Status: Abnormal   Collection Time: 10/31/17  8:03 AM  Result Value Ref Range   WBC Count 7.5 3.9 - 10.0 K/uL   RBC 4.52 3.70 - 5.32 MIL/uL   Hemoglobin 10.9 (L) 11.6 - 15.9 g/dL   HCT 16.134.2 (L) 09.634.8 - 04.546.6 %   MCV 75.7 (L) 81.0 - 101.0 fL   MCH 24.1 (L) 26.0 - 34.0 pg   MCHC 31.9 (L) 32.0 - 36.0 g/dL   RDW 40.916.6 (H) 81.111.1 - 91.415.7 %   Platelet Count 346 145 - 400 K/uL   Neutrophils Relative % 65 %   Neutro Abs 4.8 1.5 - 6.5 K/uL   Lymphocytes Relative 27 %   Lymphs Abs 2.1 0.9 - 3.3 K/uL   Monocytes Relative 6 %   Monocytes Absolute 0.4 0.1 - 0.9 K/uL   Eosinophils Relative 2 %   Eosinophils Absolute 0.2 0.0 - 0.5 K/uL   Basophils Relative 0 %   Basophils Absolute 0.0 0.0 - 0.1 K/uL    Comment: Performed at [facility] at [facility] [place], [address], [place], [place] [id number]      RADIOGRAPHY: No results found.     PATHOLOGY: None  ASSESSMENT/PLAN: Ms.  Ashley Harrison is a very pleasant 47 yo [place] female with long history of iron deficiency due to heavy cycles. Oral iron has caused her GI upset. She has done well with IV iron in the past.  Her iron saturation recently was 7%.  We will give her Duncan DullFeraheme today and again next  week.  We will plan to see her back in another 6 weeks for follow-up.   All questions were answered and she is in agreement with the plan. She will contact our office with any  questions or concerns. We can certainly see her sooner if necessary.  She was discussed with and also seen by Dr. Myna Hidalgo and he is in agreement with the aforementioned.   Emeline Gins     Addendum: I saw and examined the patient with Maralyn Sago.  I agree with her recommendations.  I did have a chance to look at her blood smear.  She does have some microcytic red blood cells.  I do not see anything that looked malignant.  She had good maturity of her white blood cells.  Her platelets were slightly increased in number.  It sounds and it looks like this is all from her monthly cycles.  She still has her monthly cycles.  Her iron studies that we got back today showed a ferritin of 63 with iron saturation of only 9%.  The IV iron will definitely help her.  She has 2 teenagers.  I just want her to feel better and have her more active so she can be more functional with her kids.  I would like to see her back in about 6 weeks.  I think 2 doses of IV iron will be necessary for her.  We spent about 40 minutes with her.  Over 50% of the time was spent face-to-face with her.  We reviewed her lab work.  We went over our recommendations.  We answered her questions.  She understands very well.  She thanked [place] for being able to help her out.  Of note, she grew up in the [employer].  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  Christin Bach, MD

## 2017-10-31 NOTE — Patient Instructions (Signed)

## 2017-11-01 LAB — SAVE SMEAR

## 2017-11-01 LAB — ERYTHROPOIETIN: Erythropoietin: 50.7 m[IU]/mL — ABNORMAL HIGH (ref 2.6–18.5)

## 2017-11-07 ENCOUNTER — Inpatient Hospital Stay: Payer: 59

## 2017-11-07 VITALS — BP 126/66 | HR 73 | Temp 98.2°F | Resp 18

## 2017-11-07 DIAGNOSIS — D500 Iron deficiency anemia secondary to blood loss (chronic): Secondary | ICD-10-CM | POA: Diagnosis not present

## 2017-11-07 DIAGNOSIS — N921 Excessive and frequent menstruation with irregular cycle: Secondary | ICD-10-CM

## 2017-11-07 DIAGNOSIS — D508 Other iron deficiency anemias: Secondary | ICD-10-CM

## 2017-11-07 MED ORDER — SODIUM CHLORIDE 0.9 % IV SOLN
Freq: Once | INTRAVENOUS | Status: AC
  Administered 2017-11-07: 09:00:00 via INTRAVENOUS

## 2017-11-07 MED ORDER — FERUMOXYTOL INJECTION 510 MG/17 ML
510.0000 mg | Freq: Once | INTRAVENOUS | Status: AC
  Administered 2017-11-07: 510 mg via INTRAVENOUS
  Filled 2017-11-07: qty 17

## 2017-11-20 DIAGNOSIS — D509 Iron deficiency anemia, unspecified: Secondary | ICD-10-CM | POA: Diagnosis not present

## 2017-11-20 DIAGNOSIS — I10 Essential (primary) hypertension: Secondary | ICD-10-CM | POA: Diagnosis not present

## 2017-11-20 DIAGNOSIS — Z79899 Other long term (current) drug therapy: Secondary | ICD-10-CM | POA: Diagnosis not present

## 2017-12-11 ENCOUNTER — Other Ambulatory Visit: Payer: Self-pay | Admitting: *Deleted

## 2017-12-11 DIAGNOSIS — D500 Iron deficiency anemia secondary to blood loss (chronic): Secondary | ICD-10-CM

## 2017-12-12 ENCOUNTER — Inpatient Hospital Stay: Payer: 59

## 2017-12-12 ENCOUNTER — Inpatient Hospital Stay: Payer: 59 | Attending: Family | Admitting: Family

## 2018-02-02 DIAGNOSIS — M5416 Radiculopathy, lumbar region: Secondary | ICD-10-CM | POA: Diagnosis not present

## 2018-02-07 ENCOUNTER — Other Ambulatory Visit: Payer: Self-pay | Admitting: Physical Medicine and Rehabilitation

## 2018-02-07 DIAGNOSIS — G8929 Other chronic pain: Secondary | ICD-10-CM

## 2018-02-07 DIAGNOSIS — M545 Low back pain: Principal | ICD-10-CM

## 2018-02-11 ENCOUNTER — Ambulatory Visit
Admission: RE | Admit: 2018-02-11 | Discharge: 2018-02-11 | Disposition: A | Discharge status: Home / Self Care | Payer: 59 | Source: Ambulatory Visit | Attending: Physical Medicine and Rehabilitation | Admitting: Physical Medicine and Rehabilitation

## 2018-02-11 DIAGNOSIS — M545 Low back pain: Principal | ICD-10-CM

## 2018-02-11 DIAGNOSIS — G8929 Other chronic pain: Secondary | ICD-10-CM

## 2018-02-12 DIAGNOSIS — M5416 Radiculopathy, lumbar region: Secondary | ICD-10-CM | POA: Diagnosis not present

## 2018-02-14 DIAGNOSIS — D649 Anemia, unspecified: Secondary | ICD-10-CM | POA: Diagnosis not present

## 2018-02-22 ENCOUNTER — Ambulatory Visit (HOSPITAL_COMMUNITY)
Admission: EM | Admit: 2018-02-22 | Discharge: 2018-02-22 | Disposition: A | Discharge status: Home / Self Care | Payer: 59 | Attending: Family Medicine | Admitting: Family Medicine

## 2018-02-22 ENCOUNTER — Encounter (HOSPITAL_COMMUNITY): Payer: Self-pay | Admitting: Emergency Medicine

## 2018-02-22 DIAGNOSIS — Z8249 Family history of ischemic heart disease and other diseases of the circulatory system: Secondary | ICD-10-CM | POA: Insufficient documentation

## 2018-02-22 DIAGNOSIS — Z833 Family history of diabetes mellitus: Secondary | ICD-10-CM | POA: Diagnosis not present

## 2018-02-22 DIAGNOSIS — Z91013 Allergy to seafood: Secondary | ICD-10-CM | POA: Diagnosis not present

## 2018-02-22 DIAGNOSIS — Z9851 Tubal ligation status: Secondary | ICD-10-CM | POA: Insufficient documentation

## 2018-02-22 DIAGNOSIS — E669 Obesity, unspecified: Secondary | ICD-10-CM | POA: Insufficient documentation

## 2018-02-22 DIAGNOSIS — Z79899 Other long term (current) drug therapy: Secondary | ICD-10-CM | POA: Diagnosis not present

## 2018-02-22 DIAGNOSIS — Z9889 Other specified postprocedural states: Secondary | ICD-10-CM | POA: Diagnosis not present

## 2018-02-22 DIAGNOSIS — N390 Urinary tract infection, site not specified: Secondary | ICD-10-CM | POA: Insufficient documentation

## 2018-02-22 DIAGNOSIS — I10 Essential (primary) hypertension: Secondary | ICD-10-CM | POA: Diagnosis not present

## 2018-02-22 DIAGNOSIS — N921 Excessive and frequent menstruation with irregular cycle: Secondary | ICD-10-CM | POA: Diagnosis not present

## 2018-02-22 DIAGNOSIS — D500 Iron deficiency anemia secondary to blood loss (chronic): Secondary | ICD-10-CM | POA: Diagnosis not present

## 2018-02-22 LAB — POCT URINALYSIS DIP (DEVICE)
BILIRUBIN URINE: NEGATIVE
Glucose, UA: NEGATIVE mg/dL
Ketones, ur: NEGATIVE mg/dL
NITRITE: POSITIVE — AB
Protein, ur: 30 mg/dL — AB
Specific Gravity, Urine: >=1.03 (ref 1.005–1.030)
Urobilinogen, UA: 1 mg/dL (ref 0.0–1.0)
pH: 5.5 (ref 5.0–8.0)

## 2018-02-22 MED ORDER — NITROFURANTOIN MONOHYD MACRO 100 MG PO CAPS: 100.0000 mg | ORAL_CAPSULE | Freq: Two times a day (BID) | ORAL | 0 refills | Status: AC

## 2018-02-22 NOTE — ED Provider Notes (Signed)
[facility]    CSN: [id number] Arrival date & time: 02/22/18  1945     History   Chief Complaint Chief Complaint  Patient presents with  . Urinary Frequency    HPI Ashley Harrison is a 47 y.o. female.   Ashley Harrison presents with complaints of urinary urgency and frequency which just started today. States has had similar in the past. Tends to occur after being on her period. Period just finished. Uncomfortable with urination but no specific burning or pain. No abdominal or back pain. No fevers. No blood in urine. Denies vaginal discharge itching odor.  No gi complaints. Hx htn.     ROS per HPI.      Past Medical History:  Diagnosis Date  . Hypertension   . Iron deficiency anemia due to chronic blood loss 10/31/2017  . Iron deficiency anemia, unspecified   . Menometrorrhagia 10/31/2017  . Obesity     Patient Active Problem List   Diagnosis Date Noted  . Iron deficiency anemia due to chronic blood loss 10/31/2017  . Menometrorrhagia 10/31/2017  . Anemia 09/21/2011    Past Surgical History:  Procedure Laterality Date  . CESAREAN SECTION  01/2001, 05/2004    x 2  . HYSTEROSCOPY WITH NOVASURE N/A 02/06/2014   Procedure: HYSTEROSCOPY WITH NOVASURE;  Surgeon: Oliver Pila, MD;  Location: [facility];  Service: Gynecology;  Laterality: N/A;  1hr OR time  . TUBAL LIGATION  2005  . WISDOM TOOTH EXTRACTION  1988    OB History   None      Home Medications    Prior to Admission medications   Medication Sig Start Date End Date Taking? Authorizing Provider  diphenhydrAMINE (BENADRYL) 25 mg capsule Take 25 mg by mouth every 6 (six) hours as needed for itching.    [provider]  EPINEPHrine 0.3 mg/0.3 mL IJ SOAJ injection Inject 0.3 mLs (0.3 mg total) into the muscle once. Patient not taking: Reported on 10/31/2017 03/01/16   Palumbo, April, MD  nitrofurantoin, macrocrystal-monohydrate, (MACROBID) 100 MG capsule Take 1 capsule (100 mg total) by mouth 2 (two)  times daily for 5 days. 02/22/18 02/27/18  Georgetta Haber, NP  telmisartan-hydrochlorothiazide (MICARDIS HCT) 40-12.5 MG tablet Take 1 tablet by mouth daily.    [provider]    Family History Family History  Problem Relation Age of Onset  . Diabetes Mother   . Hyperlipidemia Father   . Hypertension Father     Social History Social History   Tobacco Use  . Smoking status: Never Smoker  . Smokeless tobacco: Never Used  Substance Use Topics  . Alcohol use: No  . Drug use: No     Allergies   Shellfish allergy   Review of Systems Review of Systems   Physical Exam Triage Vital Signs ED Triage Vitals [02/22/18 1959]  Enc Vitals Group     BP (!) 168/80     Pulse Rate 89     Resp 18     Temp 97.6 F (36.4 C)     Temp src      SpO2 100 %     Weight      Height      Head Circumference      Peak Flow      Pain Score      Pain Loc      Pain Edu?      Excl. in GC?    No data found.  Updated Vital Signs BP Marland Kitchen)  168/80   Pulse 89   Temp 97.6 F (36.4 C)   Resp 18   SpO2 100%    Physical Exam  Constitutional: She is oriented to person, place, and time. She appears well-developed and well-nourished. No distress.  Cardiovascular: Normal rate, regular rhythm and normal heart sounds.  Pulmonary/Chest: Effort normal and breath sounds normal.  Abdominal: Soft. There is no tenderness. There is no rigidity, no rebound, no guarding, no CVA tenderness, no tenderness at McBurney's point and negative Murphy's sign.  Neurological: She is alert and oriented to person, place, and time.  Skin: Skin is warm and dry.     UC Treatments / Results  Labs (all labs ordered are listed, but only abnormal results are displayed) Labs Reviewed  POCT URINALYSIS DIP (DEVICE) - Abnormal; Notable for the following components:      Result Value   Hgb urine dipstick MODERATE (*)    Protein, ur 30 (*)    Nitrite POSITIVE (*)    Leukocytes, UA TRACE (*)    All other components  within normal limits  URINE CULTURE    EKG None  Radiology No results found.  Procedures Procedures (including critical care time)  Medications Ordered in UC Medications - No data to display  Initial Impression / Assessment and Plan / UC Course  I have reviewed the triage vital signs and the nursing notes.  Pertinent labs & imaging results that were available during my care of the patient were reviewed by me and considered in my medical decision making (see chart for details).     Urine results and history consistent with uti. Course of macrobid initiated. Culture pending. Return precautions provided. Patient verbalized understanding and agreeable to plan.    Final Clinical Impressions(s) / UC Diagnoses   Final diagnoses:  Lower urinary tract infectious disease     Discharge Instructions     Drink plenty of water to empty bladder regularly.  Alcohol and caffeine can be irritating to the bladder.  Complete course of antibiotics.   I have sent your urine to be cultured as well and would notify you if not sensitive to antibiotics prescribed. If symptoms worsen or do not improve in the next week to return to be seen or to follow up with your PCP.      ED Prescriptions    Medication Sig Dispense Auth. Provider   nitrofurantoin, macrocrystal-monohydrate, (MACROBID) 100 MG capsule Take 1 capsule (100 mg total) by mouth 2 (two) times daily for 5 days. 10 capsule Georgetta HaberBurky, Natalie B, NP     Controlled Substance Prescriptions [place] Controlled Substance Registry consulted? Not Applicable   Georgetta HaberBurky, Natalie B, NP 02/22/18 2017

## 2018-02-22 NOTE — ED Triage Notes (Signed)
Pt c/o urinary frequency. 

## 2018-02-22 NOTE — Discharge Instructions (Signed)
Drink plenty of water to empty bladder regularly.  Alcohol and caffeine can be irritating to the bladder.  Complete course of antibiotics.   I have sent your urine to be cultured as well and would notify you if not sensitive to antibiotics prescribed. If symptoms worsen or do not improve in the next week to return to be seen or to follow up with your PCP.

## 2018-02-25 LAB — URINE CULTURE: Culture: >=100000 — AB

## 2018-02-26 ENCOUNTER — Inpatient Hospital Stay (HOSPITAL_BASED_OUTPATIENT_CLINIC_OR_DEPARTMENT_OTHER): Payer: 59 | Admitting: Family

## 2018-02-26 ENCOUNTER — Encounter: Payer: Self-pay | Admitting: Family

## 2018-02-26 ENCOUNTER — Telehealth (HOSPITAL_COMMUNITY): Payer: Self-pay

## 2018-02-26 ENCOUNTER — Inpatient Hospital Stay: Payer: 59 | Attending: Family

## 2018-02-26 ENCOUNTER — Other Ambulatory Visit: Payer: Self-pay

## 2018-02-26 VITALS — BP 145/77 | HR 83 | Temp 98.3°F | Resp 18 | Wt 297.8 lb

## 2018-02-26 DIAGNOSIS — D509 Iron deficiency anemia, unspecified: Secondary | ICD-10-CM | POA: Diagnosis present

## 2018-02-26 DIAGNOSIS — D500 Iron deficiency anemia secondary to blood loss (chronic): Secondary | ICD-10-CM | POA: Diagnosis not present

## 2018-02-26 DIAGNOSIS — Z79899 Other long term (current) drug therapy: Secondary | ICD-10-CM | POA: Diagnosis not present

## 2018-02-26 LAB — SAVE SMEAR

## 2018-02-26 LAB — CMP (CANCER CENTER ONLY)
ALT: 23 U/L (ref 10–47)
ANION GAP: 11 (ref 5–15)
AST: 17 U/L (ref 11–38)
Albumin: 3.5 g/dL (ref 3.5–5.0)
Alkaline Phosphatase: 89 U/L — ABNORMAL HIGH (ref 26–84)
BUN: 8 mg/dL (ref 7–22)
CHLORIDE: 96 mmol/L — AB (ref 98–108)
CO2: 30 mmol/L (ref 18–33)
Calcium: 9.3 mg/dL (ref 8.0–10.3)
Creatinine: 0.6 mg/dL (ref 0.60–1.20)
Glucose, Bld: 106 mg/dL (ref 73–118)
POTASSIUM: 3.3 mmol/L (ref 3.3–4.7)
Sodium: 137 mmol/L (ref 128–145)
Total Bilirubin: 0.8 mg/dL (ref 0.2–1.6)
Total Protein: 7.4 g/dL (ref 6.4–8.1)

## 2018-02-26 LAB — CBC WITH DIFFERENTIAL (CANCER CENTER ONLY)
BASOS ABS: 0 10*3/uL (ref 0.0–0.1)
BASOS PCT: 0 %
EOS ABS: 0.2 10*3/uL (ref 0.0–0.5)
Eosinophils Relative: 2 %
HCT: 37.7 % (ref 34.8–46.6)
Hemoglobin: 12.4 g/dL (ref 11.6–15.9)
Lymphocytes Relative: 29 %
Lymphs Abs: 2.6 10*3/uL (ref 0.9–3.3)
MCH: 27.3 pg (ref 26.0–34.0)
MCHC: 32.9 g/dL (ref 32.0–36.0)
MCV: 82.9 fL (ref 81.0–101.0)
MONO ABS: 0.6 10*3/uL (ref 0.1–0.9)
MONOS PCT: 7 %
NEUTROS ABS: 5.6 10*3/uL (ref 1.5–6.5)
NEUTROS PCT: 62 %
Platelet Count: 326 10*3/uL (ref 145–400)
RBC: 4.55 MIL/uL (ref 3.70–5.32)
RDW: 15 % (ref 11.1–15.7)
WBC Count: 9 10*3/uL (ref 3.9–10.0)

## 2018-02-26 NOTE — Progress Notes (Signed)
Hematology and Oncology Follow Up Visit  Ashley Harrison [id number] [birth year] 47 y.o. 02/26/2018   Principle Diagnosis:  Iron deficiency anemia   Current Therapy:   IV iron as indicated - last received March 2019 x 2   Interim History:  Ashley Harrison is here today for follow-up. She is still feeling fatigued.  Her Hgb has improved at 12.4 and MCV of 82.  She has had no episodes of bleeding, no bruising or petechiae.  No fever, chills, n/v, cough, rash, dizziness, SOB, chest pain, palpitations, abdominal pain or changes in bowel or bladder habits.  She has history of HTN and swelling in her lower extremities and started taking Micardis Hct in February. She feels that the swelling may be a bit worse. She will follow-up with her PCP for further evaluation.  No numbness or tingling in her extremities at this time.  She has a good appetite and is staying well hydrated. Her weight is stable.   ECOG Performance Status: 1 - Symptomatic but completely ambulatory  Medications:  Allergies as of 02/26/2018      Reactions   Shellfish Allergy Hives      Medication List        Accurate as of 02/26/18  3:38 PM. Always use your most recent med list.          diphenhydrAMINE 25 mg capsule Commonly known as:  BENADRYL Take 25 mg by mouth every 6 (six) hours as needed for itching.   EPINEPHrine 0.3 mg/0.3 mL Soaj injection Commonly known as:  EPI-PEN Inject 0.3 mLs (0.3 mg total) into the muscle once.   nitrofurantoin (macrocrystal-monohydrate) 100 MG capsule Commonly known as:  MACROBID Take 1 capsule (100 mg total) by mouth 2 (two) times daily for 5 days.   telmisartan-hydrochlorothiazide 40-12.5 MG tablet Commonly known as:  MICARDIS HCT Take 1 tablet by mouth daily.       Allergies:  Allergies  Allergen Reactions  . Shellfish Allergy Hives    Past Medical History, Surgical history, Social history, and Family History were reviewed and updated.  Review of Systems: All other  10 point review of systems is negative.   Physical Exam:  weight is 297 lb 12.8 oz (135.1 kg). Her oral temperature is 98.3 F (36.8 C). Her blood pressure is 145/77 (abnormal) and her pulse is 83. Her respiration is 18 and oxygen saturation is 99%.   Wt Readings from Last 3 Encounters:  02/26/18 297 lb 12.8 oz (135.1 kg)  10/31/17 293 lb 4 oz (133 kg)  01/21/17 289 lb (131.1 kg)    Ocular: Sclerae unicteric, pupils equal, round and reactive to light Ear-nose-throat: Oropharynx clear, dentition fair Lymphatic: No cervical, supraclavicular or axillary adenopathy Lungs no rales or rhonchi, good excursion bilaterally Heart regular rate and rhythm, no murmur appreciated Abd soft, nontender, positive bowel sounds, no liver or spleen tip palpated on exam, no fluid wave  MSK no focal spinal tenderness, no joint edema Neuro: non-focal, well-oriented, appropriate affect Breasts: Deferred   Lab Results  Component Value Date   WBC 9.0 02/26/2018   HGB 12.4 02/26/2018   HCT 37.7 02/26/2018   MCV 82.9 02/26/2018   PLT 326 02/26/2018   Lab Results  Component Value Date   FERRITIN 63 10/31/2017   IRON 32 (L) 10/31/2017   TIBC 343 10/31/2017   UIBC 310 10/31/2017   IRONPCTSAT 9 (L) 10/31/2017   Lab Results  Component Value Date   RETICCTPCT 1.7 10/31/2017   RBC 4.55 02/26/2018  No results found for: KPAFRELGTCHN, LAMBDASER, KAPLAMBRATIO No results found for: IGGSERUM, IGA, IGMSERUM No results found for: Dorene ArOTALPROTELP, ALBUMINELP, A1GS, A2GS, Karn PicklerBETS, BETA2SER, GAMS, MSPIKE, SPEI   Chemistry      Component Value Date/Time   NA 137 02/26/2018 1505   K 3.3 02/26/2018 1505   CL 96 (L) 02/26/2018 1505   CO2 30 02/26/2018 1505   BUN 8 02/26/2018 1505   CREATININE 0.60 02/26/2018 1505      Component Value Date/Time   CALCIUM 9.3 02/26/2018 1505   ALKPHOS 89 (H) 02/26/2018 1505   AST 17 02/26/2018 1505   ALT 23 02/26/2018 1505   BILITOT 0.8 02/26/2018 1505      Impression and  Plan: Ashley Harrison is a very pleasant 47 yo [place] female with long history of iron deficiency. She is still symptomatic with fatigue.  We will see what her iron studies show and bring her back in for infusion if needed.  We will go ahead and plan to see her back in another 3 months for follow-up.  She will contact our office with any questions or concerns. We can certainly see her sooner if need be.   Emeline GinsSarah Astella Desir, NP 7/1/20193:38 PM

## 2018-02-26 NOTE — Telephone Encounter (Signed)
Urine culture was positive for E.coli and was given Macrobid at urgent care visit.Attempted to reach patient. No answer.

## 2018-02-27 LAB — FERRITIN: Ferritin: 230 ng/mL (ref 11–307)

## 2018-02-27 LAB — IRON AND TIBC
IRON: 43 ug/dL (ref 41–142)
Saturation Ratios: 13 % — ABNORMAL LOW (ref 21–57)
TIBC: 323 ug/dL (ref 236–444)
UIBC: 280 ug/dL

## 2018-03-12 ENCOUNTER — Inpatient Hospital Stay: Payer: 59

## 2018-03-12 VITALS — BP 115/63 | HR 72 | Temp 98.1°F | Resp 20

## 2018-03-12 DIAGNOSIS — D509 Iron deficiency anemia, unspecified: Secondary | ICD-10-CM | POA: Diagnosis not present

## 2018-03-12 DIAGNOSIS — N921 Excessive and frequent menstruation with irregular cycle: Secondary | ICD-10-CM

## 2018-03-12 DIAGNOSIS — D500 Iron deficiency anemia secondary to blood loss (chronic): Secondary | ICD-10-CM

## 2018-03-12 DIAGNOSIS — D508 Other iron deficiency anemias: Secondary | ICD-10-CM

## 2018-03-12 MED ORDER — SODIUM CHLORIDE 0.9 % IV SOLN
INTRAVENOUS | Status: DC
Start: 2018-03-12 — End: 2018-03-12
  Administered 2018-03-12: 15:00:00 via INTRAVENOUS

## 2018-03-12 MED ORDER — SODIUM CHLORIDE 0.9 % IV SOLN
510.0000 mg | Freq: Once | INTRAVENOUS | Status: AC
  Administered 2018-03-12: 510 mg via INTRAVENOUS
  Filled 2018-03-12: qty 17

## 2018-03-12 NOTE — Patient Instructions (Signed)

## 2018-04-26 DIAGNOSIS — Z0001 Encounter for general adult medical examination with abnormal findings: Secondary | ICD-10-CM | POA: Diagnosis not present

## 2018-04-26 DIAGNOSIS — Z0000 Encounter for general adult medical examination without abnormal findings: Secondary | ICD-10-CM | POA: Diagnosis not present

## 2018-04-26 DIAGNOSIS — I10 Essential (primary) hypertension: Secondary | ICD-10-CM | POA: Diagnosis not present

## 2018-05-29 ENCOUNTER — Other Ambulatory Visit: Payer: Self-pay

## 2018-05-29 ENCOUNTER — Inpatient Hospital Stay: Payer: 59

## 2018-05-29 ENCOUNTER — Encounter: Payer: Self-pay | Admitting: Family

## 2018-05-29 ENCOUNTER — Inpatient Hospital Stay: Payer: 59 | Attending: Family | Admitting: Family

## 2018-05-29 VITALS — BP 107/76 | HR 68 | Temp 97.6°F | Resp 18 | Wt 296.4 lb

## 2018-05-29 DIAGNOSIS — D500 Iron deficiency anemia secondary to blood loss (chronic): Secondary | ICD-10-CM

## 2018-05-29 DIAGNOSIS — D509 Iron deficiency anemia, unspecified: Secondary | ICD-10-CM | POA: Diagnosis not present

## 2018-05-29 DIAGNOSIS — Z79899 Other long term (current) drug therapy: Secondary | ICD-10-CM | POA: Diagnosis not present

## 2018-05-29 DIAGNOSIS — N921 Excessive and frequent menstruation with irregular cycle: Secondary | ICD-10-CM

## 2018-05-29 LAB — CBC WITH DIFFERENTIAL (CANCER CENTER ONLY)
BASOS PCT: 0 %
Basophils Absolute: 0 10*3/uL (ref 0.0–0.1)
EOS ABS: 0.1 10*3/uL (ref 0.0–0.5)
EOS PCT: 2 %
HCT: 38.3 % (ref 34.8–46.6)
Hemoglobin: 12.1 g/dL (ref 11.6–15.9)
LYMPHS ABS: 1.7 10*3/uL (ref 0.9–3.3)
Lymphocytes Relative: 26 %
MCH: 27.3 pg (ref 26.0–34.0)
MCHC: 31.6 g/dL — ABNORMAL LOW (ref 32.0–36.0)
MCV: 86.5 fL (ref 81.0–101.0)
MONO ABS: 0.4 10*3/uL (ref 0.1–0.9)
MONOS PCT: 6 %
Neutro Abs: 4.3 10*3/uL (ref 1.5–6.5)
Neutrophils Relative %: 66 %
PLATELETS: 274 10*3/uL (ref 145–400)
RBC: 4.43 MIL/uL (ref 3.70–5.32)
RDW: 14 % (ref 11.1–15.7)
WBC Count: 6.5 10*3/uL (ref 3.9–10.0)

## 2018-05-29 LAB — IRON AND TIBC
IRON: 80 ug/dL (ref 41–142)
Saturation Ratios: 27 % (ref 21–57)
TIBC: 299 ug/dL (ref 236–444)
UIBC: 219 ug/dL

## 2018-05-29 LAB — FERRITIN: FERRITIN: 413 ng/mL — AB (ref 11–307)

## 2018-05-29 NOTE — Progress Notes (Signed)
Hematology and Oncology Follow Up Visit  LYLIAN SANAGUSTIN [id number] 02-20-1971 47 y.o. 05/29/2018   Principle Diagnosis:  Iron deficiency anemia  Current Therapy:   IV iron as indicated - last received July 2019   Interim History: Ms. Brandy is here today for follow-up. She is doing well and has no complaint at this time.  Since her ablation several years ago, she only has a cycle once every few months.  No other bleeding, no bruising or petechiae.  No fever, chills, n/v, cough, rash, dizziness, SOB, chest pain, palpitations, abdominal pain or changes in bowel or bladder habits.  No tenderness, numbness or tingling in her extremities.  She feels that her fluid retention is controlled nicely at this time.  No lymphadenopathy noted on exam.  She has maintained a good appetite and is staying well hydrated. Her weight is stable.   ECOG Performance Status: 0 - Asymptomatic  Medications:  Allergies as of 05/29/2018      Reactions   Shellfish Allergy Hives      Medication List        Accurate as of 05/29/18  8:57 AM. Always use your most recent med list.          diphenhydrAMINE 25 mg capsule Commonly known as:  BENADRYL Take 25 mg by mouth every 6 (six) hours as needed for itching.   EPINEPHrine 0.3 mg/0.3 mL Soaj injection Commonly known as:  EPI-PEN Inject 0.3 mLs (0.3 mg total) into the muscle once.   hydrochlorothiazide 12.5 MG tablet Commonly known as:  HYDRODIURIL Take 12.5 mg by mouth daily.   telmisartan-hydrochlorothiazide 40-12.5 MG tablet Commonly known as:  MICARDIS HCT Take 1 tablet by mouth daily.       Allergies:  Allergies  Allergen Reactions  . Shellfish Allergy Hives    Past Medical History, Surgical history, Social history, and Family History were reviewed and updated.  Review of Systems: All other 10 point review of systems is negative.   Physical Exam:  weight is 296 lb 6.4 oz (134.4 kg). Her oral temperature is 97.6 F (36.4 C).  Her blood pressure is 107/76 and her pulse is 68. Her respiration is 18 and oxygen saturation is 100%.   Wt Readings from Last 3 Encounters:  05/29/18 296 lb 6.4 oz (134.4 kg)  02/26/18 297 lb 12.8 oz (135.1 kg)  10/31/17 293 lb 4 oz (133 kg)    Ocular: Sclerae unicteric, pupils equal, round and reactive to light Ear-nose-throat: Oropharynx clear, dentition fair Lymphatic: No cervical, supraclavicular or axillary adenopathy Lungs no rales or rhonchi, good excursion bilaterally Heart regular rate and rhythm, no murmur appreciated Abd soft, nontender, positive bowel sounds, no liver or spleen tip palpated on exam, no fluid wave  MSK no focal spinal tenderness, no joint edema Neuro: non-focal, well-oriented, appropriate affect Breasts: Deferred   Lab Results  Component Value Date   WBC 6.5 05/29/2018   HGB 12.1 05/29/2018   HCT 38.3 05/29/2018   MCV 86.5 05/29/2018   PLT 274 05/29/2018   Lab Results  Component Value Date   FERRITIN 230 02/26/2018   IRON 43 02/26/2018   TIBC 323 02/26/2018   UIBC 280 02/26/2018   IRONPCTSAT 13 (L) 02/26/2018   Lab Results  Component Value Date   RETICCTPCT 1.7 10/31/2017   RBC 4.43 05/29/2018   No results found for: KPAFRELGTCHN, LAMBDASER, KAPLAMBRATIO No results found for: IGGSERUM, IGA, IGMSERUM No results found for: TOTALPROTELP, ALBUMINELP, A1GS, A2GS, BETS, BETA2SER, GAMS, MSPIKE, SPEI  Chemistry      Component Value Date/Time   NA 137 02/26/2018 1505   K 3.3 02/26/2018 1505   CL 96 (L) 02/26/2018 1505   CO2 30 02/26/2018 1505   BUN 8 02/26/2018 1505   CREATININE 0.60 02/26/2018 1505      Component Value Date/Time   CALCIUM 9.3 02/26/2018 1505   ALKPHOS 89 (H) 02/26/2018 1505   AST 17 02/26/2018 1505   ALT 23 02/26/2018 1505   BILITOT 0.8 02/26/2018 1505      Impression and Plan: Ms. Smay is a very pleasant 47 yo [place] female with long history of iron deficiency. She is doing well and has no complaints at  this time.  We will see what her iron studies show and bring her back in for infusion if needed.  We will plan to see her back in another 3 months for follow-up.  She will contact our office with any questions or concerns. We can certainly see her sooner if need be.   Emeline Gins, NP 10/1/20198:57 AM

## 2018-06-04 ENCOUNTER — Ambulatory Visit: Payer: 59 | Admitting: Internal Medicine

## 2018-06-05 ENCOUNTER — Encounter: Payer: Self-pay | Admitting: Internal Medicine

## 2018-06-05 ENCOUNTER — Ambulatory Visit: Payer: 59 | Admitting: Internal Medicine

## 2018-06-05 VITALS — BP 124/86 | HR 83 | Temp 98.2°F | Ht 63.0 in | Wt 301.2 lb

## 2018-06-05 DIAGNOSIS — I10 Essential (primary) hypertension: Secondary | ICD-10-CM | POA: Diagnosis not present

## 2018-06-05 DIAGNOSIS — R600 Localized edema: Secondary | ICD-10-CM

## 2018-06-05 DIAGNOSIS — R12 Heartburn: Secondary | ICD-10-CM | POA: Diagnosis not present

## 2018-06-05 DIAGNOSIS — Z6841 Body Mass Index (BMI) 40.0 and over, adult: Secondary | ICD-10-CM

## 2018-06-05 DIAGNOSIS — D509 Iron deficiency anemia, unspecified: Secondary | ICD-10-CM

## 2018-06-05 NOTE — Progress Notes (Signed)
  Subjective:     Patient ID: Ashley Harrison , female    DOB: Jan 09, 1971 , 47 y.o.   MRN: [id number]   Hypertension  This is a chronic problem. The current episode started more than 1 year ago. The problem has been gradually improving since onset. The problem is controlled. Pertinent negatives include no chest pain or headaches. Risk factors for coronary artery disease include sedentary lifestyle and obesity. The current treatment provides moderate improvement.   HCTZ was added to her regimen at her last visit. She has tolerated medication w/o side effects.   Past Medical History:  Diagnosis Date  . Hypertension   . Iron deficiency anemia due to chronic blood loss 10/31/2017  . Iron deficiency anemia, unspecified   . Menometrorrhagia 10/31/2017  . Obesity       Current Outpatient Medications:  .  hydrochlorothiazide (HYDRODIURIL) 12.5 MG tablet, Take 12.5 mg by mouth daily., Disp: , Rfl:  .  telmisartan-hydrochlorothiazide (MICARDIS HCT) 40-12.5 MG tablet, Take 1 tablet by mouth daily., Disp: , Rfl:    Review of Systems  Constitutional: Negative.   HENT: Negative.   Eyes: Negative.   Respiratory: Negative.   Cardiovascular: Negative.  Negative for chest pain.  Gastrointestinal:       She c/o heartburn today.   Neurological: Negative for headaches.  Psychiatric/Behavioral: Negative.      Today's Vitals   06/05/18 1414  BP: 124/86  Pulse: 83  Temp: 98.2 F (36.8 C)  TempSrc: Oral  Weight: (!) 301 lb 3.2 oz (136.6 kg)  Height: 5' 3" (1.6 m)   Body mass index is 53.36 kg/m.   Objective:  Physical Exam  Constitutional: She appears well-developed and well-nourished.  HENT:  Head: Normocephalic and atraumatic.  Eyes: EOM are normal.  Neck: Normal range of motion. Neck supple.  Cardiovascular: Normal rate and regular rhythm.  There is LE edema  Pulmonary/Chest: Effort normal and breath sounds normal.  Skin: Skin is warm and dry.  Nursing note and vitals reviewed.       Assessment And Plan:     Essential hypertension, benign - Controlled. She will continue with current meds - both Telmisartan/hct and hct. She is encouraged to increase her intake of potassium rich foods.  - Plan: BMP8+EGFR  Iron deficiency anemia, unspecified iron deficiency anemia type - She is encouraged to continue with iron supplementation. I wil check her levels at her next visit.   Heartburn - She is encouraged to avoid acidic foods including citrus juices, tomatoes, etc. She will let me know if her sx persist.   Morbid obesity due to excess calories ([place]), Chronic - She is encouraged to strive to lose 15-20 pounds to decrease cardiac risk. She is encouraged to incorporate more exercise into her daily routine.  Localized edema - She is encouraged to elevate her legs while seated and to avoid adding salt to her foods. I have ordered Sleep study in the past; however, this was not done.  I will contact Dr. Brett Fairy to see if this was due to an insurance issue. Patient is in agreement with her treatment plan.          [name], MD

## 2018-06-06 LAB — BMP8+EGFR
BUN / CREAT RATIO: 11 (ref 9–23)
BUN: 9 mg/dL (ref 6–24)
CO2: 23 mmol/L (ref 20–29)
Calcium: 9.7 mg/dL (ref 8.7–10.2)
Chloride: 99 mmol/L (ref 96–106)
Creatinine, Ser: 0.79 mg/dL (ref 0.57–1.00)
GFR calc Af Amer: 102 mL/min/{1.73_m2} (ref >59)
GFR, EST NON AFRICAN AMERICAN: 89 mL/min/{1.73_m2} (ref >59)
Glucose: 102 mg/dL — ABNORMAL HIGH (ref 65–99)
POTASSIUM: 4.4 mmol/L (ref 3.5–5.2)
SODIUM: 143 mmol/L (ref 134–144)

## 2018-06-06 NOTE — Progress Notes (Signed)
Your kidney function is normal.

## 2018-06-19 DIAGNOSIS — R600 Localized edema: Secondary | ICD-10-CM | POA: Diagnosis not present

## 2018-06-19 DIAGNOSIS — I10 Essential (primary) hypertension: Secondary | ICD-10-CM | POA: Diagnosis not present

## 2018-07-30 ENCOUNTER — Other Ambulatory Visit: Payer: Self-pay | Admitting: Internal Medicine

## 2018-07-31 ENCOUNTER — Encounter: Payer: Self-pay | Admitting: Internal Medicine

## 2018-07-31 ENCOUNTER — Ambulatory Visit: Payer: 59 | Admitting: Internal Medicine

## 2018-07-31 VITALS — BP 130/90 | HR 84 | Temp 97.5°F | Ht 62.0 in | Wt 302.2 lb

## 2018-07-31 DIAGNOSIS — H6983 Other specified disorders of Eustachian tube, bilateral: Secondary | ICD-10-CM | POA: Diagnosis not present

## 2018-07-31 NOTE — Patient Instructions (Addendum)
Use Flonase 2 sprays each nostril ones a day for 7 days.     Eustachian Tube Dysfunction The eustachian tube connects the middle ear to the back of the nose. It regulates air pressure in the middle ear by allowing air to move between the ear and nose. It also helps to drain fluid from the middle ear space. When the eustachian tube does not function properly, air pressure, fluid, or both can build up in the middle ear. Eustachian tube dysfunction can affect one or both ears. What are the causes? This condition happens when the eustachian tube becomes blocked or cannot open normally. This may result from:  Ear infections.  Colds and other upper respiratory infections.  Allergies.  Irritation, such as from cigarette smoke or acid from the stomach coming up into the esophagus (gastroesophageal reflux).  Sudden changes in air pressure, such as from descending in an airplane.  Abnormal growths in the nose or throat, such as nasal polyps, tumors, or enlarged tissue at the back of the throat (adenoids).  What increases the risk? This condition may be more likely to develop in people who smoke and people who are overweight. Eustachian tube dysfunction may also be more likely to develop in children, especially children who have:  Certain birth defects of the mouth, such as cleft palate.  Large tonsils and adenoids.  What are the signs or symptoms? Symptoms of this condition may include:  A feeling of fullness in the ear.  Ear pain.  Clicking or popping noises in the ear.  Ringing in the ear.  Hearing loss.  Loss of balance.  Symptoms may get worse when the air pressure around you changes, such as when you travel to an area of high elevation or fly on an airplane. How is this diagnosed? This condition may be diagnosed based on:  Your symptoms.  A physical exam of your ear, nose, and throat.  Tests, such as those that measure: ? The movement of your eardrum  (tympanogram). ? Your hearing (audiometry).  How is this treated? Treatment depends on the cause and severity of your condition. If your symptoms are mild, you may be able to relieve your symptoms by moving air into ("popping") your ears. If you have symptoms of fluid in your ears, treatment may include:  Decongestants.  Antihistamines.  Nasal sprays or ear drops that contain medicines that reduce swelling (steroids).  In some cases, you may need to have a procedure to drain the fluid in your eardrum (myringotomy). In this procedure, a small tube is placed in the eardrum to:  Drain the fluid.  Restore the air in the middle ear space.  Follow these instructions at home:  Take over-the-counter and prescription medicines only as told by your health care provider.  Use techniques to help pop your ears as recommended by your health care provider. These may include: ? Chewing gum. ? Yawning. ? Frequent, forceful swallowing. ? Closing your mouth, holding your nose closed, and gently blowing as if you are trying to blow air out of your nose.  Do not do any of the following until your health care provider approves: ? Travel to high altitudes. ? Fly in airplanes. ? Work in a Estate agentpressurized cabin or room. ? Scuba dive.  Keep your ears dry. Dry your ears completely after showering or bathing.  Do not smoke.  Keep all follow-up visits as told by your health care provider. This is important. Contact a health care provider if:  Your symptoms  do not go away after treatment.  Your symptoms come back after treatment.  You are unable to pop your ears.  You have: ? A fever. ? Pain in your ear. ? Pain in your head or neck. ? Fluid draining from your ear.  Your hearing suddenly changes.  You become very dizzy.  You lose your balance. This information is not intended to replace advice given to you by your health care provider. Make sure you discuss any questions you have with your  health care provider. Document Released: 09/11/2015 Document Revised: 01/21/2016 Document Reviewed: 09/03/2014 Elsevier Interactive Patient Education  [employer].

## 2018-07-31 NOTE — Progress Notes (Signed)
  Subjective:     Patient ID: Ashley Harrison , female    DOB: 05-Nov-1970 , 47 y.o.   MRN: [id number]   Chief Complaint  Patient presents with  . Tinnitus    Patient states she has been having some echoing in her ears that started on sunday.    HPI Ears feeling echo x 3 days. Denies URI    Past Medical History:  Diagnosis Date  . Hypertension   . Iron deficiency anemia due to chronic blood loss 10/31/2017  . Iron deficiency anemia, unspecified   . Menometrorrhagia 10/31/2017  . Obesity      Family History  Problem Relation Age of Onset  . Diabetes Mother   . Hyperlipidemia Father   . Hypertension Father      Current Outpatient Medications:  .  hydrochlorothiazide (HYDRODIURIL) 12.5 MG tablet, Take 12.5 mg by mouth daily., Disp: , Rfl:  .  telmisartan-hydrochlorothiazide (MICARDIS HCT) 40-12.5 MG tablet, Take 1 tablet by mouth daily., Disp: , Rfl:    Allergies  Allergen Reactions  . Shellfish Allergy Hives     Review of Systems  Constitutional: Negative for chills, diaphoresis and fever.  HENT: Negative for congestion, ear discharge, ear pain, postnasal drip, rhinorrhea, sinus pain, sore throat, tinnitus and trouble swallowing.   Respiratory: Positive for cough.        Mild  Musculoskeletal: Negative for gait problem.  Skin: Negative for rash.  Neurological: Negative for dizziness.  Hematological: Negative for adenopathy.     Today's Vitals   07/31/18 1441  BP: 130/90  Pulse: 84  Temp: (!) 97.5 F (36.4 C)  TempSrc: Oral  SpO2: 95%  Weight: (!) 302 lb 3.2 oz (137.1 kg)  Height: 5\' 2"  (1.575 m)  PainSc: 2   PainLoc: Ear   Body mass index is 55.27 kg/m.   Objective:  Physical Exam  Constitutional: She is oriented to person, place, and time. She appears well-developed and well-nourished. No distress.  HENT:  Head: Normocephalic.  Right Ear: External ear normal.  Left Ear: External ear normal.  Nose: Nose normal.  Mouth/Throat: Oropharynx is clear and  moist.  No tenderness with external ear movement.  Both TM;s gray, R slightly less shiny than L.   Eyes: Conjunctivae are normal. No scleral icterus.  Neck: Neck supple.  Cardiovascular: Normal rate and regular rhythm.  No murmur heard. Pulmonary/Chest: Effort normal and breath sounds normal. She has no wheezes.  Lymphadenopathy:    She has no cervical adenopathy.  Neurological: She is alert and oriented to person, place, and time.  Skin: Skin is warm and dry. She is not diaphoretic.  Psychiatric: She has a normal mood and affect. Her behavior is normal. Judgment and thought content normal.  Nursing note and vitals reviewed.   Assessment And Plan:     1. Eustachian tube dysfunction, bilateral- acute. I advised her to use Flonase 2 sprays each nostril qd x 7 days.   Xayla Puzio RODRIGUEZ-SOUTHWORTH, PA-C

## 2018-08-20 DIAGNOSIS — J3089 Other allergic rhinitis: Secondary | ICD-10-CM | POA: Diagnosis not present

## 2018-08-20 DIAGNOSIS — Z91013 Allergy to seafood: Secondary | ICD-10-CM | POA: Diagnosis not present

## 2018-08-28 ENCOUNTER — Encounter: Payer: Self-pay | Admitting: Internal Medicine

## 2018-09-04 ENCOUNTER — Other Ambulatory Visit: Payer: 59

## 2018-09-04 ENCOUNTER — Ambulatory Visit: Payer: 59 | Admitting: Family

## 2018-09-12 ENCOUNTER — Inpatient Hospital Stay (HOSPITAL_BASED_OUTPATIENT_CLINIC_OR_DEPARTMENT_OTHER): Payer: 59 | Admitting: Family

## 2018-09-12 ENCOUNTER — Other Ambulatory Visit: Payer: Self-pay

## 2018-09-12 ENCOUNTER — Inpatient Hospital Stay: Payer: 59 | Attending: Family

## 2018-09-12 ENCOUNTER — Encounter: Payer: Self-pay | Admitting: Family

## 2018-09-12 VITALS — BP 128/62 | HR 84 | Temp 97.8°F | Resp 16 | Wt 303.8 lb

## 2018-09-12 DIAGNOSIS — R5383 Other fatigue: Secondary | ICD-10-CM

## 2018-09-12 DIAGNOSIS — Z79899 Other long term (current) drug therapy: Secondary | ICD-10-CM | POA: Diagnosis not present

## 2018-09-12 DIAGNOSIS — D500 Iron deficiency anemia secondary to blood loss (chronic): Secondary | ICD-10-CM

## 2018-09-12 DIAGNOSIS — N921 Excessive and frequent menstruation with irregular cycle: Secondary | ICD-10-CM

## 2018-09-12 DIAGNOSIS — D509 Iron deficiency anemia, unspecified: Secondary | ICD-10-CM | POA: Insufficient documentation

## 2018-09-12 LAB — CBC WITH DIFFERENTIAL (CANCER CENTER ONLY)
Abs Immature Granulocytes: 0.03 10*3/uL (ref 0.00–0.07)
Basophils Absolute: 0 10*3/uL (ref 0.0–0.1)
Basophils Relative: 0 %
Eosinophils Absolute: 0.2 10*3/uL (ref 0.0–0.5)
Eosinophils Relative: 2 %
HCT: 39.2 % (ref 36.0–46.0)
Hemoglobin: 12.4 g/dL (ref 12.0–15.0)
Immature Granulocytes: 0 %
Lymphocytes Relative: 40 %
Lymphs Abs: 3 10*3/uL (ref 0.7–4.0)
MCH: 27.3 pg (ref 26.0–34.0)
MCHC: 31.6 g/dL (ref 30.0–36.0)
MCV: 86.3 fL (ref 80.0–100.0)
MONO ABS: 0.5 10*3/uL (ref 0.1–1.0)
MONOS PCT: 6 %
Neutro Abs: 3.9 10*3/uL (ref 1.7–7.7)
Neutrophils Relative %: 52 %
Platelet Count: 293 10*3/uL (ref 150–400)
RBC: 4.54 MIL/uL (ref 3.87–5.11)
RDW: 14.1 % (ref 11.5–15.5)
WBC Count: 7.6 10*3/uL (ref 4.0–10.5)
nRBC: 0 % (ref 0.0–0.2)

## 2018-09-12 NOTE — Progress Notes (Signed)
Hematology and Oncology Follow Up Visit  Ashley Harrison [id number] 03-Feb-1971 48 y.o. 09/12/2018   Principle Diagnosis:  Iron deficiency anemia  Current Therapy:   IV iron as indicated   Interim History: Ashley Harrison is here today for follow-up. She states that she has been on her cycle for 2 weeks now and has an appointment with her gynecologist set up for February for further evaluation.  No other episodes of bleeding, no bruising or petechiae.  She has had some mild fatigue at times.  No fever, chills, n/v, cough, rash, dizziness, SOB, chest pain, palpitations, abdominal pain or changes in bowel or bladder habits.  No tenderness, numbness or tingling in her extremities at this time.  She has some puffiness in her lower extremities that comes and goes. She takes her HCTZ as well as Micardis HCT daily as prescribed.  No lymphadenopathy noted on exam.  She has maintained a good appetite and is staying well hydrated. Her weight is stable.   ECOG Performance Status: 1 - Symptomatic but completely ambulatory  Medications:  Allergies as of 09/12/2018      Reactions   Shellfish Allergy Hives      Medication List       Accurate as of September 12, 2018  2:22 PM. Always use your most recent med list.        hydrochlorothiazide 12.5 MG tablet Commonly known as:  HYDRODIURIL Take 12.5 mg by mouth daily.   telmisartan-hydrochlorothiazide 40-12.5 MG tablet Commonly known as:  MICARDIS HCT Take 1 tablet by mouth daily.       Allergies:  Allergies  Allergen Reactions  . Shellfish Allergy Hives    Past Medical History, Surgical history, Social history, and Family History were reviewed and updated.  Review of Systems: All other 10 point review of systems is negative.   Physical Exam:  weight is 303 lb 12.8 oz (137.8 kg) (abnormal). Her oral temperature is 97.8 F (36.6 C). Her blood pressure is 128/62 and her pulse is 84. Her respiration is 16 and oxygen saturation is  98%.   Wt Readings from Last 3 Encounters:  09/12/18 (!) 303 lb 12.8 oz (137.8 kg)  07/31/18 (!) 302 lb 3.2 oz (137.1 kg)  06/05/18 (!) 301 lb 3.2 oz (136.6 kg)    Ocular: Sclerae unicteric, pupils equal, round and reactive to light Ear-nose-throat: Oropharynx clear, dentition fair Lymphatic: No cervical, supraclavicular or axillary adenopathy Lungs no rales or rhonchi, good excursion bilaterally Heart regular rate and rhythm, no murmur appreciated Abd soft, nontender, positive bowel sounds, no liver or spleen tip palpated on exam, no fluid wave MSK no focal spinal tenderness, no joint edema Neuro: non-focal, well-oriented, appropriate affect Breasts: Deferred   Lab Results  Component Value Date   WBC 6.5 05/29/2018   HGB 12.1 05/29/2018   HCT 38.3 05/29/2018   MCV 86.5 05/29/2018   PLT 274 05/29/2018   Lab Results  Component Value Date   FERRITIN 413 (H) 05/29/2018   IRON 80 05/29/2018   TIBC 299 05/29/2018   UIBC 219 05/29/2018   IRONPCTSAT 27 05/29/2018   Lab Results  Component Value Date   RETICCTPCT 1.7 10/31/2017   RBC 4.43 05/29/2018   No results found for: KPAFRELGTCHN, LAMBDASER, KAPLAMBRATIO No results found for: IGGSERUM, IGA, IGMSERUM No results found for: Dorene ArOTALPROTELP, ALBUMINELP, A1GS, A2GS, BETS, BETA2SER, GAMS, MSPIKE, SPEI   Chemistry      Component Value Date/Time   NA 143 06/05/2018 1451   K 4.4 06/05/2018  1451   CL 99 06/05/2018 1451   CO2 23 06/05/2018 1451   BUN 9 06/05/2018 1451   CREATININE 0.79 06/05/2018 1451   CREATININE 0.60 02/26/2018 1505      Component Value Date/Time   CALCIUM 9.7 06/05/2018 1451   ALKPHOS 89 (H) 02/26/2018 1505   AST 17 02/26/2018 1505   ALT 23 02/26/2018 1505   BILITOT 0.8 02/26/2018 1505       Impression and Plan: Ashley Harrison is a very pleasant 48 yo African American femalew ith long history of iron deficiency anemia. She has been on her cycle now for 2 weeks and has had some mild fatigue.  She made an  appointment with her gynecologist in February.  We will see what her iron studies show and bring her back in for infusion if needed.  We will go ahead and plan to see her in another 3 months.  She will contact our office with any questions or concerns. We can certainly see her sooner if need be.   Emeline Gins, NP 1/15/20202:22 PM

## 2018-09-13 ENCOUNTER — Telehealth: Payer: Self-pay | Admitting: Family

## 2018-09-13 LAB — IRON AND TIBC
Iron: 48 ug/dL (ref 41–142)
Saturation Ratios: 16 % — ABNORMAL LOW (ref 21–57)
TIBC: 303 ug/dL (ref 236–444)
UIBC: 254 ug/dL (ref 120–384)

## 2018-09-13 LAB — FERRITIN: Ferritin: 273 ng/mL (ref 11–307)

## 2018-09-13 NOTE — Telephone Encounter (Signed)
LMVM for patient regarding appointment added per 1/16 sch msg

## 2018-09-17 ENCOUNTER — Inpatient Hospital Stay: Payer: 59

## 2018-09-17 VITALS — BP 124/71 | HR 81 | Temp 97.9°F | Resp 17

## 2018-09-17 DIAGNOSIS — D500 Iron deficiency anemia secondary to blood loss (chronic): Secondary | ICD-10-CM

## 2018-09-17 DIAGNOSIS — N921 Excessive and frequent menstruation with irregular cycle: Secondary | ICD-10-CM

## 2018-09-17 DIAGNOSIS — D508 Other iron deficiency anemias: Secondary | ICD-10-CM

## 2018-09-17 MED ORDER — SODIUM CHLORIDE 0.9 % IV SOLN
510.0000 mg | Freq: Once | INTRAVENOUS | Status: DC
  Filled 2018-09-17: qty 17

## 2018-09-17 MED ORDER — SODIUM CHLORIDE 0.9 % IV SOLN
INTRAVENOUS | Status: DC
  Filled 2018-09-17: qty 250

## 2018-09-17 NOTE — Progress Notes (Signed)
IV attempts unsuccessful  x 3 per 3 separate RN's.  Pt instructed to go home, hydrate for the evening and tomorrow and come back tomorrow at 1:00PM for Feraheme infusion.  Pt verbalizes an understanding of hydration and will return tomorrow at 1:00PM.

## 2018-09-17 NOTE — Patient Instructions (Signed)

## 2018-09-18 ENCOUNTER — Inpatient Hospital Stay: Payer: 59

## 2018-09-20 ENCOUNTER — Inpatient Hospital Stay: Payer: 59

## 2018-09-20 VITALS — BP 126/72 | HR 72 | Temp 97.8°F | Resp 16

## 2018-09-20 DIAGNOSIS — D508 Other iron deficiency anemias: Secondary | ICD-10-CM

## 2018-09-20 DIAGNOSIS — N921 Excessive and frequent menstruation with irregular cycle: Secondary | ICD-10-CM

## 2018-09-20 DIAGNOSIS — D500 Iron deficiency anemia secondary to blood loss (chronic): Secondary | ICD-10-CM

## 2018-09-20 DIAGNOSIS — D509 Iron deficiency anemia, unspecified: Secondary | ICD-10-CM | POA: Diagnosis not present

## 2018-09-20 MED ORDER — SODIUM CHLORIDE 0.9 % IV SOLN
510.0000 mg | Freq: Once | INTRAVENOUS | Status: AC
  Administered 2018-09-20: 510 mg via INTRAVENOUS
  Filled 2018-09-20: qty 17

## 2018-09-20 MED ORDER — SODIUM CHLORIDE 0.9 % IV SOLN
INTRAVENOUS | Status: DC
  Administered 2018-09-20: 09:00:00 via INTRAVENOUS
  Filled 2018-09-20: qty 250

## 2018-09-20 NOTE — Patient Instructions (Signed)

## 2018-10-03 DIAGNOSIS — Z01419 Encounter for gynecological examination (general) (routine) without abnormal findings: Secondary | ICD-10-CM | POA: Diagnosis not present

## 2018-10-03 DIAGNOSIS — Z124 Encounter for screening for malignant neoplasm of cervix: Secondary | ICD-10-CM | POA: Diagnosis not present

## 2018-10-03 DIAGNOSIS — Z6841 Body Mass Index (BMI) 40.0 and over, adult: Secondary | ICD-10-CM | POA: Diagnosis not present

## 2018-10-03 DIAGNOSIS — Z1231 Encounter for screening mammogram for malignant neoplasm of breast: Secondary | ICD-10-CM | POA: Diagnosis not present

## 2018-10-09 ENCOUNTER — Ambulatory Visit: Payer: 59 | Admitting: Internal Medicine

## 2018-10-09 ENCOUNTER — Encounter: Payer: Self-pay | Admitting: Internal Medicine

## 2018-10-09 VITALS — BP 126/88 | HR 68 | Temp 98.1°F | Ht 62.0 in | Wt 303.4 lb

## 2018-10-09 DIAGNOSIS — R0683 Snoring: Secondary | ICD-10-CM | POA: Diagnosis not present

## 2018-10-09 DIAGNOSIS — R7309 Other abnormal glucose: Secondary | ICD-10-CM | POA: Diagnosis not present

## 2018-10-09 DIAGNOSIS — I10 Essential (primary) hypertension: Secondary | ICD-10-CM

## 2018-10-09 DIAGNOSIS — E66813 Obesity, class 3: Secondary | ICD-10-CM

## 2018-10-09 DIAGNOSIS — Z6841 Body Mass Index (BMI) 40.0 and over, adult: Secondary | ICD-10-CM

## 2018-10-09 NOTE — Patient Instructions (Signed)

## 2018-10-10 LAB — BMP8+EGFR
BUN/Creatinine Ratio: 12 (ref 9–23)
BUN: 8 mg/dL (ref 6–24)
CO2: 24 mmol/L (ref 20–29)
CREATININE: 0.66 mg/dL (ref 0.57–1.00)
Calcium: 9.3 mg/dL (ref 8.7–10.2)
Chloride: 103 mmol/L (ref 96–106)
GFR calc Af Amer: 120 mL/min/{1.73_m2} (ref >59)
GFR, EST NON AFRICAN AMERICAN: 104 mL/min/{1.73_m2} (ref >59)
Glucose: 90 mg/dL (ref 65–99)
Potassium: 4.2 mmol/L (ref 3.5–5.2)
Sodium: 142 mmol/L (ref 134–144)

## 2018-10-10 LAB — HEMOGLOBIN A1C
Est. average glucose Bld gHb Est-mCnc: 117 mg/dL
Hgb A1c MFr Bld: 5.7 % — ABNORMAL HIGH (ref 4.8–5.6)

## 2018-10-10 LAB — HM PAP SMEAR

## 2018-10-13 ENCOUNTER — Encounter: Payer: Self-pay | Admitting: Internal Medicine

## 2018-10-21 ENCOUNTER — Telehealth: Payer: Self-pay | Admitting: Internal Medicine

## 2018-10-21 NOTE — Telephone Encounter (Signed)
pls notify pt - last echo on record was in 2013. Performed at Dr. Verl Dicker office.

## 2018-10-21 NOTE — Progress Notes (Signed)
Subjective:     Patient ID: Ashley Harrison , female    DOB: 03-20-1971 , 48 y.o.   MRN: [id number]   Chief Complaint  Patient presents with  . Hypertension    HPI  Hypertension  This is a chronic problem. The current episode started more than 1 year ago. The problem has been gradually improving since onset. The problem is controlled. Pertinent negatives include no blurred vision, chest pain, palpitations or shortness of breath. Risk factors for coronary artery disease include obesity and sedentary lifestyle.     Past Medical History:  Diagnosis Date  . Hypertension   . Iron deficiency anemia due to chronic blood loss 10/31/2017  . Iron deficiency anemia, unspecified   . Menometrorrhagia 10/31/2017  . Obesity      Family History  Problem Relation Age of Onset  . Diabetes Mother   . Hyperlipidemia Father   . Hypertension Father      Current Outpatient Medications:  .  hydrochlorothiazide (HYDRODIURIL) 12.5 MG tablet, Take 12.5 mg by mouth daily., Disp: , Rfl:  .  telmisartan-hydrochlorothiazide (MICARDIS HCT) 40-12.5 MG tablet, Take 1 tablet by mouth daily., Disp: , Rfl:    Allergies  Allergen Reactions  . Shellfish Allergy Hives     Review of Systems  Constitutional: Negative.   Eyes: Negative for blurred vision.  Respiratory: Negative.  Negative for shortness of breath.   Cardiovascular: Negative.  Negative for chest pain and palpitations.  Gastrointestinal: Negative.   Neurological: Negative.   Psychiatric/Behavioral: Negative.      Today's Vitals   10/09/18 0901  BP: 126/88  Pulse: 68  Temp: 98.1 F (36.7 C)  TempSrc: Oral  Weight: (!) 303 lb 6.4 oz (137.6 kg)  Height: 5' 2"  (1.575 m)   Body mass index is 55.49 kg/m.   Objective:  Physical Exam Vitals signs and nursing note reviewed.  Constitutional:      Appearance: Normal appearance.  HENT:     Head: Normocephalic and atraumatic.  Cardiovascular:     Rate and Rhythm: Normal rate and regular  rhythm.     Heart sounds: Normal heart sounds.  Pulmonary:     Effort: Pulmonary effort is normal.     Breath sounds: Normal breath sounds.  Skin:    General: Skin is warm.  Neurological:     General: No focal deficit present.     Mental Status: She is alert.  Psychiatric:        Mood and Affect: Mood normal.        Behavior: Behavior normal.         Assessment And Plan:     1. Essential hypertension, benign  Fair control. She is encouraged to avoid adding salt to her foods, take meds as directed, and exercise no less than 150 minutes weekly.   - BMP8+EGFR  2. Other abnormal glucose  HER A1C HAS BEEN ELEVATED IN THE PAST. I WILL CHECK AN A1C, BMET TODAY. SHE WAS ENCOURAGED TO AVOID SUGARY BEVERAGES AND PROCESSED FOODS INCLUDNG BREADS, RICE AND PASTA.  - Hemoglobin A1c  3. Snoring  I will refer her to Neuro for sleep study evaluation.   - Ambulatory referral to Neurology  4. Class 3 severe obesity due to excess calories with serious comorbidity and body mass index (BMI) of 50.0 to 59.9 in adult [facility])  Importance of achieving optimal weight to decrease risk of cardiovascular disease and cancers was discussed with the patient in full detail. She is encouraged to start  slowly - start with 10 minutes twice daily at least three to four days per week and to gradually build to 30 minutes five days weekly. She was given tips to incorporate more activity into her daily routine - take stairs when possible, park farther away from her job, grocery stores, etc.   [name], MD

## 2018-12-12 ENCOUNTER — Other Ambulatory Visit: Payer: 59

## 2018-12-12 ENCOUNTER — Ambulatory Visit: Payer: 59 | Admitting: Family

## 2019-02-04 ENCOUNTER — Telehealth: Payer: Self-pay | Admitting: Neurology

## 2019-02-04 NOTE — Telephone Encounter (Signed)
Due to current COVID 19 pandemic, our office is severely reducing in office visits until further notice, in order to minimize the risk to our patients and healthcare providers.   Called patient to reschedule her 6/25 appt (due to Dr. Brett Fairy being out) and to offer virtual visit. Patient accepted and verbalized understanding of the doxy process. Appt was rescheduled for 6/17. I have sent patient an e-mail with link and instructions, as well as my name and office number/hours. Patient understands that she will receive a call from RN prior to appt.  Pt understands that although there may be some limitations with this type of visit, we will take all precautions to reduce any security or privacy concerns.  Pt understands that this will be treated like an in office visit and we will file with pt's insurance, and there may be a patient responsible charge related to this service.

## 2019-02-11 ENCOUNTER — Encounter: Payer: Self-pay | Admitting: Neurology

## 2019-02-11 NOTE — Telephone Encounter (Signed)
Called the patient to review their chart and made sure that everything was up to date. Patient informed they received the e-mail/text message for the visit. Instructed to make sure they hold on to the e-mail/text for the upcoming appointment as it is necessary to access their appointment. Instructed the patient that apx 30 min prior to the appointment the front staff will contact them to make sure they are ready to go for their appointment in case there is any need for troubleshooting it can be completed prior to the appointment time. Reminded the patient once more that this is treated as a Office visit and the patient must be prepared for the visit and ready at the time of their appointment preferably in a well lit area where they have good connection for the visit. Pt verbalized understanding. I have resent the link to email that was on file. Pt verbalized understanding.

## 2019-02-13 ENCOUNTER — Encounter: Payer: Self-pay | Admitting: Neurology

## 2019-02-13 ENCOUNTER — Other Ambulatory Visit: Payer: Self-pay

## 2019-02-13 ENCOUNTER — Ambulatory Visit (INDEPENDENT_AMBULATORY_CARE_PROVIDER_SITE_OTHER): Payer: 59 | Admitting: Neurology

## 2019-02-13 DIAGNOSIS — D500 Iron deficiency anemia secondary to blood loss (chronic): Secondary | ICD-10-CM

## 2019-02-13 DIAGNOSIS — R5383 Other fatigue: Secondary | ICD-10-CM

## 2019-02-13 DIAGNOSIS — I10 Essential (primary) hypertension: Secondary | ICD-10-CM

## 2019-02-13 DIAGNOSIS — R0683 Snoring: Secondary | ICD-10-CM

## 2019-02-13 DIAGNOSIS — G479 Sleep disorder, unspecified: Secondary | ICD-10-CM

## 2019-02-13 DIAGNOSIS — N921 Excessive and frequent menstruation with irregular cycle: Secondary | ICD-10-CM | POA: Diagnosis not present

## 2019-02-13 DIAGNOSIS — Z6841 Body Mass Index (BMI) 40.0 and over, adult: Secondary | ICD-10-CM

## 2019-02-13 NOTE — Progress Notes (Signed)
SLEEP MEDICINE CLINIC   Provider:  Melvyn Novasarmen  Aminata Buffalo, M D  Primary Care Physician:  Dorothyann PengSanders, Robyn, MD   Referring Provider:  Dr Victorino Decemberobyn Sander, MD  Virtual Visit via Video Note  I connected with@ on 02/13/19 at  3:00 PM EDT by a video enabled telemedicine application and verified that I am speaking with the correct person using two identifiers.  Location: Patient: at home  Provider: at [facility]   I discussed the limitations of evaluation and management by telemedicine and the availability of in person appointments. The patient expressed understanding and agreed to proceed.  [name], MD   HPI:  Ashley Harrison is a 48 y.o. female , seen here as in a referral from Dr. Allyne GeeSanders for a sleep evaluation.  The patient carries a diagnosis of iron deficiency anemia, heavy menstrual periods, HTN and obesity. She has 2 children and delivered the second child by c- section.    Chief complaint according to patient : my sleep is not refreshing.   Sleep and medical history: the patient has never had a sleep test before. In childhood and youth she had no  trouble sleeping, it began in her 1540s. She felt not nearly as fatigued when her sons were young.  BMI is 55.5 - she gained weight in peri-menopause. HTN developed after first pregnancy.  Family sleep and medical history: no OSA known, no insomnia, no sleep walking. HTN in mother, who passed at age 50 of a stroke, CAD, DM , CKD, ESRD - on dialysis.   Social history: sons are 7414 and 1918. Working full time outside the home. Household size is 3 - her and her sons.  No pets. She works 8-5, in a place without day light.  She is divorced,  non smoker, ETOH use - none,  caffeine- in form of coffee in AM ( 1 cup) and soda after lunch ( 4 times a week) .    Sleep habits are as follows: Dinner time is 6-7.30 PM, her bedtime is 10-11 PM, and she describes her bedroom as warm, quiet but not dark she keeps the TV on.  She has 2 bathroom breaks most nights,  sleeps on her side and with 3 pillows. She has woken up with hot flashes , and a racing heart.  She dreams infrequently.  She wakes usually at 6 AM, spontaneously with an alarm as a back up.   Her feet swell over night, so she elevates her feet. She has no headaches , no dry mouth. Her sons report she snores.   TST 6 hours average. No daytime naps.       Review of Systems: Out of a complete 14 system review, the patient complains of only the following symptoms, and all other reviewed systems are negative.   How likely are you to doze in the following situations: 0 = not likely, 1 = slight chance, 2 = moderate chance, 3 = high chance  Sitting and Reading? Watching Television? Sitting inactive in a public place (theater or meeting)? Lying down in the afternoon when circumstances permit? Sitting and talking to someone? Sitting quietly after lunch without alcohol? In a car, while stopped for a few minutes in traffic? As a passenger in a car for an hour without a break?  Total =  4/ 24   Acid reflux, ankle edema, nocturia, palpitations, menopausal hot flushes.   Social History   Socioeconomic History  . Marital status: Divorced    Spouse name: Not on file  .  Number of children: Not on file  . Years of education: Not on file  . Highest education level: Not on file  Occupational History  . Not on file  Social Needs  . Financial resource strain: Not on file  . Food insecurity    Worry: Not on file    Inability: Not on file  . Transportation needs    Medical: Not on file    Non-medical: Not on file  Tobacco Use  . Smoking status: Never Smoker  . Smokeless tobacco: Never Used  Substance and Sexual Activity  . Alcohol use: No  . Drug use: No  . Sexual activity: Not Currently    Birth control/protection: Surgical  Lifestyle  . Physical activity    Days per week: Not on file    Minutes per session: Not on file  . Stress: Not on file  Relationships  . Social Wellsite geologistconnections     Talks on phone: Not on file    Gets together: Not on file    Attends religious service: Not on file    Active member of club or organization: Not on file    Attends meetings of clubs or organizations: Not on file    Relationship status: Not on file  . Intimate partner violence    Fear of current or ex partner: Not on file    Emotionally abused: Not on file    Physically abused: Not on file    Forced sexual activity: Not on file  Other Topics Concern  . Not on file  Social History Narrative  . Not on file    Family History  Problem Relation Age of Onset  . Diabetes Mother   . Hyperlipidemia Father   . Hypertension Father     Past Medical History:  Diagnosis Date  . Hypertension   . Iron deficiency anemia due to chronic blood loss 10/31/2017  . Iron deficiency anemia, unspecified   . Menometrorrhagia 10/31/2017  . Obesity     Past Surgical History:  Procedure Laterality Date  . CESAREAN SECTION  01/2001, 05/2004    x 2  . HYSTEROSCOPY WITH NOVASURE N/A 02/06/2014   Procedure: HYSTEROSCOPY WITH NOVASURE;  Surgeon: Oliver PilaKathy W Richardson, MD;  Location: [facility];  Service: Gynecology;  Laterality: N/A;  1hr OR time  . TUBAL LIGATION  2005  . WISDOM TOOTH EXTRACTION  1988    Current Outpatient Medications  Medication Sig Dispense Refill  . hydrochlorothiazide (HYDRODIURIL) 12.5 MG tablet Take 12.5 mg by mouth daily.    Marland Kitchen. telmisartan-hydrochlorothiazide (MICARDIS HCT) 40-12.5 MG tablet Take 1 tablet by mouth daily.     No current facility-administered medications for this visit.     Allergies as of 02/13/2019 - Review Complete 02/11/2019  Allergen Reaction Noted  . Shellfish allergy Hives 10/31/2017     Observation:  General: The patient is awake, alert and appears not in acute distress. The patient is groomed. Head: Normocephalic, atraumatic. Neck is supple without ROM restriction.  Mallampati grade : 17"   Neck circumference: inches Nasal airflow is patent.  Retrognathia is seen.  Respiratory: Breath holding was possible for 24 seconds. Skin:  Without evidence of facial edema or rash  Neurologic exam : The patient is awake and alert, oriented to place and time.   Attention span & concentration ability appears normal.  Speech is fluent, without dysarthria, dysphonia or aphasia.  Mood and affect are appropriate.  Cranial nerves: No loss of taste or smell reported. Pupils are equal in  size and round.  Extraocular movements  in vertical and horizontal planes intact. Facial motor strength is symmetric and tongue and uvula move midline. Shoulder shrug was symmetrical.   Motor exam:   Normal muscle bulk and symmetric ROM ( range of movement) in upper/ lower extremities.  Coordination: Rapid alternating movements in the fingers/hands were normal.  Finger-to-nose maneuver demonstrated no evidence of ataxia, dysmetria or tremor.  Gait and station: Patient reportedly walks without assistive device.   Assessment and Plan:  There is more fatigue than actual sleepiness, partially due to deconditioning, partially to weight gain and to fragmented Sleep.  I asked her to try to leave the TV off and use a golden glow night light , perhaps ab audio-book or meditation app. She has been at high risk for OSA, by BMI 55 and by neck and upper airway anatomy.     Follow Up Instructions: HST ordered    I discussed the assessment and treatment plan with the patient. The patient was provided an opportunity to ask questions and all were answered. The patient agreed with the plan and demonstrated an understanding of the instructions.   The patient was advised to call back or seek an in-person evaluation if the symptoms worsen or if the condition fails to improve as anticipated.  I provided 40 minutes of non-face-to-face time during this encounter.  Larey Seat, MD 4/33/2951, 8:84 PM  Certified in Neurology by ABPN Certified in [place] by [facility] [address] [place], [place] [address]

## 2019-02-13 NOTE — Patient Instructions (Signed)

## 2019-02-21 ENCOUNTER — Institutional Professional Consult (permissible substitution): Payer: 59 | Admitting: Neurology

## 2019-03-25 ENCOUNTER — Ambulatory Visit (INDEPENDENT_AMBULATORY_CARE_PROVIDER_SITE_OTHER): Payer: 59 | Admitting: Neurology

## 2019-03-25 DIAGNOSIS — N921 Excessive and frequent menstruation with irregular cycle: Secondary | ICD-10-CM

## 2019-03-25 DIAGNOSIS — G4733 Obstructive sleep apnea (adult) (pediatric): Secondary | ICD-10-CM | POA: Diagnosis not present

## 2019-03-25 DIAGNOSIS — Z6841 Body Mass Index (BMI) 40.0 and over, adult: Secondary | ICD-10-CM

## 2019-03-25 DIAGNOSIS — R0683 Snoring: Secondary | ICD-10-CM

## 2019-03-25 DIAGNOSIS — D500 Iron deficiency anemia secondary to blood loss (chronic): Secondary | ICD-10-CM

## 2019-03-25 DIAGNOSIS — I10 Essential (primary) hypertension: Secondary | ICD-10-CM

## 2019-03-25 DIAGNOSIS — G479 Sleep disorder, unspecified: Secondary | ICD-10-CM

## 2019-03-25 DIAGNOSIS — G4731 Primary central sleep apnea: Secondary | ICD-10-CM

## 2019-03-25 DIAGNOSIS — G4730 Sleep apnea, unspecified: Secondary | ICD-10-CM

## 2019-03-25 DIAGNOSIS — R5383 Other fatigue: Secondary | ICD-10-CM

## 2019-03-30 ENCOUNTER — Other Ambulatory Visit: Payer: Self-pay

## 2019-03-30 ENCOUNTER — Emergency Department (HOSPITAL_BASED_OUTPATIENT_CLINIC_OR_DEPARTMENT_OTHER)
Admission: EM | Admit: 2019-03-30 | Discharge: 2019-03-30 | Disposition: A | Discharge status: Home / Self Care | Payer: 59 | Attending: Emergency Medicine | Admitting: Emergency Medicine

## 2019-03-30 ENCOUNTER — Encounter (HOSPITAL_BASED_OUTPATIENT_CLINIC_OR_DEPARTMENT_OTHER): Payer: Self-pay | Admitting: Emergency Medicine

## 2019-03-30 DIAGNOSIS — I10 Essential (primary) hypertension: Secondary | ICD-10-CM | POA: Diagnosis not present

## 2019-03-30 DIAGNOSIS — R42 Dizziness and giddiness: Secondary | ICD-10-CM | POA: Diagnosis present

## 2019-03-30 DIAGNOSIS — E876 Hypokalemia: Secondary | ICD-10-CM

## 2019-03-30 DIAGNOSIS — Z79899 Other long term (current) drug therapy: Secondary | ICD-10-CM | POA: Diagnosis not present

## 2019-03-30 DIAGNOSIS — R5383 Other fatigue: Secondary | ICD-10-CM | POA: Insufficient documentation

## 2019-03-30 LAB — CBC WITH DIFFERENTIAL/PLATELET
Abs Immature Granulocytes: 0.02 10*3/uL (ref 0.00–0.07)
Basophils Absolute: 0 10*3/uL (ref 0.0–0.1)
Basophils Relative: 0 %
Eosinophils Absolute: 0.1 10*3/uL (ref 0.0–0.5)
Eosinophils Relative: 1 %
HCT: 40.2 % (ref 36.0–46.0)
Hemoglobin: 12.5 g/dL (ref 12.0–15.0)
Immature Granulocytes: 0 %
Lymphocytes Relative: 28 %
Lymphs Abs: 2.1 10*3/uL (ref 0.7–4.0)
MCH: 26.5 pg (ref 26.0–34.0)
MCHC: 31.1 g/dL (ref 30.0–36.0)
MCV: 85.2 fL (ref 80.0–100.0)
Monocytes Absolute: 0.4 10*3/uL (ref 0.1–1.0)
Monocytes Relative: 6 %
Neutro Abs: 4.9 10*3/uL (ref 1.7–7.7)
Neutrophils Relative %: 65 %
Platelets: 322 10*3/uL (ref 150–400)
RBC: 4.72 MIL/uL (ref 3.87–5.11)
RDW: 14 % (ref 11.5–15.5)
WBC: 7.6 10*3/uL (ref 4.0–10.5)
nRBC: 0 % (ref 0.0–0.2)

## 2019-03-30 LAB — BASIC METABOLIC PANEL
Anion gap: 14 (ref 5–15)
BUN: 10 mg/dL (ref 6–20)
CO2: 25 mmol/L (ref 22–32)
Calcium: 9.2 mg/dL (ref 8.9–10.3)
Chloride: 97 mmol/L — ABNORMAL LOW (ref 98–111)
Creatinine, Ser: 0.72 mg/dL (ref 0.44–1.00)
GFR calc Af Amer: >60 mL/min (ref >60)
GFR calc non Af Amer: >60 mL/min (ref >60)
Glucose, Bld: 123 mg/dL — ABNORMAL HIGH (ref 70–99)
Potassium: 3 mmol/L — ABNORMAL LOW (ref 3.5–5.1)
Sodium: 136 mmol/L (ref 135–145)

## 2019-03-30 LAB — TSH: TSH: 0.479 u[IU]/mL (ref 0.350–4.500)

## 2019-03-30 MED ORDER — POTASSIUM CHLORIDE CRYS ER 20 MEQ PO TBCR: 40.0000 meq | EXTENDED_RELEASE_TABLET | Freq: Every day | ORAL | 0 refills | Status: DC

## 2019-03-30 MED ORDER — POTASSIUM CHLORIDE CRYS ER 20 MEQ PO TBCR
40.0000 meq | EXTENDED_RELEASE_TABLET | Freq: Once | ORAL | Status: AC
  Administered 2019-03-30: 40 meq via ORAL
  Filled 2019-03-30: qty 2

## 2019-03-30 MED ORDER — SODIUM CHLORIDE 0.9 % IV BOLUS
1000.0000 mL | Freq: Once | INTRAVENOUS | Status: AC
  Administered 2019-03-30: 16:00:00 1000 mL via INTRAVENOUS

## 2019-03-30 NOTE — ED Notes (Signed)
Patient laid flat at this time for orthostatics.

## 2019-03-30 NOTE — ED Provider Notes (Signed)
[facility] EMERGENCY DEPARTMENT Provider Note   CSN: [id number] Arrival date & time: 03/30/19  1320     History   Chief Complaint Chief Complaint  Patient presents with  . Fatigue    HPI Ashley Harrison is a 48 y.o. female who presents with lightheadedness and fatigue.  Past medical history significant for iron deficiency anemia anemia due to heavy periods requiring blood transfusions in the past, hypertension.  Patient states that since Tuesday or Wednesday of this week she has had progressively worsening lightheadedness.  Symptoms are worse when she goes from sitting to standing.  She has not felt near syncopal or had any syncopal events but states that she has felt this way in the past when she is needed a blood transfusion.  She reports associated fatigue and feels like sometimes is hard to get out of bed.  She denies any feelings of depression.  Last time she required a blood transfusion was last October.  She gets iron infusions last time she had this done was in January.  She denies fever, chills, headache, chest pain, shortness of breath, abdominal pain, nausea, vomiting, diarrhea, urinary symptoms. She has not had a period in several months. No blood in the stool.     HPI  Past Medical History:  Diagnosis Date  . Hypertension   . Iron deficiency anemia due to chronic blood loss 10/31/2017  . Iron deficiency anemia, unspecified   . Menometrorrhagia 10/31/2017  . Obesity     Patient Active Problem List   Diagnosis Date Noted  . Essential hypertension, benign 10/09/2018  . Snoring 10/09/2018  . Class 3 severe obesity due to excess calories with serious comorbidity and body mass index (BMI) of 50.0 to 59.9 in adult (HCC) 10/09/2018  . Morbid obesity due to excess calories (HCC) 06/05/2018  . Iron deficiency anemia due to chronic blood loss 10/31/2017  . Menometrorrhagia 10/31/2017  . Anemia 09/21/2011    Past Surgical History:  Procedure Laterality Date  .  CESAREAN SECTION  01/2001, 05/2004    x 2  . HYSTEROSCOPY WITH NOVASURE N/A 02/06/2014   Procedure: HYSTEROSCOPY WITH NOVASURE;  Surgeon: Oliver PilaKathy W Richardson, MD;  Location: [facility];  Service: Gynecology;  Laterality: N/A;  1hr OR time  . TUBAL LIGATION  2005  . WISDOM TOOTH EXTRACTION  1988     OB History   No obstetric history on file.      Home Medications    Prior to Admission medications   Medication Sig Start Date End Date Taking? Authorizing Provider  telmisartan-hydrochlorothiazide (MICARDIS HCT) 40-12.5 MG tablet Take 1 tablet by mouth daily.   Yes [provider]  hydrochlorothiazide (HYDRODIURIL) 12.5 MG tablet Take 12.5 mg by mouth daily.    [provider]    Family History Family History  Problem Relation Age of Onset  . Diabetes Mother   . Hyperlipidemia Father   . Hypertension Father     Social History Social History   Tobacco Use  . Smoking status: Never Smoker  . Smokeless tobacco: Never Used  Substance Use Topics  . Alcohol use: No  . Drug use: No     Allergies   Shellfish allergy   Review of Systems Review of Systems  Constitutional: Positive for appetite change (decreased) and fatigue. Negative for fever.  Respiratory: Negative for shortness of breath.   Cardiovascular: Negative for chest pain.  Gastrointestinal: Negative for abdominal pain.  Neurological: Positive for light-headedness. Negative for syncope.  All  other systems reviewed and are negative.    Physical Exam Updated Vital Signs BP (!) 153/91 (BP Location: Left Arm)   Pulse 97   Temp 98.5 F (36.9 C) (Oral)   Resp 18   Ht 5\' 3"  (1.6 m)   Wt 131.5 kg   SpO2 99%   BMI 51.37 kg/m   Physical Exam Vitals signs and nursing note reviewed.  Constitutional:      General: She is not in acute distress.    Appearance: She is well-developed. She is obese. She is not ill-appearing.     Comments: Calm and cooperative. NAD  HENT:     Head: Normocephalic and  atraumatic.  Eyes:     General: No scleral icterus.       Right eye: No discharge.        Left eye: No discharge.     Conjunctiva/sclera: Conjunctivae normal.     Pupils: Pupils are equal, round, and reactive to light.  Neck:     Musculoskeletal: Normal range of motion.  Cardiovascular:     Rate and Rhythm: Normal rate and regular rhythm.  Pulmonary:     Effort: Pulmonary effort is normal. No respiratory distress.     Breath sounds: Normal breath sounds.  Abdominal:     General: There is no distension.  Skin:    General: Skin is warm and dry.  Neurological:     Mental Status: She is alert and oriented to person, place, and time.  Psychiatric:        Behavior: Behavior normal.      ED Treatments / Results  Labs (all labs ordered are listed, but only abnormal results are displayed) Labs Reviewed  BASIC METABOLIC PANEL - Abnormal; Notable for the following components:      Result Value   Potassium 3.0 (*)    Chloride 97 (*)    Glucose, Bld 123 (*)    All other components within normal limits  CBC WITH DIFFERENTIAL/PLATELET  TSH    EKG None  Radiology No results found.  Procedures Procedures (including critical care time)  Medications Ordered in ED Medications  sodium chloride 0.9 % bolus 1,000 mL (0 mLs Intravenous Stopped 03/30/19 1657)  potassium chloride SA (K-DUR) CR tablet 40 mEq (40 mEq Oral Given 03/30/19 1547)     Initial Impression / Assessment and Plan / ED Course  I have reviewed the triage vital signs and the nursing notes.  Pertinent labs & imaging results that were available during my care of the patient were reviewed by me and considered in my medical decision making (see chart for details).  48 year old female presents with lightheadedness and fatigue for several days.  She has 1 reading of high blood pressure here but most of her blood pressures are soft.  Other vitals are normal.  Her exam is unremarkable.  Orthostatic vital signs were obtained  and are negative  Orthostatic VS for the past 24 hrs (Last 3 readings):  BP- Lying Pulse- Lying BP- Sitting Pulse- Sitting BP- Standing at 0 minutes Pulse- Standing at 0 minutes BP- Standing at 3 minutes Pulse- Standing at 3 minutes  03/30/19 1526 114/70 85 104/66 83 115/81 93 109/80 82   Will obtain CBC, BMP, and a TSH  CBC is normal.  BMP is remarkable for hypokalemia (3).  Discussed with patient.  Offered IVF which she would like to do.  We will give dose of potassium as well.  We will have her hold her blood pressure  medicines today. She was given an rx for potassium and will call her doctor on Monday.  Final Clinical Impressions(s) / ED Diagnoses   Final diagnoses:  Lightheaded  Hypokalemia    ED Discharge Orders    None       Recardo Evangelist, PA-C 03/30/19 1740    Gareth Morgan, MD 04/01/19 1022

## 2019-03-30 NOTE — ED Triage Notes (Signed)
Pt c/o fatigue since Wednesday. Hx of anemia. Denies pain.

## 2019-03-30 NOTE — Discharge Instructions (Signed)
Please hold your blood pressure medicine today and you can restart tomorrow Take potassium for the next 5 days Please follow up with your doctor

## 2019-03-30 NOTE — ED Notes (Signed)
ED Provider at bedside. 

## 2019-03-31 DIAGNOSIS — G479 Sleep disorder, unspecified: Secondary | ICD-10-CM | POA: Insufficient documentation

## 2019-03-31 DIAGNOSIS — R5383 Other fatigue: Secondary | ICD-10-CM | POA: Insufficient documentation

## 2019-03-31 NOTE — Procedures (Signed)
Patient Information     First Name: Ashley BraunKaren Last Name: Harrison ID: [id number]  Birth Date: Aug 20, 1971 Age: [age] Gender: Female  Referring Provider: Dorothyann PengSanders, Robyn, MD BMI: 56.0 (W=304 lb, H=5' 2'')  Neck Circ.:  17 '' Epworth:  4/24   Sleep Study Information    Study Date: Mar 25, 2019 S/H/A Version: 5.1.77.7 / 4.1.1528 / 1777  History:    Ashley Harrison is a 48 y.o. African -American female and seen in a Video consultation on 02-13-2019 upon a referral by Dr. Allyne GeeSanders for a sleep evaluation.  The patient carries a diagnosis of iron deficiency anemia, heavy menstrual periods, HTN and obesity, GERD, ankle edema, nocturia, menopausal changes. She has 2 sons, 14 and 18. She reports her sleep is no longer refreshing and restoring. She wakes sometimes with a racing heartbeat and has gained weight, now between 50 and 60 BMI.       Summary & Diagnosis:     Severe sleep apnea with an AHI of 57.4 /h, with minor accentuation during REM sleep to AHI 63/h. 38 minutes of the recorded time were in hypoxia. 25% of all apneas were central. A loud level of snoring was noted.   Recommendations:     Complex and severe sleep apnea, best treated by positive airway pressure, not as likely to respond to a dental device or simple positional changes. The patient would benefit form weight loss.  Plan A) I recommend starting with an attended PAP titration study rather than auto- titrating CPAP, addressing possibly emerging central apneas.   Depending on insurance authorization, Plan B would be auto CPAP heated humidification and interface of Patient's choice. Starting with a pressure range of 6-18 cm water, 2 cm EPR.   Physician Name:  Melvyn Novasarmen Hiro Vipond, MD   03-30-2019           Sleep Summary    Oxygen Saturation Statistics     Start Study Time: End Study Time: Total Recording Time:  9:24:27 PM 6:29:57 AM 9 h, 5 min  Total Sleep Time % REM of Sleep Time:  7 h, 58 min 19.1    Mean: 93 Minimum: 74  Maximum: 100  Mean of Desaturations Nadirs (%):   90  Oxygen Desaturation. %:   4-9 10-20 >20 Total  Events Number Total   218  78 73.6 26.4  0 0.0  296 100.0  Oxygen Saturation: <90 <=88 <85 <80 <70  Duration (minutes): Sleep % 38.1 8.0 27.8 6.3 5.8 1.3 0.3 0.1 0.0 0.0     Respiratory Indices      Total Events REM NREM All Night  pRDI:  459 pAHI:  454 ODI:  296 pAHIc:  25 % CSR: 0.0 63.6 63.6 52.3 6.0 56.7 55.9 33.9 2.5 58.0 57.4 37.4 3.2       Pulse Rate Statistics during Sleep (BPM)      Mean: 72 Minimum: 50  Maximum: 114    Indices are calculated using technically valid sleep time of  7 hrs, 54 min.mRDI/pAHI are calculated using oxi desaturations ? 3%  Body Position Statistics  Position Supine Prone Right Left Non-Supine  Sleep (min) 265.0 0.0 0.0 213.5 213.5  Sleep % 55.4 0.0 0.0 44.6 44.6  pRDI 66.0 N/A N/A 48.1 48.1  pAHI 66.0 N/A N/A 46.7 46.7  ODI 50.9 N/A N/A 20.7 20.7     Snoring Statistics Snoring Level (dB) >40 >50 >60 >70 >80 >Threshold (45)  Sleep (min) 370.0 189.4 31.6 0.1 0.0 261.8  Sleep % 77.3 39.6 6.6 0.0 0.0 54.7    Mean: 48 dB Sleep Stages Chart                                                                             pAHI=57.4                                                                                              Mild              Moderate                    Severe                                                 5              15                    30

## 2019-03-31 NOTE — Addendum Note (Signed)
Addended by: Larey Seat on: 03/31/2019 08:19 PM   Modules accepted: Orders

## 2019-04-01 ENCOUNTER — Telehealth: Payer: Self-pay | Admitting: Neurology

## 2019-04-01 ENCOUNTER — Encounter: Payer: Self-pay | Admitting: Neurology

## 2019-04-01 DIAGNOSIS — G479 Sleep disorder, unspecified: Secondary | ICD-10-CM

## 2019-04-01 DIAGNOSIS — I10 Essential (primary) hypertension: Secondary | ICD-10-CM

## 2019-04-01 DIAGNOSIS — R0683 Snoring: Secondary | ICD-10-CM

## 2019-04-01 NOTE — Telephone Encounter (Signed)
I called pt. I advised pt that Dr. Brett Fairy reviewed their sleep study results and found that pt has severe sleep apnea. Dr. Brett Fairy recommends that pt starts auto CPAP. I reviewed PAP compliance expectations with the pt. Pt is agreeable to starting a CPAP. I advised pt that an order will be sent to a DME, [facility], and [facility] will call the pt within about one week after they file with the pt's insurance. [facility] will show the pt how to use the machine, fit for masks, and troubleshoot the CPAP if needed. A follow up appt was made for insurance purposes with Ward Givens, NP on Oct 13,2020 at 2:30 pm on [facility]. Pt verbalized understanding to arrive 15 minutes early and bring their CPAP. A letter with all of this information in it will be mailed to the pt as a reminder. I verified with the pt that the address we have on file is correct. Pt verbalized understanding of results. Pt had no questions at this time but was encouraged to call back if questions arise. I have sent the order to [facility] and have received confirmation that they have received the order.

## 2019-04-01 NOTE — Telephone Encounter (Signed)
Insurance will not cover for a cpap titration. Centrals has to be 50%. Pt only had 25% centrals. Pt needs to be on autopap.

## 2019-04-01 NOTE — Telephone Encounter (Signed)
-----   Message from Larey Seat, MD sent at 03/31/2019  8:19 PM EDT ----- Severe sleep apnea with an AHI of 57.4 /h, with minor accentuation during REM sleep to AHI 63/h. 38 minutes of the recorded time were in hypoxia. 25% of all apneas were central. A loud level of snoring was noted.   Plan A) I recommend starting with an attended PAP titration study rather than auto- titrating CPAP, addressing possibly emerging central apneas.

## 2019-04-03 ENCOUNTER — Other Ambulatory Visit: Payer: Self-pay

## 2019-04-03 ENCOUNTER — Encounter: Payer: Self-pay | Admitting: Internal Medicine

## 2019-04-03 ENCOUNTER — Ambulatory Visit: Payer: 59 | Admitting: Internal Medicine

## 2019-04-03 VITALS — Temp 98.2°F | Ht 63.0 in | Wt 301.2 lb

## 2019-04-03 DIAGNOSIS — Z09 Encounter for follow-up examination after completed treatment for conditions other than malignant neoplasm: Secondary | ICD-10-CM | POA: Diagnosis not present

## 2019-04-03 DIAGNOSIS — E876 Hypokalemia: Secondary | ICD-10-CM

## 2019-04-03 DIAGNOSIS — Z6841 Body Mass Index (BMI) 40.0 and over, adult: Secondary | ICD-10-CM

## 2019-04-03 DIAGNOSIS — Z87898 Personal history of other specified conditions: Secondary | ICD-10-CM | POA: Diagnosis not present

## 2019-04-03 DIAGNOSIS — I10 Essential (primary) hypertension: Secondary | ICD-10-CM | POA: Diagnosis not present

## 2019-04-03 DIAGNOSIS — I951 Orthostatic hypotension: Secondary | ICD-10-CM

## 2019-04-03 DIAGNOSIS — G4733 Obstructive sleep apnea (adult) (pediatric): Secondary | ICD-10-CM

## 2019-04-03 MED ORDER — MAGNESIUM 400 MG PO CAPS: 400.0000 mg | ORAL_CAPSULE | Freq: Every day | ORAL | 1 refills | Status: DC

## 2019-04-03 NOTE — Progress Notes (Signed)
Subjective:     Patient ID: Ashley Harrison , female    DOB: 03-16-1971 , 48 y.o.   MRN: [id number]   Chief Complaint  Patient presents with  . Dizziness    HPI  She is here today for f/u dizziness.  She was seen in ER on August 1st for further evaluation of lightheadedness, fatigue and dizziness that had started at least a week before. She has had similar sx in the past - has h/o iron deficiency anemia. She was found to NOT be anemic. She was given iv fluids which improved her symptoms. She has not taken her bp meds since ER visit. She has been feeling better since stopping the medication.     Past Medical History:  Diagnosis Date  . Hypertension   . Iron deficiency anemia due to chronic blood loss 10/31/2017  . Iron deficiency anemia, unspecified   . Menometrorrhagia 10/31/2017  . Obesity      Family History  Problem Relation Age of Onset  . Diabetes Mother   . Hyperlipidemia Father   . Hypertension Father      Current Outpatient Medications:  .  hydrochlorothiazide (HYDRODIURIL) 12.5 MG tablet, Take 12.5 mg by mouth daily., Disp: , Rfl:  .  potassium chloride SA (K-DUR) 20 MEQ tablet, Take 2 tablets (40 mEq total) by mouth daily., Disp: 5 tablet, Rfl: 0 .  telmisartan-hydrochlorothiazide (MICARDIS HCT) 40-12.5 MG tablet, Take 1 tablet by mouth daily., Disp: , Rfl:  .  Magnesium 400 MG CAPS, Take 400 mg by mouth daily., Disp: 90 capsule, Rfl: 1   Allergies  Allergen Reactions  . Shellfish Allergy Hives     Review of Systems  Constitutional: Negative.   Respiratory: Negative.   Cardiovascular: Negative.   Gastrointestinal: Negative.   Psychiatric/Behavioral: Negative.      Today's Vitals   04/03/19 1607  Temp: 98.2 F (36.8 C)  TempSrc: Oral  Weight: (!) 301 lb 3.2 oz (136.6 kg)  Height: _0  (1.6 m)   Body mass index is 53.36 kg/m.   Objective:  Physical Exam Vitals signs and nursing note reviewed.  Constitutional:      Appearance: Normal appearance.   HENT:     Head: Normocephalic and atraumatic.  Cardiovascular:     Rate and Rhythm: Normal rate and regular rhythm.     Heart sounds: Normal heart sounds.  Pulmonary:     Effort: Pulmonary effort is normal.     Breath sounds: Normal breath sounds.  Skin:    General: Skin is warm.  Neurological:     General: No focal deficit present.     Mental Status: She is alert.  Psychiatric:        Mood and Affect: Mood normal.        Behavior: Behavior normal.         Assessment And Plan:     1. Orthostatic hypotension  She is encouraged to increase her water intake to no less than 64 ounces per day. Recent ER records reviewed in full detail. She will not resume her bp meds at this time.   2. Essential hypertension, benign  Chronic. She will continue off of medication at this time. I will consider resuming 1/2 tab telmisartan/hct should her pressure start to go up again. She was advised to start magnesium 43m nightly.   3. Hypokalemia  ER records reviewed. I will recheck potassium level today. She is also encouraged to increase her intake of potassium rich foods.   -  BMP8+EGFR  4. Obstructive sleep apnea  Recently diagnosed via Home sleep study. Unfortunately, her insurance will not pay for in-house titration study. She is now followed by Neurology. She is encouraged to use cpap as directed, at least four hours per night. She has yet to receive the equipment.   5. Class 3 severe obesity due to excess calories with serious comorbidity and body mass index (BMI) of 50.0 to 59.9 in adult [facility])  Importance of achieving optimal weight to decrease risk of cardiovascular disease and cancers was discussed with the patient in full detail. She is encouraged to start slowly - start with 10 minutes twice daily at least three to four days per week and to gradually build to 30 minutes five days weekly. She was given tips to incorporate more activity into her daily routine - take stairs when  possible, park farther away from her job, grocery stores, etc.    [name], MD    THE PATIENT IS ENCOURAGED TO PRACTICE SOCIAL DISTANCING DUE TO THE COVID-19 PANDEMIC.

## 2019-04-03 NOTE — Patient Instructions (Signed)

## 2019-04-04 ENCOUNTER — Telehealth: Payer: Self-pay | Admitting: Family

## 2019-04-04 ENCOUNTER — Inpatient Hospital Stay: Payer: 59 | Attending: Family

## 2019-04-04 ENCOUNTER — Inpatient Hospital Stay (HOSPITAL_BASED_OUTPATIENT_CLINIC_OR_DEPARTMENT_OTHER): Payer: 59 | Admitting: Family

## 2019-04-04 VITALS — BP 154/75 | HR 72 | Temp 97.5°F | Resp 17 | Ht 63.0 in | Wt 300.0 lb

## 2019-04-04 DIAGNOSIS — D509 Iron deficiency anemia, unspecified: Secondary | ICD-10-CM | POA: Insufficient documentation

## 2019-04-04 DIAGNOSIS — N921 Excessive and frequent menstruation with irregular cycle: Secondary | ICD-10-CM

## 2019-04-04 DIAGNOSIS — D508 Other iron deficiency anemias: Secondary | ICD-10-CM | POA: Diagnosis not present

## 2019-04-04 DIAGNOSIS — D500 Iron deficiency anemia secondary to blood loss (chronic): Secondary | ICD-10-CM

## 2019-04-04 LAB — RETICULOCYTES
Immature Retic Fract: 22.4 % — ABNORMAL HIGH (ref 2.3–15.9)
RBC.: 4.25 MIL/uL (ref 3.87–5.11)
Retic Count, Absolute: 93.9 10*3/uL (ref 19.0–186.0)
Retic Ct Pct: 2.2 % (ref 0.4–3.1)

## 2019-04-04 LAB — CBC WITH DIFFERENTIAL (CANCER CENTER ONLY)
Abs Immature Granulocytes: 0.07 10*3/uL (ref 0.00–0.07)
Basophils Absolute: 0 10*3/uL (ref 0.0–0.1)
Basophils Relative: 0 %
Eosinophils Absolute: 0.2 10*3/uL (ref 0.0–0.5)
Eosinophils Relative: 2 %
HCT: 36.6 % (ref 36.0–46.0)
Hemoglobin: 11.5 g/dL — ABNORMAL LOW (ref 12.0–15.0)
Immature Granulocytes: 1 %
Lymphocytes Relative: 28 %
Lymphs Abs: 2.1 10*3/uL (ref 0.7–4.0)
MCH: 27 pg (ref 26.0–34.0)
MCHC: 31.4 g/dL (ref 30.0–36.0)
MCV: 85.9 fL (ref 80.0–100.0)
Monocytes Absolute: 0.5 10*3/uL (ref 0.1–1.0)
Monocytes Relative: 6 %
Neutro Abs: 4.9 10*3/uL (ref 1.7–7.7)
Neutrophils Relative %: 63 %
Platelet Count: 306 10*3/uL (ref 150–400)
RBC: 4.26 MIL/uL (ref 3.87–5.11)
RDW: 14.2 % (ref 11.5–15.5)
WBC Count: 7.8 10*3/uL (ref 4.0–10.5)
nRBC: 0 % (ref 0.0–0.2)

## 2019-04-04 LAB — BMP8+EGFR
BUN/Creatinine Ratio: 13 (ref 9–23)
BUN: 9 mg/dL (ref 6–24)
CO2: 22 mmol/L (ref 20–29)
Calcium: 9.2 mg/dL (ref 8.7–10.2)
Chloride: 103 mmol/L (ref 96–106)
Creatinine, Ser: 0.71 mg/dL (ref 0.57–1.00)
GFR calc Af Amer: 116 mL/min/{1.73_m2} (ref >59)
GFR calc non Af Amer: 100 mL/min/{1.73_m2} (ref >59)
Glucose: 108 mg/dL — ABNORMAL HIGH (ref 65–99)
Potassium: 4.2 mmol/L (ref 3.5–5.2)
Sodium: 140 mmol/L (ref 134–144)

## 2019-04-04 LAB — FERRITIN: Ferritin: 474 ng/mL — ABNORMAL HIGH (ref 11–307)

## 2019-04-04 LAB — IRON AND TIBC
Iron: 54 ug/dL (ref 41–142)
Saturation Ratios: 20 % — ABNORMAL LOW (ref 21–57)
TIBC: 271 ug/dL (ref 236–444)
UIBC: 218 ug/dL (ref 120–384)

## 2019-04-04 NOTE — Telephone Encounter (Signed)
Appointments scheduled LMVM w/ date/time per 8/6 los

## 2019-04-04 NOTE — Progress Notes (Signed)
Hematology and Oncology Follow Up Visit  Ashley Harrison [id number] [birth year] 48 y.o. 04/04/2019   Principle Diagnosis:   Iron deficiency anemia  Current Therapy:   IV iron as indicated  Interim History:  Ashley Harrison is here today for follow-up. She has noted fatigue, lightheadedness and occasional palpitations over the last week or so. She went to the ED last week and followed up with her PCP yesterday for hypertension. They stopped her antihypertensive meds and felt she had also been dehydrated so she has increased her fluid intake.  No falls or syncopal episodes.  She has not had a cycle this year. No episodes of bleeding, no bruising or petechiae.  No fever, chills, n/v, cough, rash, dizziness, SOB, chest pain, abdominal pain or changes in bowel or bladder habits.  No swelling, tenderness, numbness or tingling in her extremities.  She has maintained a good appetite and again is working to stay better hydrated. Her weight is stable.   ECOG Performance Status: 1 - Symptomatic but completely ambulatory  Medications:  Allergies as of 04/04/2019      Reactions   Shellfish Allergy Hives      Medication List       Accurate as of April 04, 2019  9:04 AM. If you have any questions, ask your nurse or doctor.        hydrochlorothiazide 12.5 MG tablet Commonly known as: HYDRODIURIL Take 12.5 mg by mouth daily.   Magnesium 400 MG Caps Take 400 mg by mouth daily.   potassium chloride SA 20 MEQ tablet Commonly known as: K-DUR Take 2 tablets (40 mEq total) by mouth daily.   telmisartan-hydrochlorothiazide 40-12.5 MG tablet Commonly known as: MICARDIS HCT Take 1 tablet by mouth daily.       Allergies:  Allergies  Allergen Reactions  . Shellfish Allergy Hives    Past Medical History, Surgical history, Social history, and Family History were reviewed and updated.  Review of Systems: All other 10 point review of systems is negative.   Physical Exam:  height is 5\' 3"   (1.6 m) and weight is 300 lb (136.1 kg). Her oral temperature is 97.5 F (36.4 C) (abnormal). Her blood pressure is 154/75 (abnormal) and her pulse is 72. Her respiration is 17 and oxygen saturation is 100%.   Wt Readings from Last 3 Encounters:  04/04/19 300 lb (136.1 kg)  04/03/19 (!) 301 lb 3.2 oz (136.6 kg)  03/30/19 290 lb (131.5 kg)    Ocular: Sclerae unicteric, pupils equal, round and reactive to light Ear-nose-throat: Oropharynx clear, dentition fair Lymphatic: No cervical or supraclavicular adenopathy Lungs no rales or rhonchi, good excursion bilaterally Heart regular rate and rhythm, no murmur appreciated Abd soft, nontender, positive bowel sounds, no liver or spleen tip palpated on exam, no fluid wave  MSK no focal spinal tenderness, no joint edema Neuro: non-focal, well-oriented, appropriate affect Breasts: Deferred   Lab Results  Component Value Date   WBC 7.8 04/04/2019   HGB 11.5 (L) 04/04/2019   HCT 36.6 04/04/2019   MCV 85.9 04/04/2019   PLT 306 04/04/2019   Lab Results  Component Value Date   FERRITIN 273 09/12/2018   IRON 48 09/12/2018   TIBC 303 09/12/2018   UIBC 254 09/12/2018   IRONPCTSAT 16 (L) 09/12/2018   Lab Results  Component Value Date   RETICCTPCT 2.2 04/04/2019   RBC 4.25 04/04/2019   RBC 4.26 04/04/2019   No results found for: KPAFRELGTCHN, LAMBDASER, KAPLAMBRATIO No results found for: IGGSERUM, IGA,  IGMSERUM No results found for: Marda StalkerOTALPROTELP, ALBUMINELP, A1GS, A2GS, BETS, BETA2SER, GAMS, MSPIKE, SPEI   Chemistry      Component Value Date/Time   NA 140 04/03/2019 1648   K 4.2 04/03/2019 1648   CL 103 04/03/2019 1648   CO2 22 04/03/2019 1648   BUN 9 04/03/2019 1648   CREATININE 0.71 04/03/2019 1648   CREATININE 0.60 02/26/2018 1505      Component Value Date/Time   CALCIUM 9.2 04/03/2019 1648   ALKPHOS 89 (H) 02/26/2018 1505   AST 17 02/26/2018 1505   ALT 23 02/26/2018 1505   BILITOT 0.8 02/26/2018 1505       Impression  and Plan: Ashley Harrison is a very pleasant 48 yo African American female with long history of iron deficiency anemia.  We will see what her iron studies show and bring her back in for infusion if needed.  We will plan to see her back in another 4 months.  She will contact our office with any questions or concerns. We can certainly see her sooner if needed.   Emeline GinsSarah Raigen Jagielski, NP 8/6/20209:04 AM

## 2019-04-04 NOTE — Telephone Encounter (Signed)
Called and spoke with patient regarding appointment added per 8/6 sch msg

## 2019-04-10 ENCOUNTER — Inpatient Hospital Stay: Payer: 59

## 2019-04-17 ENCOUNTER — Ambulatory Visit: Payer: 59

## 2019-04-17 ENCOUNTER — Other Ambulatory Visit: Payer: Self-pay

## 2019-04-17 VITALS — Temp 98.2°F | Ht 63.4 in | Wt 297.6 lb

## 2019-04-17 DIAGNOSIS — I10 Essential (primary) hypertension: Secondary | ICD-10-CM

## 2019-04-17 NOTE — Progress Notes (Signed)
Patient came in today for orthostatic blood pressure check. Provider notified of readings. Pt advised to start taking telmesartan-hctz as directed.

## 2019-05-06 ENCOUNTER — Other Ambulatory Visit: Payer: Self-pay | Admitting: Internal Medicine

## 2019-05-22 ENCOUNTER — Encounter: Payer: 59 | Admitting: Internal Medicine

## 2019-06-11 ENCOUNTER — Telehealth: Payer: Self-pay

## 2019-06-11 ENCOUNTER — Telehealth: Payer: Self-pay | Admitting: Adult Health

## 2019-06-11 NOTE — Telephone Encounter (Signed)
Spoke with Darnelle Bos from [facility] and he stated that he was unable to pull a download for the patient. He stated that he would try see what he can do on his end and fax me the download. I provided him the fax number for pod 3.

## 2019-06-12 ENCOUNTER — Telehealth: Payer: Self-pay

## 2019-06-12 ENCOUNTER — Ambulatory Visit: Payer: Self-pay | Admitting: Adult Health

## 2019-06-12 NOTE — Telephone Encounter (Signed)
Spoke with the patient and explained how we were still unable to get download off her chip from [employer]. I explained how she will need to come in the office for [place] to read the information off her chip. Patient agreed to come into the office. She has been scheduled for 06-18-2019 at 10 am. She has been advised to bring her machine with her. She agreed.

## 2019-06-17 ENCOUNTER — Telehealth: Payer: Self-pay | Admitting: Neurology

## 2019-06-17 NOTE — Telephone Encounter (Signed)
Pt called to cancel appt due to the cpap company not getting back with her about her machine not working correctly. Pt has been dealing with this situation for months and no one is getting back with her from the company. Please advise.

## 2019-06-17 NOTE — Telephone Encounter (Signed)
Called the patient and scheduled her visit again with Amy 10/22 at 7:45 am.advised the patient to bring the machine. I have sent the machine to [employer] to have them address it.

## 2019-06-18 ENCOUNTER — Ambulatory Visit: Payer: Self-pay | Admitting: Adult Health

## 2019-06-18 NOTE — Telephone Encounter (Signed)
Spoke with Jeneen Rinks from [employer], according to their notes they attempted to reach out the patient on 06/12/2019 and left a message requesting a call back for the patient to bring her machine in for them to address this concern with her.  I discussed with [employer] and confirmed that if patient will bring her machine in to her apt we can still complete a download off of the machine by reading the card.  Advised the patient yesterday when scheduled the apt to bring her machine and power cord the whole kit so that we can attempt download.   James from [employer] also stated and this can be mentioned at the visit that if there is in fact a machine issue with it downloading her information to care orchestrator then she can bring that machine to [employer] for them to send it back since it is still under warranty and they can reset her with a new machine.

## 2019-06-18 NOTE — Telephone Encounter (Signed)
Recevied another phone call from Marlow from [employer] stating he was able to get in touch with the [employer] of her machine and discussed with IT person and come to find out the modem to her machine has expired which is why the information will not connect over to the [employer].  Jeneen Rinks states he had their office call the patient to see if she could bring the machine in before the appt and that would allow them to fix that issue for her and download and send to [place]. I informed that her apt is on thur early morning and advised that she talked like her time was very limited on taking off from work. I did confirm that if the patient brings the machine into the office can we still get a download even if the modem has not bee changed and he confirmed that we can. When patient comes in thur am. We will get a download from the machine and then she will just need to touch base with [employer] when time allows for her to have the modem changed on her machine so that data moving forward can be read wirelessly. I will make sure that Amy is aware of this for the patient's apt to encourage her to take machine to [employer].

## 2019-06-19 DIAGNOSIS — G4733 Obstructive sleep apnea (adult) (pediatric): Secondary | ICD-10-CM | POA: Insufficient documentation

## 2019-06-19 DIAGNOSIS — Z9989 Dependence on other enabling machines and devices: Secondary | ICD-10-CM | POA: Insufficient documentation

## 2019-06-19 NOTE — Patient Instructions (Addendum)
We are going to continue working on compliance. Set small goal if needed. Work toward a minimum of 4 hour usage every night.   Keep an eye on BP at home, follow up with PCP  Follow up in 8 weeks on Mychart   Living With Sleep Apnea Sleep apnea is a condition in which breathing pauses or becomes shallow during sleep. Sleep apnea is most commonly caused by a collapsed or blocked airway. People with sleep apnea snore loudly and have times when they gasp and stop breathing for 10 seconds or more during sleep. This happens over and over during the night. This disrupts your sleep and keeps your body from getting the rest that it needs, which can cause tiredness and lack of energy (fatigue) during the day. The breaks in breathing also interrupt the deep sleep that you need to feel rested. Even if you do not completely wake up from the gaps in breathing, your sleep may not be restful. You may also have a headache in the morning and low energy during the day, and you may feel anxious or depressed. How can sleep apnea affect me? Sleep apnea increases your chances of extreme tiredness during the day (daytime fatigue). It can also increase your risk for health conditions, such as:  Heart attack.  Stroke.  Diabetes.  Heart failure.  Irregular heartbeat.  High blood pressure. If you have daytime fatigue as a result of sleep apnea, you may be more likely to:  Perform poorly at school or work.  Fall asleep while driving.  Have difficulty with attention.  Develop depression or anxiety.  Become severely overweight (obese).  Have sexual dysfunction. What actions can I take to manage sleep apnea? Sleep apnea treatment   If you were given a device to open your airway while you sleep, use it only as told by your health care provider. You may be given: ? An oral appliance. This is a custom-made mouthpiece that shifts your lower jaw forward. ? A continuous positive airway pressure (CPAP) device.  This device blows air through a mask when you breathe out (exhale). ? A nasal expiratory positive airway pressure (EPAP) device. This device has valves that you put into each nostril. ? A bi-level positive airway pressure (BPAP) device. This device blows air through a mask when you breathe in (inhale) and breathe out (exhale).  You may need surgery if other treatments do not work for you. Sleep habits  Go to sleep and wake up at the same time every day. This helps set your internal clock (circadian rhythm) for sleeping. ? If you stay up later than usual, such as on weekends, try to get up in the morning within 2 hours of your normal wake time.  Try to get at least 7-9 hours of sleep each night.  Stop computer, tablet, and mobile phone use a few hours before bedtime.  Do not take long naps during the day. If you nap, limit it to 30 minutes.  Have a relaxing bedtime routine. Reading or listening to music may relax you and help you sleep.  Use your bedroom only for sleep. ? Keep your television and computer out of your bedroom. ? Keep your bedroom cool, dark, and quiet. ? Use a supportive mattress and pillows.  Follow your health care provider's instructions for other changes to sleep habits. Nutrition  Do not eat heavy meals in the evening.  Do not have caffeine in the later part of the day. The effects of caffeine  can last for more than 5 hours.  Follow your health care provider's or dietitian's instructions for any diet changes. Lifestyle      Do not drink alcohol before bedtime. Alcohol can cause you to fall asleep at first, but then it can cause you to wake up in the middle of the night and have trouble getting back to sleep.  Do not use any products that contain nicotine or tobacco, such as cigarettes and e-cigarettes. If you need help quitting, ask your health care provider. Medicines  Take over-the-counter and prescription medicines only as told by your health care  provider.  Do not use over-the-counter sleep medicine. You can become dependent on this medicine, and it can make sleep apnea worse.  Do not use medicines, such as sedatives and narcotics, unless told by your health care provider. Activity  Exercise on most days, but avoid exercising in the evening. Exercising near bedtime can interfere with sleeping.  If possible, spend time outside every day. Natural light helps regulate your circadian rhythm. General information  Lose weight if you need to, and maintain a healthy weight.  Keep all follow-up visits as told by your health care provider. This is important.  If you are having surgery, make sure to tell your health care provider that you have sleep apnea. You may need to bring your device with you. Where to find more information Learn more about sleep apnea and daytime fatigue from:  American Sleep Association: sleepassociation.[place]: sleepfoundation.org  National Heart, Lung, and Blood Institute: https://www.[place].biz/ Summary  Sleep apnea can cause daytime fatigue and other serious health conditions.  Both sleep apnea and daytime fatigue can be bad for your health and well-being.  You may need to wear a device while sleeping to help keep your airway open.  If you are having surgery, make sure to tell your health care provider that you have sleep apnea. You may need to bring your device with you.  Making changes to sleep habits, diet, lifestyle, and activity can help you manage sleep apnea. This information is not intended to replace advice given to you by your health care provider. Make sure you discuss any questions you have with your health care provider. Document Released: 11/09/2017 Document Revised: 12/07/2018 Document Reviewed: 11/09/2017 Elsevier Patient Education  [place].   CPAP and BPAP Information CPAP and BPAP are methods of helping a person breathe with the use of air pressure. CPAP  stands for "continuous positive airway pressure." BPAP stands for "bi-level positive airway pressure." In both methods, air is blown through your nose or mouth and into your air passages to help you breathe well. CPAP and BPAP use different amounts of pressure to blow air. With CPAP, the amount of pressure stays the same while you breathe in and out. With BPAP, the amount of pressure is increased when you breathe in (inhale) so that you can take larger breaths. Your health care provider will recommend whether CPAP or BPAP would be more helpful for you. Why are CPAP and BPAP treatments used? CPAP or BPAP can be helpful if you have:  Sleep apnea.  Chronic obstructive pulmonary disease (COPD).  Heart failure.  Medical conditions that weaken the muscles of the chest including muscular dystrophy, or neurological diseases such as amyotrophic lateral sclerosis (ALS).  Other problems that cause breathing to be weak, abnormal, or difficult. CPAP is most commonly used for obstructive sleep apnea (OSA) to keep the airways from collapsing when the muscles  relax during sleep. How is CPAP or BPAP administered? Both CPAP and BPAP are provided by a small machine with a flexible plastic tube that attaches to a plastic mask. You wear the mask. Air is blown through the mask into your nose or mouth. The amount of pressure that is used to blow the air can be adjusted on the machine. Your health care provider will determine the pressure setting that should be used based on your individual needs. When should CPAP or BPAP be used? In most cases, the mask only needs to be worn during sleep. Generally, the mask needs to be worn throughout the night and during any daytime naps. People with certain medical conditions may also need to wear the mask at other times when they are awake. Follow instructions from your health care provider about when to use the machine. What are some tips for using the mask?   Because the mask  needs to be snug, some people feel trapped or closed-in (claustrophobic) when first using the mask. If you feel this way, you may need to get used to the mask. One way to do this is by holding the mask loosely over your nose or mouth and then gradually applying the mask more snugly. You can also gradually increase the amount of time that you use the mask.  Masks are available in various types and sizes. Some fit over your mouth and nose while others fit over just your nose. If your mask does not fit well, talk with your health care provider about getting a different one.  If you are using a mask that fits over your nose and you tend to breathe through your mouth, a chin strap may be applied to help keep your mouth closed.  The CPAP and BPAP machines have alarms that may sound if the mask comes off or develops a leak.  If you have trouble with the mask, it is very important that you talk with your health care provider about finding a way to make the mask easier to tolerate. Do not stop using the mask. Stopping the use of the mask could have a negative impact on your health. What are some tips for using the machine?  Place your CPAP or BPAP machine on a secure table or stand near an electrical outlet.  Know where the on/off switch is located on the machine.  Follow instructions from your health care provider about how to set the pressure on your machine and when you should use it.  Do not eat or drink while the CPAP or BPAP machine is on. Food or fluids could get pushed into your lungs by the pressure of the CPAP or BPAP.  Do not smoke. Tobacco smoke residue can damage the machine.  For home use, CPAP and BPAP machines can be rented or purchased through home health care companies. Many different brands of machines are available. Renting a machine before purchasing may help you find out which particular machine works well for you.  Keep the CPAP or BPAP machine and attachments clean. Ask your  health care provider for specific instructions. Get help right away if:  You have redness or open areas around your nose or mouth where the mask fits.  You have trouble using the CPAP or BPAP machine.  You cannot tolerate wearing the CPAP or BPAP mask.  You have pain, discomfort, and bloating in your abdomen. Summary  CPAP and BPAP are methods of helping a person breathe with the use  of air pressure.  Both CPAP and BPAP are provided by a small machine with a flexible plastic tube that attaches to a plastic mask.  If you have trouble with the mask, it is very important that you talk with your health care provider about finding a way to make the mask easier to tolerate. This information is not intended to replace advice given to you by your health care provider. Make sure you discuss any questions you have with your health care provider. Document Released: 05/13/2004 Document Revised: 12/05/2018 Document Reviewed: 07/04/2016 Elsevier Patient Education  2020 [employer]   Sleep Apnea Sleep apnea affects breathing during sleep. It causes breathing to stop for a short time or to become shallow. It can also increase the risk of:  Heart attack.  Stroke.  Being very overweight (obese).  Diabetes.  Heart failure.  Irregular heartbeat. The goal of treatment is to help you breathe normally again. What are the causes? There are three kinds of sleep apnea:  Obstructive sleep apnea. This is caused by a blocked or collapsed airway.  Central sleep apnea. This happens when the brain does not send the right signals to the muscles that control breathing.  Mixed sleep apnea. This is a combination of obstructive and central sleep apnea. The most common cause of this condition is a collapsed or blocked airway. This can happen if:  Your throat muscles are too relaxed.  Your tongue and tonsils are too large.  You are overweight.  Your airway is too small. What increases the  risk?  Being overweight.  Smoking.  Having a small airway.  Being older.  Being female.  Drinking alcohol.  Taking medicines to calm yourself (sedatives or tranquilizers).  Having family members with the condition. What are the signs or symptoms?  Trouble staying asleep.  Being sleepy or tired during the day.  Getting angry a lot.  Loud snoring.  Headaches in the morning.  Not being able to focus your mind (concentrate).  Forgetting things.  Less interest in sex.  Mood swings.  Personality changes.  Feelings of sadness (depression).  Waking up a lot during the night to pee (urinate).  Dry mouth.  Sore throat. How is this diagnosed?  Your medical history.  A physical exam.  A test that is done when you are sleeping (sleep study). The test is most often done in a sleep lab but may also be done at home. How is this treated?   Sleeping on your side.  Using a medicine to get rid of mucus in your nose (decongestant).  Avoiding the use of alcohol, medicines to help you relax, or certain pain medicines (narcotics).  Losing weight, if needed.  Changing your diet.  Not smoking.  Using a machine to open your airway while you sleep, such as: ? An oral appliance. This is a mouthpiece that shifts your lower jaw forward. ? A CPAP device. This device blows air through a mask when you breathe out (exhale). ? An EPAP device. This has valves that you put in each nostril. ? A BPAP device. This device blows air through a mask when you breathe in (inhale) and breathe out.  Having surgery if other treatments do not work. It is important to get treatment for sleep apnea. Without treatment, it can lead to:  High blood pressure.  Coronary artery disease.  In men, not being able to have an erection (impotence).  Reduced thinking ability. Follow these instructions at home: Lifestyle  Make changes  that your doctor recommends.  Eat a healthy diet.  Lose  weight if needed.  Avoid alcohol, medicines to help you relax, and some pain medicines.  Do not use any products that contain nicotine or tobacco, such as cigarettes, e-cigarettes, and chewing tobacco. If you need help quitting, ask your doctor. General instructions  Take over-the-counter and prescription medicines only as told by your doctor.  If you were given a machine to use while you sleep, use it only as told by your doctor.  If you are having surgery, make sure to tell your doctor you have sleep apnea. You may need to bring your device with you.  Keep all follow-up visits as told by your doctor. This is important. Contact a doctor if:  The machine that you were given to use during sleep bothers you or does not seem to be working.  You do not get better.  You get worse. Get help right away if:  Your chest hurts.  You have trouble breathing in enough air.  You have an uncomfortable feeling in your back, arms, or stomach.  You have trouble talking.  One side of your body feels weak.  A part of your face is hanging down. These symptoms may be an emergency. Do not wait to see if the symptoms will go away. Get medical help right away. Call your local emergency services (911 in the [place] Do not drive yourself to the hospital. Summary  This condition affects breathing during sleep.  The most common cause is a collapsed or blocked airway.  The goal of treatment is to help you breathe normally while you sleep. This information is not intended to replace advice given to you by your health care provider. Make sure you discuss any questions you have with your health care provider. Document Released: 05/24/2008 Document Revised: 06/01/2018 Document Reviewed: 04/10/2018 Elsevier Patient Education  2020 [employer].

## 2019-06-19 NOTE — Progress Notes (Signed)
PATIENT: Ashley Harrison DOB: October 14, 1970  REASON FOR VISIT: follow up HISTORY FROM: patient  Chief Complaint  Patient presents with  . Follow-up    corner room, alone. Doesnt care for the CPAP. Doesnt feel well rested.     HISTORY OF PRESENT ILLNESS: Today 06/20/19 Ashley Harrison is a 48 y.o. female here today for follow up for initial CPAP download following start of CPAP. Her CPAP has an expired modem. We have been unable to obtain compliance report. She can take CPAP to [facility] (per Jeneen Rinks) to switch out motor. Per Dr Dohmeier's report on sleep study she had  "Severe sleep apnea with an AHI of 57.4 /h, with minor accentuation during REM sleep to AHI 63/h. 38 minutes of the recorded time were in hypoxia. 25% of all apneas were central. A loud level of snoring was noted. "  Compliance report dated 03/20/2019 through 04/18/2019 revealed that she use CPAP 10 of those 30 days for compliance of 33%.  She did not use CPAP for greater than 4 hours.  Average usage was 1 hour and 28 minutes.  Average AHI was 19.4 on auto titrating pressure of 6 to 18 cm of water.  There was a large leak noted.    HISTORY: (copied from Dr Dohmeier's note on 02/13/2019)  HPI:  Ashley Harrison is a 48 y.o. female , seen here as in a referral from Dr. Baird Cancer for a sleep evaluation.  The patient carries a diagnosis of iron deficiency anemia, heavy menstrual periods, HTN and obesity. She has 2 children and delivered the second child by c- section.    Chief complaint according to patient : my sleep is not refreshing.   Sleep and medical history: the patient has never had a sleep test before. In childhood and youth she had no  trouble sleeping, it began in her 42s. She felt not nearly as fatigued when her sons were young.  BMI is 55.5 - she gained weight in peri-menopause. HTN developed after first pregnancy.  Family sleep and medical history: no OSA known, no insomnia, no sleep walking. HTN in mother, who passed  at age 32 of a stroke, CAD, DM , CKD, ESRD - on dialysis.   Social history: sons are 31 and 49. Working full time outside the home. Household size is 3 - her and her sons.  No pets. She works 8-5, in a place without day light.  She is divorced,  non smoker, ETOH use - none,  caffeine- in form of coffee in AM ( 1 cup) and soda after lunch ( 4 times a week) .    Sleep habits are as follows: Dinner time is 6-7.30 PM, her bedtime is 10-11 PM, and she describes her bedroom as warm, quiet but not dark she keeps the TV on.  She has 2 bathroom breaks most nights, sleeps on her side and with 3 pillows. She has woken up with hot flashes , and a racing heart.  She dreams infrequently.  She wakes usually at 6 AM, spontaneously with an alarm as a back up.   Her feet swell over night, so she elevates her feet. She has no headaches , no dry mouth. Her sons report she snores.   TST 6 hours average. No daytime naps.     REVIEW OF SYSTEMS: Out of a complete 14 system review of symptoms, the patient complains only of the following symptoms, none and all other reviewed systems are negative.   ALLERGIES:  Allergies  Allergen Reactions  . Shellfish Allergy Hives    HOME MEDICATIONS: Outpatient Medications Prior to Visit  Medication Sig Dispense Refill  . telmisartan-hydrochlorothiazide (MICARDIS HCT) 40-12.5 MG tablet Take 1 tablet by mouth once daily 30 tablet 0  . hydrochlorothiazide (HYDRODIURIL) 12.5 MG tablet Take 12.5 mg by mouth daily.    . Magnesium 400 MG CAPS Take 400 mg by mouth daily. 90 capsule 1  . potassium chloride SA (K-DUR) 20 MEQ tablet Take 2 tablets (40 mEq total) by mouth daily. (Patient not taking: Reported on 04/17/2019) 5 tablet 0   No facility-administered medications prior to visit.     PAST MEDICAL HISTORY: Past Medical History:  Diagnosis Date  . Hypertension   . Iron deficiency anemia due to chronic blood loss 10/31/2017  . Iron deficiency anemia, unspecified   .  Menometrorrhagia 10/31/2017  . Obesity     PAST SURGICAL HISTORY: Past Surgical History:  Procedure Laterality Date  . CESAREAN SECTION  01/2001, 05/2004    x 2  . HYSTEROSCOPY WITH NOVASURE N/A 02/06/2014   Procedure: HYSTEROSCOPY WITH NOVASURE;  Surgeon: Oliver Pila, MD;  Location: [facility];  Service: Gynecology;  Laterality: N/A;  1hr OR time  . TUBAL LIGATION  2005  . WISDOM TOOTH EXTRACTION  1988    FAMILY HISTORY: Family History  Problem Relation Age of Onset  . Diabetes Mother   . Hyperlipidemia Father   . Hypertension Father     SOCIAL HISTORY: Social History   Socioeconomic History  . Marital status: Divorced    Spouse name: Not on file  . Number of children: Not on file  . Years of education: Not on file  . Highest education level: Not on file  Occupational History  . Not on file  Social Needs  . Financial resource strain: Not on file  . Food insecurity    Worry: Not on file    Inability: Not on file  . Transportation needs    Medical: Not on file    Non-medical: Not on file  Tobacco Use  . Smoking status: Never Smoker  . Smokeless tobacco: Never Used  Substance and Sexual Activity  . Alcohol use: No  . Drug use: No  . Sexual activity: Not Currently    Birth control/protection: Surgical  Lifestyle  . Physical activity    Days per week: Not on file    Minutes per session: Not on file  . Stress: Not on file  Relationships  . Social Musician on phone: Not on file    Gets together: Not on file    Attends religious service: Not on file    Active member of club or organization: Not on file    Attends meetings of clubs or organizations: Not on file    Relationship status: Not on file  . Intimate partner violence    Fear of current or ex partner: Not on file    Emotionally abused: Not on file    Physically abused: Not on file    Forced sexual activity: Not on file  Other Topics Concern  . Not on file  Social History Narrative  .  Not on file      PHYSICAL EXAM  Vitals:   06/20/19 0800 06/20/19 0805  BP: (!) 169/107 (!) 160/90  Pulse: 82   Temp: 97.8 F (36.6 C)   Weight: (!) 300 lb 3.2 oz (136.2 kg)   Height: 5\' 2"  (1.575 m)  Body mass index is 54.91 kg/m.  Generalized: Well developed, in no acute distress  Cardiology: normal rate and rhythm, no murmur noted Neurological examination  Mentation: Alert oriented to time, place, history taking. Follows all commands speech and language fluent Cranial nerve II-XII: Pupils were equal round reactive to light. Extraocular movements were full, visual field were full on confrontational test. Facial sensation and strength were normal. Uvula tongue midline. Head turning and shoulder shrug  were normal and symmetric. Motor: The motor testing reveals 5 over 5 strength of all 4 extremities. Good symmetric motor tone is noted throughout.  Gait and station: Gait is normal.   DIAGNOSTIC DATA (LABS, IMAGING, TESTING) - I reviewed patient records, labs, notes, testing and imaging myself where available.  No flowsheet data found.   Lab Results  Component Value Date   WBC 7.8 04/04/2019   HGB 11.5 (L) 04/04/2019   HCT 36.6 04/04/2019   MCV 85.9 04/04/2019   PLT 306 04/04/2019      Component Value Date/Time   NA 140 04/03/2019 1648   K 4.2 04/03/2019 1648   CL 103 04/03/2019 1648   CO2 22 04/03/2019 1648   GLUCOSE 108 (H) 04/03/2019 1648   GLUCOSE 123 (H) 03/30/2019 1344   BUN 9 04/03/2019 1648   CREATININE 0.71 04/03/2019 1648   CREATININE 0.60 02/26/2018 1505   CALCIUM 9.2 04/03/2019 1648   PROT 7.4 02/26/2018 1505   ALBUMIN 3.5 02/26/2018 1505   AST 17 02/26/2018 1505   ALT 23 02/26/2018 1505   ALKPHOS 89 (H) 02/26/2018 1505   BILITOT 0.8 02/26/2018 1505   GFRNONAA 100 04/03/2019 1648   GFRNONAA >60 10/31/2017 0803   GFRAA 116 04/03/2019 1648   GFRAA >60 10/31/2017 0803   No results found for: CHOL, HDL, LDLCALC, LDLDIRECT, TRIG, CHOLHDL Lab  Results  Component Value Date   HGBA1C 5.7 (H) 10/09/2018   No results found for: VITAMINB12 Lab Results  Component Value Date   TSH 0.479 03/30/2019     ASSESSMENT AND PLAN 48 y.o. year old female  has a past medical history of Hypertension, Iron deficiency anemia due to chronic blood loss (10/31/2017), Iron deficiency anemia, unspecified, Menometrorrhagia (10/31/2017), and Obesity. here with     ICD-10-CM   1. OSA on CPAP  G47.33    Z99.89     .  Has had some difficulty getting adjusted to CPAP therapy.  She has had trouble finding a mask that works for her.  She is uncertain that CPAP therapy is something that she wants to continue.  After discussing risk of untreated sleep apnea, she is willing to continue use for now.  I did she set small goals for herself with ultimate goal of nightly use in greater than 4 hours each night.  She will continue working with DME company for an appropriate mask.  She was also advised to reach out to [facility], who will assist her in changing out the modem.  She will follow-up with me in 8 weeks.  Virtual visit has been scheduled.  She verbalizes understanding and agreement with this plan.   No orders of the defined types were placed in this encounter.    No orders of the defined types were placed in this encounter.     I spent 15 minutes with the patient. 50% of this time was spent counseling and educating patient on plan of care and medications.    Shawnie Dappermy Delaynee Alred, FNP-C 06/20/2019, 4:16 PM [facility] [address],  [place], [place] [address] [phone]

## 2019-06-20 ENCOUNTER — Ambulatory Visit: Payer: 59 | Admitting: Family Medicine

## 2019-06-20 ENCOUNTER — Encounter: Payer: Self-pay | Admitting: Family Medicine

## 2019-06-20 ENCOUNTER — Other Ambulatory Visit: Payer: Self-pay

## 2019-06-20 VITALS — BP 160/90 | HR 82 | Temp 97.8°F | Ht 62.0 in | Wt 300.2 lb

## 2019-06-20 DIAGNOSIS — Z9989 Dependence on other enabling machines and devices: Secondary | ICD-10-CM | POA: Diagnosis not present

## 2019-06-20 DIAGNOSIS — G4733 Obstructive sleep apnea (adult) (pediatric): Secondary | ICD-10-CM | POA: Diagnosis not present

## 2019-07-09 ENCOUNTER — Other Ambulatory Visit: Payer: Self-pay

## 2019-07-09 ENCOUNTER — Ambulatory Visit: Payer: 59 | Admitting: Nurse Practitioner

## 2019-07-09 ENCOUNTER — Encounter: Payer: Self-pay | Admitting: Nurse Practitioner

## 2019-07-09 VITALS — BP 150/100 | HR 96 | Temp 98.4°F | Ht 62.6 in | Wt 303.0 lb

## 2019-07-09 DIAGNOSIS — I10 Essential (primary) hypertension: Secondary | ICD-10-CM

## 2019-07-09 DIAGNOSIS — D509 Iron deficiency anemia, unspecified: Secondary | ICD-10-CM | POA: Diagnosis not present

## 2019-07-09 DIAGNOSIS — E559 Vitamin D deficiency, unspecified: Secondary | ICD-10-CM

## 2019-07-09 DIAGNOSIS — G4733 Obstructive sleep apnea (adult) (pediatric): Secondary | ICD-10-CM | POA: Diagnosis not present

## 2019-07-09 DIAGNOSIS — Z6841 Body Mass Index (BMI) 40.0 and over, adult: Secondary | ICD-10-CM

## 2019-07-09 DIAGNOSIS — Z0001 Encounter for general adult medical examination with abnormal findings: Secondary | ICD-10-CM | POA: Diagnosis not present

## 2019-07-09 DIAGNOSIS — Z1211 Encounter for screening for malignant neoplasm of colon: Secondary | ICD-10-CM

## 2019-07-09 DIAGNOSIS — R7309 Other abnormal glucose: Secondary | ICD-10-CM

## 2019-07-09 DIAGNOSIS — Z0000 Encounter for general adult medical examination without abnormal findings: Secondary | ICD-10-CM

## 2019-07-09 LAB — POCT URINALYSIS DIPSTICK
Bilirubin, UA: NEGATIVE
Blood, UA: NEGATIVE
Glucose, UA: NEGATIVE
Ketones, UA: NEGATIVE
Leukocytes, UA: NEGATIVE
Nitrite, UA: NEGATIVE
Protein, UA: NEGATIVE
Spec Grav, UA: 1.025 (ref 1.010–1.025)
Urobilinogen, UA: 0.2 E.U./dL
pH, UA: 6 (ref 5.0–8.0)

## 2019-07-09 LAB — POCT UA - MICROALBUMIN
Albumin/Creatinine Ratio, Urine, POC: 30
Creatinine, POC: 100 mg/dL
Microalbumin Ur, POC: 10 mg/L

## 2019-07-09 MED ORDER — TELMISARTAN-HCTZ 40-12.5 MG PO TABS: 1.0000 | ORAL_TABLET | Freq: Every day | ORAL | 0 refills | Status: DC

## 2019-07-09 NOTE — Progress Notes (Signed)
Subjective:     Patient ID: Ashley Harrison , female    DOB: October 20, 1970 , 48 y.o.   MRN: [id number]   Chief Complaint  Patient presents with  . Annual Exam    HPI  Here for HM  She is using her CPAP started in august - not taking magnesium nightly  Drinking 2-3 bottles of water daily  Hypertension This is a chronic problem. The current episode started more than 1 year ago. The problem has been gradually worsening since onset. The problem is uncontrolled. Pertinent negatives include no anxiety, blurred vision, headaches or peripheral edema. There are no associated agents to hypertension. Risk factors for coronary artery disease include obesity and sedentary lifestyle. Past treatments include angiotensin blockers and diuretics. There are no compliance problems.  There is no history of angina or kidney disease.    The patient states she uses ablation for birth control.  No LMP recorded. Patient has had an ablation.. Negative for Dysmenorrhea and Negative for Menorrhagia Mammogram last done 2020.  Negative for: breast discharge, breast lump(s), breast pain and breast self exam.  Pertinent negatives include abnormal bleeding (hematology), anxiety, decreased libido, depression, difficulty falling sleep, dyspareunia, history of infertility, nocturia, sexual dysfunction, sleep disturbances, urinary incontinence, urinary urgency, vaginal discharge and vaginal itching. Diet regular. The patient states her exercise level is minimal.  She is working at [employer] going into the office.      The patient's tobacco use is:  Social History   Tobacco Use  Smoking Status Never Smoker  Smokeless Tobacco Never Used   She has been exposed to passive smoke. The patient's alcohol use is:  Social History   Substance and Sexual Activity  Alcohol Use No    Past Medical History:  Diagnosis Date  . Hypertension   . Iron deficiency anemia due to chronic blood loss 10/31/2017  . Iron deficiency anemia,  unspecified   . Menometrorrhagia 10/31/2017  . Obesity      Family History  Problem Relation Age of Onset  . Diabetes Mother   . Hyperlipidemia Father   . Hypertension Father      Current Outpatient Medications:  .  telmisartan-hydrochlorothiazide (MICARDIS HCT) 40-12.5 MG tablet, Take 1 tablet by mouth once daily, Disp: 30 tablet, Rfl: 0   Allergies  Allergen Reactions  . Shellfish Allergy Hives     Review of Systems  Constitutional: Negative.   HENT: Negative.   Eyes: Negative.  Negative for blurred vision.  Respiratory: Negative.   Cardiovascular: Negative.   Gastrointestinal: Negative.   Endocrine: Negative.   Genitourinary: Negative.   Musculoskeletal: Negative.   Skin: Negative.   Allergic/Immunologic: Negative.   Neurological: Negative.  Negative for dizziness and headaches.  Hematological: Negative.   Psychiatric/Behavioral: Negative.      Today's Vitals   07/09/19 1456  BP: (!) 150/100  Pulse: 96  Temp: 98.4 F (36.9 C)  TempSrc: Oral  Weight: (!) 303 lb (137.4 kg)  Height: 5' 2.6" (1.59 m)  PainSc: 2   PainLoc: Back   Body mass index is 54.36 kg/m.   Objective:  Physical Exam Vitals signs reviewed.  Constitutional:      Appearance: Normal appearance.  HENT:     Head: Normocephalic and atraumatic.  Eyes:     Extraocular Movements: Extraocular movements intact.     Conjunctiva/sclera: Conjunctivae normal.     Pupils: Pupils are equal, round, and reactive to light.  Neck:     Musculoskeletal: Normal range of motion and  neck supple.  Cardiovascular:     Rate and Rhythm: Normal rate and regular rhythm.     Pulses: Normal pulses.     Heart sounds: Normal heart sounds. No murmur.  Pulmonary:     Effort: Pulmonary effort is normal. No respiratory distress.     Breath sounds: Normal breath sounds.  Abdominal:     General: Abdomen is flat. Bowel sounds are normal.     Palpations: Abdomen is soft.  Skin:    General: Skin is warm and dry.      Capillary Refill: Capillary refill takes less than 2 seconds.  Neurological:     General: No focal deficit present.     Mental Status: She is alert and oriented to person, place, and time.  Psychiatric:        Mood and Affect: Mood normal.        Behavior: Behavior normal.        Thought Content: Thought content normal.        Judgment: Judgment normal.         Assessment And Plan:     1. Health maintenance examination . Behavior modifications discussed and diet history reviewed.   . Pt will continue to exercise regularly and modify diet with low GI, plant based foods and decrease intake of processed foods.  . Recommend intake of daily multivitamin, Vitamin D, and calcium.  . Recommend mammogram and colonoscopy for preventive screenings, as well as recommend immunizations that include influenza, TDAP (up to date)  2. Essential hypertension, benign . B/P is poorly controlled.  . CMP ordered to check renal function.  . The importance of regular exercise and dietary modification was stressed to the patient.  . Stressed importance of losing ten percent of her body weight to help with B/P control.  . The weight loss would help with decreasing cardiac and cancer risk as well.  . EKG shows NSR - POCT Urinalysis Dipstick (81002) - POCT UA - Microalbumin - EKG 12-Lead  3. Obstructive sleep apnea  Chronic, reports using her CPAP regularly  4. Class 3 severe obesity due to excess calories with serious comorbidity and body mass index (BMI) of 50.0 to 59.9 in adult [facility])  Chronic  Discussed healthy diet and regular exercise options   Encouraged to exercise at least 150 minutes per week with 2 days of strength training  5. Iron deficiency anemia, unspecified iron deficiency anemia type  Chronic, she had normal level iron studies in August  6. Other abnormal glucose  Chronic, stable  no current medications  Encouraged to limit intake of sugary foods and drinks  Encouraged to  increase physical activity to 150 minutes per week    Arnette Felts, FNP    THE PATIENT IS ENCOURAGED TO PRACTICE SOCIAL DISTANCING DUE TO THE COVID-19 PANDEMIC.

## 2019-07-09 NOTE — Patient Instructions (Signed)
Health Maintenance  Topic Date Due  . HIV Screening  10/10/2019 (Originally 08/20/1984)  . INFLUENZA VACCINE  07/19/2021 (Originally 03/30/2019)  . TETANUS/TDAP  08/05/2019  . PAP SMEAR-Modifier  10/03/2021   Health Maintenance, Female Adopting a healthy lifestyle and getting preventive care are important in promoting health and wellness. Ask your health care provider about:  The right schedule for you to have regular tests and exams.  Things you can do on your own to prevent diseases and keep yourself healthy. What should I know about diet, weight, and exercise? Eat a healthy diet   Eat a diet that includes plenty of vegetables, fruits, low-fat dairy products, and lean protein.  Do not eat a lot of foods that are high in solid fats, added sugars, or sodium. Maintain a healthy weight Body mass index (BMI) is used to identify weight problems. It estimates body fat based on height and weight. Your health care provider can help determine your BMI and help you achieve or maintain a healthy weight. Get regular exercise Get regular exercise. This is one of the most important things you can do for your health. Most adults should:  Exercise for at least 150 minutes each week. The exercise should increase your heart rate and make you sweat (moderate-intensity exercise).  Do strengthening exercises at least twice a week. This is in addition to the moderate-intensity exercise.  Spend less time sitting. Even light physical activity can be beneficial. Watch cholesterol and blood lipids Have your blood tested for lipids and cholesterol at 50 years of age, then have this test every 5 years. Have your cholesterol levels checked more often if:  Your lipid or cholesterol levels are high.  You are older than 50 years of age.  You are at high risk for heart disease. What should I know about cancer screening? Depending on your health history and family history, you may need to have cancer screening  at various ages. This may include screening for:  Breast cancer.  Cervical cancer.  Colorectal cancer.  Skin cancer.  Lung cancer. What should I know about heart disease, diabetes, and high blood pressure? Blood pressure and heart disease  High blood pressure causes heart disease and increases the risk of stroke. This is more likely to develop in people who have high blood pressure readings, are of African descent, or are overweight.  Have your blood pressure checked: ? Every 3-5 years if you are 42-21 years of age. ? Every year if you are 72 years old or older. Diabetes Have regular diabetes screenings. This checks your fasting blood sugar level. Have the screening done:  Once every three years after age 42 if you are at a normal weight and have a low risk for diabetes.  More often and at a younger age if you are overweight or have a high risk for diabetes. What should I know about preventing infection? Hepatitis B If you have a higher risk for hepatitis B, you should be screened for this virus. Talk with your health care provider to find out if you are at risk for hepatitis B infection. Hepatitis C Testing is recommended for:  Everyone born from [birth year] through 1967.  Anyone with known risk factors for hepatitis C. Sexually transmitted infections (STIs)  Get screened for STIs, including gonorrhea and chlamydia, if: ? You are sexually active and are younger than 50 years of age. ? You are older than 50 years of age and your health care provider tells you that you  are at risk for this type of infection. ? Your sexual activity has changed since you were last screened, and you are at increased risk for chlamydia or gonorrhea. Ask your health care provider if you are at risk.  Ask your health care provider about whether you are at high risk for HIV. Your health care provider may recommend a prescription medicine to help prevent HIV infection. If you choose to take medicine to  prevent HIV, you should first get tested for HIV. You should then be tested every 3 months for as long as you are taking the medicine. Pregnancy  If you are about to stop having your period (premenopausal) and you may become pregnant, seek counseling before you get pregnant.  Take 400 to 800 micrograms (mcg) of folic acid every day if you become pregnant.  Ask for birth control (contraception) if you want to prevent pregnancy. Osteoporosis and menopause Osteoporosis is a disease in which the bones lose minerals and strength with aging. This can result in bone fractures. If you are 7 years old or older, or if you are at risk for osteoporosis and fractures, ask your health care provider if you should:  Be screened for bone loss.  Take a calcium or vitamin D supplement to lower your risk of fractures.  Be given hormone replacement therapy (HRT) to treat symptoms of menopause. Follow these instructions at home: Lifestyle  Do not use any products that contain nicotine or tobacco, such as cigarettes, e-cigarettes, and chewing tobacco. If you need help quitting, ask your health care provider.  Do not use street drugs.  Do not share needles.  Ask your health care provider for help if you need support or information about quitting drugs. Alcohol use  Do not drink alcohol if: ? Your health care provider tells you not to drink. ? You are pregnant, may be pregnant, or are planning to become pregnant.  If you drink alcohol: ? Limit how much you use to 0-1 drink a day. ? Limit intake if you are breastfeeding.  Be aware of how much alcohol is in your drink. In the [place] one drink equals one 12 oz bottle of beer (355 mL), one 5 oz glass of wine (148 mL), or one 1 oz glass of hard liquor (44 mL). General instructions  Schedule regular health, dental, and eye exams.  Stay current with your vaccines.  Tell your health care provider if: ? You often feel depressed. ? You have ever been  abused or do not feel safe at home. Summary  Adopting a healthy lifestyle and getting preventive care are important in promoting health and wellness.  Follow your health care provider's instructions about healthy diet, exercising, and getting tested or screened for diseases.  Follow your health care provider's instructions on monitoring your cholesterol and blood pressure. This information is not intended to replace advice given to you by your health care provider. Make sure you discuss any questions you have with your health care provider. Document Released: 02/28/2011 Document Revised: 08/08/2018 Document Reviewed: 08/08/2018 Elsevier Patient Education  2020 [employer].  Take 400mg  of Magnesium daily in the evening Check your blood pressure daily for the next week and send [place] the readings.

## 2019-07-10 LAB — CMP14+EGFR
ALT: 23 IU/L (ref 0–32)
AST: 18 IU/L (ref 0–40)
Albumin/Globulin Ratio: 1.8 (ref 1.2–2.2)
Albumin: 4.2 g/dL (ref 3.8–4.8)
Alkaline Phosphatase: 133 IU/L — ABNORMAL HIGH (ref 39–117)
BUN/Creatinine Ratio: 11 (ref 9–23)
BUN: 7 mg/dL (ref 6–24)
Bilirubin Total: 0.5 mg/dL (ref 0.0–1.2)
CO2: 26 mmol/L (ref 20–29)
Calcium: 9.7 mg/dL (ref 8.7–10.2)
Chloride: 98 mmol/L (ref 96–106)
Creatinine, Ser: 0.63 mg/dL (ref 0.57–1.00)
GFR calc Af Amer: 122 mL/min/{1.73_m2} (ref >59)
GFR calc non Af Amer: 106 mL/min/{1.73_m2} (ref >59)
Globulin, Total: 2.4 g/dL (ref 1.5–4.5)
Glucose: 84 mg/dL (ref 65–99)
Potassium: 3.9 mmol/L (ref 3.5–5.2)
Sodium: 140 mmol/L (ref 134–144)
Total Protein: 6.6 g/dL (ref 6.0–8.5)

## 2019-07-10 LAB — CBC WITH DIFFERENTIAL/PLATELET
Basophils Absolute: 0 x10E3/uL (ref 0.0–0.2)
Basos: 0 %
EOS (ABSOLUTE): 0.1 x10E3/uL (ref 0.0–0.4)
Eos: 2 %
Hematocrit: 40.2 % (ref 34.0–46.6)
Hemoglobin: 12.8 g/dL (ref 11.1–15.9)
Immature Grans (Abs): 0 x10E3/uL (ref 0.0–0.1)
Immature Granulocytes: 0 %
Lymphocytes Absolute: 2.5 x10E3/uL (ref 0.7–3.1)
Lymphs: 30 %
MCH: 26.2 pg — ABNORMAL LOW (ref 26.6–33.0)
MCHC: 31.8 g/dL (ref 31.5–35.7)
MCV: 82 fL (ref 79–97)
Monocytes Absolute: 0.4 x10E3/uL (ref 0.1–0.9)
Monocytes: 5 %
Neutrophils Absolute: 5.2 x10E3/uL (ref 1.4–7.0)
Neutrophils: 63 %
Platelets: 302 x10E3/uL (ref 150–450)
RBC: 4.89 x10E6/uL (ref 3.77–5.28)
RDW: 14 % (ref 11.7–15.4)
WBC: 8.3 x10E3/uL (ref 3.4–10.8)

## 2019-07-10 LAB — LIPID PANEL
Chol/HDL Ratio: 2.8 ratio (ref 0.0–4.4)
Cholesterol, Total: 234 mg/dL — ABNORMAL HIGH (ref 100–199)
HDL: 85 mg/dL (ref >39)
LDL Chol Calc (NIH): 139 mg/dL — ABNORMAL HIGH (ref 0–99)
Triglycerides: 58 mg/dL (ref 0–149)
VLDL Cholesterol Cal: 10 mg/dL (ref 5–40)

## 2019-07-10 LAB — VITAMIN D 25 HYDROXY (VIT D DEFICIENCY, FRACTURES): Vit D, 25-Hydroxy: 15.4 ng/mL — ABNORMAL LOW (ref 30.0–100.0)

## 2019-07-10 LAB — HEMOGLOBIN A1C
Est. average glucose Bld gHb Est-mCnc: 117 mg/dL
Hgb A1c MFr Bld: 5.7 % — ABNORMAL HIGH (ref 4.8–5.6)

## 2019-07-10 LAB — TSH: TSH: 0.745 u[IU]/mL (ref 0.450–4.500)

## 2019-08-05 ENCOUNTER — Other Ambulatory Visit: Payer: 59

## 2019-08-05 ENCOUNTER — Ambulatory Visit: Payer: 59 | Admitting: Family

## 2019-08-14 ENCOUNTER — Other Ambulatory Visit: Payer: Self-pay

## 2019-08-14 ENCOUNTER — Ambulatory Visit: Payer: 59 | Admitting: Internal Medicine

## 2019-08-14 ENCOUNTER — Encounter: Payer: Self-pay | Admitting: Internal Medicine

## 2019-08-14 VITALS — BP 146/102 | HR 84 | Temp 97.6°F | Ht 62.6 in | Wt 300.4 lb

## 2019-08-14 DIAGNOSIS — G4489 Other headache syndrome: Secondary | ICD-10-CM | POA: Diagnosis not present

## 2019-08-14 DIAGNOSIS — G4733 Obstructive sleep apnea (adult) (pediatric): Secondary | ICD-10-CM

## 2019-08-14 DIAGNOSIS — I10 Essential (primary) hypertension: Secondary | ICD-10-CM

## 2019-08-14 DIAGNOSIS — Z6841 Body Mass Index (BMI) 40.0 and over, adult: Secondary | ICD-10-CM

## 2019-08-14 MED ORDER — TELMISARTAN-HCTZ 80-25 MG PO TABS: 1.0000 | ORAL_TABLET | Freq: Every day | ORAL | 1 refills | Status: DC

## 2019-08-14 NOTE — Progress Notes (Signed)
This visit occurred during the SARS-CoV-2 public health emergency.  Safety protocols were in place, including screening questions prior to the visit, additional usage of staff PPE, and extensive cleaning of exam room while observing appropriate contact time as indicated for disinfecting solutions.  Subjective:     Patient ID: Ashley Harrison , female    DOB: 12-18-1970 , 48 y.o.   MRN: [id number]   Chief Complaint  Patient presents with  . Hypertension  . Headache    HPI  Hypertension This is a chronic problem. The current episode started more than 1 year ago. The problem has been gradually worsening since onset. The problem is uncontrolled. Associated symptoms include headaches. Pertinent negatives include no anxiety, blurred vision or peripheral edema. There are no associated agents to hypertension. Risk factors for coronary artery disease include obesity and sedentary lifestyle. Past treatments include angiotensin blockers and diuretics. There are no compliance problems.  There is no history of angina or kidney disease.  Headache  This is a recurrent problem. The current episode started 1 to 4 weeks ago. The problem occurs intermittently. The problem has been unchanged. The pain is located in the bilateral and frontal region. The pain does not radiate. The quality of the pain is described as dull and throbbing. Pertinent negatives include no blurred vision, numbness or phonophobia. She has tried acetaminophen for the symptoms. The treatment provided moderate relief.     Past Medical History:  Diagnosis Date  . Hypertension   . Iron deficiency anemia due to chronic blood loss 10/31/2017  . Iron deficiency anemia, unspecified   . Menometrorrhagia 10/31/2017  . Obesity      Family History  Problem Relation Age of Onset  . Diabetes Mother   . Hyperlipidemia Father   . Hypertension Father      Current Outpatient Medications:  .  telmisartan-hydrochlorothiazide (MICARDIS HCT) 80-25  MG tablet, Take 1 tablet by mouth daily., Disp: 30 tablet, Rfl: 1   Allergies  Allergen Reactions  . Shellfish Allergy Hives     Review of Systems  Constitutional: Negative.   Eyes: Negative for blurred vision.  Respiratory: Negative.   Cardiovascular: Negative.   Gastrointestinal: Negative.   Neurological: Positive for headaches. Negative for numbness.  Psychiatric/Behavioral: Negative.      Today's Vitals   08/14/19 0856  BP: (!) 146/102  Pulse: 84  Temp: 97.6 F (36.4 C)  TempSrc: Oral  Weight: (!) 300 lb 6.4 oz (136.3 kg)  Height: 5' 2.6" (1.59 m)  PainSc: 0-No pain   Body mass index is 53.9 kg/m.   Objective:  Physical Exam Vitals and nursing note reviewed.  Constitutional:      Appearance: Normal appearance. She is well-developed. She is obese.  HENT:     Head: Normocephalic and atraumatic.  Cardiovascular:     Rate and Rhythm: Normal rate and regular rhythm.     Heart sounds: Normal heart sounds.  Pulmonary:     Effort: Pulmonary effort is normal.     Breath sounds: Normal breath sounds.  Skin:    General: Skin is warm.  Neurological:     General: No focal deficit present.     Mental Status: She is alert.  Psychiatric:        Mood and Affect: Mood normal.        Behavior: Behavior normal.         Assessment And Plan:     1. Essential hypertension, benign  Chronic, uncontrolled. I will increase her  Micardis/Hct to 80/25mg  daily. She will rto in 3-4 weeks for re-evaluation. I will check a BMP at that time.   2. Other headache syndrome  Intermittent. Likely related to her elevated blood pressure. This should resolve as her bp improves. She is also encouraged to start magnesium 400mg  nightly. She may get this over the counter.   3. Obstructive sleep apnea  Chronic, importance of CPAP compliance was discussed with the patient. She is encouraged to wear at least four hours per night.   4. Class 3 severe obesity due to excess calories with serious  comorbidity and body mass index (BMI) of 50.0 to 59.9 in adult [facility])  Importance of achieving optimal weight to decrease risk of cardiovascular disease and cancers was discussed with the patient in full detail. She is encouraged to start slowly - start with 10 minutes twice daily at least three to four days per week and to gradually build to 30 minutes five days weekly. She was given tips to incorporate more activity into her daily routine - take stairs when possible, park farther away from her job, grocery stores, etc.     [name], MD    THE PATIENT IS ENCOURAGED TO PRACTICE SOCIAL DISTANCING DUE TO THE COVID-19 PANDEMIC.

## 2019-08-14 NOTE — Patient Instructions (Signed)

## 2019-08-19 NOTE — Progress Notes (Signed)
PATIENT: Ashley Harrison DOB: 24-Jan-1971  REASON FOR VISIT: follow up HISTORY FROM: patient  Virtual Visit via Telephone Note  I connected with Ashley Harrison on 08/20/19 at  7:30 AM EST by telephone and verified that I am speaking with the correct person using two identifiers.   I discussed the limitations, risks, security and privacy concerns of performing an evaluation and management service by telephone and the availability of in person appointments. I also discussed with the patient that there may be a patient responsible charge related to this service. The patient expressed understanding and agreed to proceed.   History of Present Illness:  08/20/19 Ashley Harrison is a 48 y.o. female here today for follow up for OSA on CPAP. She continues to work on compliance. She has been more consistent with use but struggles to get four hours every night. She feels that sinus congestion is limiting her ability to comply. Otherwise she is doing ok.   Compliance report dated 07/20/2019 through 08/17/2020 he has used CPAP 16 of the last 30 days for compliance of 53%.  She used CPAP greater than 4 hours 36.7% of the time.  Average usage was 5 hours and 3 minutes on days used.  Residual AHI was 16 on 6 to 18 cm of water.  There was a fairly large leak noted.  History (copied from my note on 06/20/2019)  [name] is a 48 y.o. female here today for follow up for initial CPAP download following start of CPAP. Her CPAP has an expired modem. We have been unable to obtain compliance report. She can take CPAP to [facility] (per Fayrene Fearing) to switch out motor. Per Dr Dohmeier's report on sleep study she had  "Severe sleep apnea with an AHI of 57.4 /h, with minor accentuation during REM sleep to AHI 63/h. 38 minutes of the recorded time were in hypoxia. 25% of all apneas were central. A loud level of snoring was noted. "  Compliance report dated 03/20/2019 through 04/18/2019 revealed that she use CPAP 10 of  those 30 days for compliance of 33%.  She did not use CPAP for greater than 4 hours.  Average usage was 1 hour and 28 minutes.  Average AHI was 19.4 on auto titrating pressure of 6 to 18 cm of water.  There was a large leak noted.    HISTORY: (copied from Dr Dohmeier's note on 02/13/2019)  Ashley M El-Aminis a 48 y.o.female, seen here as in a referral from Dr. Andris Flurry a sleep evaluation.  The patient carries a diagnosis of iron deficiency anemia, heavy menstrual periods, HTN and obesity. She has 2 children and delivered the second child by c- section.  Chief complaint according to patient :my sleep is not refreshing.  Sleep and medical history:the patient has never had a sleep test before. In childhood and youth she had no trouble sleeping, it began in her 63s. She felt not nearly as fatigued when her sons were young. BMI is 55.5 - she gained weight in peri-menopause. HTN developed after first pregnancy. Family sleep and medical history:no OSA known, no insomnia, no sleep walking.HTN in mother, who passed at age 51 of a stroke, CAD, DM , CKD, ESRD - on dialysis.  Social history:sons are 14 and 18. Working full time outside the home. Household size is 3 - her and her sons. No pets. She works 8-5, in a place without day light.  She is divorced, non smoker, ETOH use - none,  caffeine-  in form of coffee in AM ( 1 cup) and soda after lunch ( 4 times a week) .   Sleep habits are as follows:Dinner time is 6-7.30 PM, her bedtime is 10-11 PM, and she describes her bedroom as warm, quiet but not dark she keeps the TV on.  She has 2 bathroom breaks most nights, sleeps on her side and with 3 pillows. She has woken up with hot flashes , and a racing heart. She dreams infrequently.  She wakes usually at 6 AM, spontaneously with an alarm as a back up.  Her feet swell over night, so she elevates her feet. She has no headaches , no dry mouth. Her sons report she snores.    TST 6 hours average. No daytime naps.    Observations/Objective:  Generalized: Well developed, in no acute distress  Mentation: Alert oriented to time, place, history taking. Follows all commands speech and language fluent   Assessment and Plan:  48 y.o. year old female  has a past medical history of Hypertension, Iron deficiency anemia due to chronic blood loss (10/31/2017), Iron deficiency anemia, unspecified, Menometrorrhagia (10/31/2017), and Obesity. here with    ICD-10-CM   1. OSA on CPAP  G47.33    Z99.89    Berlene continues to work on nightly compliance with greater than 4-hour usage each night.  Although compliance has improved from 33% to 53% (4 hours compliance at 36.7% now), residual AHI remains elevated at 16.  We have discussed having her come in for a titration study.  I feel that this will help customize settings appropriate for her and will aid in comfort.  Titration orders placed in August by Dr. Brett Fairy.  We will contact the sleep lab today to initiate insurance approval for this procedure.  Malaak will continue working on nightly use at home.  She will follow-up with me pending titration study results.  She verbalizes understanding and agreement with this plan.   No orders of the defined types were placed in this encounter.   No orders of the defined types were placed in this encounter.    Follow Up Instructions:  I discussed the assessment and treatment plan with the patient. The patient was provided an opportunity to ask questions and all were answered. The patient agreed with the plan and demonstrated an understanding of the instructions.   The patient was advised to call back or seek an in-person evaluation if the symptoms worsen or if the condition fails to improve as anticipated.  I provided 15 minutes of non-face-to-face time during this encounter. Patient is located at her place of residence. Provider is in the office.    Debbora Presto, NP

## 2019-08-20 ENCOUNTER — Telehealth (INDEPENDENT_AMBULATORY_CARE_PROVIDER_SITE_OTHER): Payer: 59 | Admitting: Family Medicine

## 2019-08-20 ENCOUNTER — Other Ambulatory Visit: Payer: Self-pay | Admitting: Nurse Practitioner

## 2019-08-20 ENCOUNTER — Encounter: Payer: Self-pay | Admitting: Family Medicine

## 2019-08-20 DIAGNOSIS — G4733 Obstructive sleep apnea (adult) (pediatric): Secondary | ICD-10-CM | POA: Diagnosis not present

## 2019-08-20 DIAGNOSIS — Z9989 Dependence on other enabling machines and devices: Secondary | ICD-10-CM

## 2019-08-20 MED ORDER — VITAMIN D (ERGOCALCIFEROL) 1.25 MG (50000 UNIT) PO CAPS: 50000.0000 [IU] | ORAL_CAPSULE | ORAL | 1 refills | Status: DC

## 2019-09-05 ENCOUNTER — Ambulatory Visit: Payer: 59 | Admitting: Internal Medicine

## 2019-09-25 ENCOUNTER — Inpatient Hospital Stay: Payer: 59 | Attending: Family

## 2019-09-25 ENCOUNTER — Inpatient Hospital Stay: Payer: 59 | Admitting: Family

## 2019-10-02 ENCOUNTER — Other Ambulatory Visit: Payer: Self-pay

## 2019-10-02 ENCOUNTER — Encounter: Payer: Self-pay | Admitting: Internal Medicine

## 2019-10-02 ENCOUNTER — Ambulatory Visit: Payer: 59 | Admitting: Internal Medicine

## 2019-10-02 VITALS — BP 134/88 | HR 95 | Temp 97.6°F | Ht 62.6 in | Wt 306.8 lb

## 2019-10-02 DIAGNOSIS — Z6841 Body Mass Index (BMI) 40.0 and over, adult: Secondary | ICD-10-CM | POA: Diagnosis not present

## 2019-10-02 DIAGNOSIS — M5431 Sciatica, right side: Secondary | ICD-10-CM

## 2019-10-02 DIAGNOSIS — I10 Essential (primary) hypertension: Secondary | ICD-10-CM | POA: Diagnosis not present

## 2019-10-02 MED ORDER — CYCLOBENZAPRINE HCL 10 MG PO TABS: ORAL_TABLET | ORAL | 0 refills | Status: DC

## 2019-10-02 NOTE — Progress Notes (Signed)
This visit occurred during the SARS-CoV-2 public health emergency.  Safety protocols were in place, including screening questions prior to the visit, additional usage of staff PPE, and extensive cleaning of exam room while observing appropriate contact time as indicated for disinfecting solutions.  Subjective:     Patient ID: Ashley Harrison , female    DOB: 1970/09/18 , 49 y.o.   MRN: [id number]   Chief Complaint  Patient presents with  . Back Pain    HPI  She is here today for further evaluation of low back pain. She denies fall/trauma.  Not sure what could have triggered her pain. No relief with OTC meds.   Back Pain This is a new problem. The current episode started more than 1 month ago. The pain is present in the gluteal. The pain radiates to the right thigh. The pain is at a severity of 2/10. The pain is mild. The pain is worse during the night. Pertinent negatives include no abdominal pain.     Past Medical History:  Diagnosis Date  . Hypertension   . Iron deficiency anemia due to chronic blood loss 10/31/2017  . Iron deficiency anemia, unspecified   . Menometrorrhagia 10/31/2017  . Obesity      Family History  Problem Relation Age of Onset  . Diabetes Mother   . Hyperlipidemia Father   . Hypertension Father      Current Outpatient Medications:  .  telmisartan-hydrochlorothiazide (MICARDIS HCT) 80-25 MG tablet, Take 1 tablet by mouth daily., Disp: 30 tablet, Rfl: 1 .  Vitamin D, Ergocalciferol, (DRISDOL) 1.25 MG (50000 UT) CAPS capsule, Take 1 capsule (50,000 Units total) by mouth 2 (two) times a week., Disp: 24 capsule, Rfl: 1   Allergies  Allergen Reactions  . Shellfish Allergy Hives     Review of Systems  Constitutional: Negative.   Respiratory: Negative.   Cardiovascular: Negative.   Gastrointestinal: Negative.  Negative for abdominal pain.  Musculoskeletal: Positive for back pain.  Neurological: Negative.   Psychiatric/Behavioral: Negative.       Today's Vitals   10/02/19 1542  BP: 134/88  Pulse: 95  Temp: 97.6 F (36.4 C)  TempSrc: Oral  Weight: (!) 306 lb 12.8 oz (139.2 kg)  Height: 5' 2.6" (1.59 m)  PainSc: 3   PainLoc: Hip   Body mass index is 55.04 kg/m.   Objective:  Physical Exam Vitals and nursing note reviewed.  Constitutional:      Appearance: Normal appearance. She is obese.  HENT:     Head: Normocephalic and atraumatic.  Cardiovascular:     Rate and Rhythm: Normal rate and regular rhythm.     Heart sounds: Normal heart sounds.  Pulmonary:     Effort: Pulmonary effort is normal.     Breath sounds: Normal breath sounds.  Musculoskeletal:     Lumbar back: Spasms and tenderness present.     Comments: Pain with lateral rotation.  Skin:    General: Skin is warm.  Neurological:     General: No focal deficit present.     Mental Status: She is alert.  Psychiatric:        Mood and Affect: Mood normal.        Behavior: Behavior normal.         Assessment And Plan:     1. Back pain with right-sided sciatica  Acute. She will be given rx flexeril 42m po qhs prn. She will let me know if her sx persist. Also advised to apply mentholated  rub to affected area nightly and to take magnesium nightly.   2. Essential hypertension, benign  Fair control despite back discomfort. She will continue with current meds. She is encouraged to avoid adding salt to her foods. I will check renal function today.   - BMP8+EGFR  3. Class 3 severe obesity due to excess calories with serious comorbidity and body mass index (BMI) of 50.0 to 59.9 in adult (HCC)  BMI 55. I suggest her initial goal is achive BMI less than 49 to decrease cardiac risk. She is encouraged to incorporate more exercise into her daily routine, as tolerated.    [name], MD    THE PATIENT IS ENCOURAGED TO PRACTICE SOCIAL DISTANCING DUE TO THE COVID-19 PANDEMIC.

## 2019-10-02 NOTE — Patient Instructions (Signed)
Acute Back Pain, Adult Acute back pain is sudden and usually short-lived. It is often caused by an injury to the muscles and tissues in the back. The injury may result from:  A muscle or ligament getting overstretched or torn (strained). Ligaments are tissues that connect bones to each other. Lifting something improperly can cause a back strain.  Wear and tear (degeneration) of the spinal disks. Spinal disks are circular tissue that provides cushioning between the bones of the spine (vertebrae).  Twisting motions, such as while playing sports or doing yard work.  A hit to the back.  Arthritis. You may have a physical exam, lab tests, and imaging tests to find the cause of your pain. Acute back pain usually goes away with rest and home care. Follow these instructions at home: Managing pain, stiffness, and swelling  Take over-the-counter and prescription medicines only as told by your health care provider.  Your health care provider may recommend applying ice during the first 24-48 hours after your pain starts. To do this: ? Put ice in a plastic bag. ? Place a towel between your skin and the bag. ? Leave the ice on for 20 minutes, 2-3 times a day.  If directed, apply heat to the affected area as often as told by your health care provider. Use the heat source that your health care provider recommends, such as a moist heat pack or a heating pad. ? Place a towel between your skin and the heat source. ? Leave the heat on for 20-30 minutes. ? Remove the heat if your skin turns bright red. This is especially important if you are unable to feel pain, heat, or cold. You have a greater risk of getting burned. Activity   Do not stay in bed. Staying in bed for more than 1-2 days can delay your recovery.  Sit up and stand up straight. Avoid leaning forward when you sit, or hunching over when you stand. ? If you work at a desk, sit close to it so you do not need to lean over. Keep your chin tucked  in. Keep your neck drawn back, and keep your elbows bent at a right angle. Your arms should look like the letter "L." ? Sit high and close to the steering wheel when you drive. Add lower back (lumbar) support to your car seat, if needed.  Take short walks on even surfaces as soon as you are able. Try to increase the length of time you walk each day.  Do not sit, drive, or stand in one place for more than 30 minutes at a time. Sitting or standing for long periods of time can put stress on your back.  Do not drive or use heavy machinery while taking prescription pain medicine.  Use proper lifting techniques. When you bend and lift, use positions that put less stress on your back: ? Bend your knees. ? Keep the load close to your body. ? Avoid twisting.  Exercise regularly as told by your health care provider. Exercising helps your back heal faster and helps prevent back injuries by keeping muscles strong and flexible.  Work with a physical therapist to make a safe exercise program, as recommended by your health care provider. Do any exercises as told by your physical therapist. Lifestyle  Maintain a healthy weight. Extra weight puts stress on your back and makes it difficult to have good posture.  Avoid activities or situations that make you feel anxious or stressed. Stress and anxiety increase muscle   tension and can make back pain worse. Learn ways to manage anxiety and stress, such as through exercise. General instructions  Sleep on a firm mattress in a comfortable position. Try lying on your side with your knees slightly bent. If you lie on your back, put a pillow under your knees.  Follow your treatment plan as told by your health care provider. This may include: ? Cognitive or behavioral therapy. ? Acupuncture or massage therapy. ? Meditation or yoga. Contact a health care provider if:  You have pain that is not relieved with rest or medicine.  You have increasing pain going down  into your legs or buttocks.  Your pain does not improve after 2 weeks.  You have pain at night.  You lose weight without trying.  You have a fever or chills. Get help right away if:  You develop new bowel or bladder control problems.  You have unusual weakness or numbness in your arms or legs.  You develop nausea or vomiting.  You develop abdominal pain.  You feel faint. Summary  Acute back pain is sudden and usually short-lived.  Use proper lifting techniques. When you bend and lift, use positions that put less stress on your back.  Take over-the-counter and prescription medicines and apply heat or ice as directed by your health care provider. This information is not intended to replace advice given to you by your health care provider. Make sure you discuss any questions you have with your health care provider. Document Revised: 12/04/2018 Document Reviewed: 03/29/2017 [employer] Patient Education  2020 [employer]  

## 2019-10-03 LAB — BMP8+EGFR
BUN/Creatinine Ratio: 14 (ref 9–23)
BUN: 10 mg/dL (ref 6–24)
CO2: 26 mmol/L (ref 20–29)
Calcium: 9.8 mg/dL (ref 8.7–10.2)
Chloride: 99 mmol/L (ref 96–106)
Creatinine, Ser: 0.71 mg/dL (ref 0.57–1.00)
GFR calc Af Amer: 115 mL/min/{1.73_m2} (ref >59)
GFR calc non Af Amer: 100 mL/min/{1.73_m2} (ref >59)
Glucose: 101 mg/dL — ABNORMAL HIGH (ref 65–99)
Potassium: 3.9 mmol/L (ref 3.5–5.2)
Sodium: 140 mmol/L (ref 134–144)

## 2019-10-09 ENCOUNTER — Ambulatory Visit: Payer: 59 | Admitting: Nurse Practitioner

## 2019-10-18 ENCOUNTER — Other Ambulatory Visit: Payer: Self-pay | Admitting: Family

## 2019-11-26 ENCOUNTER — Other Ambulatory Visit: Payer: Self-pay

## 2019-11-26 ENCOUNTER — Encounter: Payer: Self-pay | Admitting: Internal Medicine

## 2019-11-26 MED ORDER — TELMISARTAN-HCTZ 80-25 MG PO TABS: 1.0000 | ORAL_TABLET | Freq: Every day | ORAL | 1 refills | Status: DC

## 2019-11-29 ENCOUNTER — Other Ambulatory Visit: Payer: Self-pay | Admitting: Internal Medicine

## 2020-01-20 ENCOUNTER — Other Ambulatory Visit: Payer: Self-pay | Admitting: Internal Medicine

## 2020-06-08 LAB — HM MAMMOGRAPHY

## 2020-06-10 ENCOUNTER — Other Ambulatory Visit: Payer: Self-pay | Admitting: Internal Medicine

## 2020-07-13 ENCOUNTER — Encounter: Payer: Self-pay | Admitting: Internal Medicine

## 2020-07-13 ENCOUNTER — Other Ambulatory Visit: Payer: Self-pay

## 2020-07-13 ENCOUNTER — Encounter: Payer: 59 | Admitting: Nurse Practitioner

## 2020-07-13 ENCOUNTER — Ambulatory Visit: Payer: 59 | Admitting: Internal Medicine

## 2020-07-13 VITALS — BP 112/66 | HR 98 | Temp 98.8°F | Ht 62.0 in | Wt 322.6 lb

## 2020-07-13 DIAGNOSIS — Z0000 Encounter for general adult medical examination without abnormal findings: Secondary | ICD-10-CM

## 2020-07-13 DIAGNOSIS — I10 Essential (primary) hypertension: Secondary | ICD-10-CM | POA: Diagnosis not present

## 2020-07-13 DIAGNOSIS — R0601 Orthopnea: Secondary | ICD-10-CM

## 2020-07-13 DIAGNOSIS — Z6841 Body Mass Index (BMI) 40.0 and over, adult: Secondary | ICD-10-CM

## 2020-07-13 DIAGNOSIS — E66813 Obesity, class 3: Secondary | ICD-10-CM

## 2020-07-13 DIAGNOSIS — Z1211 Encounter for screening for malignant neoplasm of colon: Secondary | ICD-10-CM | POA: Diagnosis not present

## 2020-07-13 LAB — POCT URINALYSIS DIPSTICK
Bilirubin, UA: NEGATIVE
Blood, UA: NEGATIVE
Glucose, UA: NEGATIVE
Ketones, UA: NEGATIVE
Leukocytes, UA: NEGATIVE
Nitrite, UA: NEGATIVE
Protein, UA: NEGATIVE
Spec Grav, UA: >=1.03 — AB (ref 1.010–1.025)
Urobilinogen, UA: 1 E.U./dL
pH, UA: 6 (ref 5.0–8.0)

## 2020-07-13 LAB — POCT UA - MICROALBUMIN
Albumin/Creatinine Ratio, Urine, POC: <30
Creatinine, POC: 100 mg/dL
Microalbumin Ur, POC: 10 mg/L

## 2020-07-13 NOTE — Patient Instructions (Signed)
Health Maintenance, Female Adopting a healthy lifestyle and getting preventive care are important in promoting health and wellness. Ask your health care provider about:  The right schedule for you to have regular tests and exams.  Things you can do on your own to prevent diseases and keep yourself healthy. What should I know about diet, weight, and exercise? Eat a healthy diet   Eat a diet that includes plenty of vegetables, fruits, low-fat dairy products, and lean protein.  Do not eat a lot of foods that are high in solid fats, added sugars, or sodium. Maintain a healthy weight Body mass index (BMI) is used to identify weight problems. It estimates body fat based on height and weight. Your health care provider can help determine your BMI and help you achieve or maintain a healthy weight. Get regular exercise Get regular exercise. This is one of the most important things you can do for your health. Most adults should:  Exercise for at least 150 minutes each week. The exercise should increase your heart rate and make you sweat (moderate-intensity exercise).  Do strengthening exercises at least twice a week. This is in addition to the moderate-intensity exercise.  Spend less time sitting. Even light physical activity can be beneficial. Watch cholesterol and blood lipids Have your blood tested for lipids and cholesterol at 51 years of age, then have this test every 5 years. Have your cholesterol levels checked more often if:  Your lipid or cholesterol levels are high.  You are older than 51 years of age.  You are at high risk for heart disease. What should I know about cancer screening? Depending on your health history and family history, you may need to have cancer screening at various ages. This may include screening for:  Breast cancer.  Cervical cancer.  Colorectal cancer.  Skin cancer.  Lung cancer. What should I know about heart disease, diabetes, and high blood  pressure? Blood pressure and heart disease  High blood pressure causes heart disease and increases the risk of stroke. This is more likely to develop in people who have high blood pressure readings, are of African descent, or are overweight.  Have your blood pressure checked: ? Every 3-5 years if you are 18-39 years of age. ? Every year if you are 40 years old or older. Diabetes Have regular diabetes screenings. This checks your fasting blood sugar level. Have the screening done:  Once every three years after age 40 if you are at a normal weight and have a low risk for diabetes.  More often and at a younger age if you are overweight or have a high risk for diabetes. What should I know about preventing infection? Hepatitis B If you have a higher risk for hepatitis B, you should be screened for this virus. Talk with your health care provider to find out if you are at risk for hepatitis B infection. Hepatitis C Testing is recommended for:  Everyone born from 1945 through 1965.  Anyone with known risk factors for hepatitis C. Sexually transmitted infections (STIs)  Get screened for STIs, including gonorrhea and chlamydia, if: ? You are sexually active and are younger than 51 years of age. ? You are older than 51 years of age and your health care provider tells you that you are at risk for this type of infection. ? Your sexual activity has changed since you were last screened, and you are at increased risk for chlamydia or gonorrhea. Ask your health care provider if   you are at risk.  Ask your health care provider about whether you are at high risk for HIV. Your health care provider may recommend a prescription medicine to help prevent HIV infection. If you choose to take medicine to prevent HIV, you should first get tested for HIV. You should then be tested every 3 months for as long as you are taking the medicine. Pregnancy  If you are about to stop having your period (premenopausal) and  you may become pregnant, seek counseling before you get pregnant.  Take 400 to 800 micrograms (mcg) of folic acid every day if you become pregnant.  Ask for birth control (contraception) if you want to prevent pregnancy. Osteoporosis and menopause Osteoporosis is a disease in which the bones lose minerals and strength with aging. This can result in bone fractures. If you are 65 years old or older, or if you are at risk for osteoporosis and fractures, ask your health care provider if you should:  Be screened for bone loss.  Take a calcium or vitamin D supplement to lower your risk of fractures.  Be given hormone replacement therapy (HRT) to treat symptoms of menopause. Follow these instructions at home: Lifestyle  Do not use any products that contain nicotine or tobacco, such as cigarettes, e-cigarettes, and chewing tobacco. If you need help quitting, ask your health care provider.  Do not use street drugs.  Do not share needles.  Ask your health care provider for help if you need support or information about quitting drugs. Alcohol use  Do not drink alcohol if: ? Your health care provider tells you not to drink. ? You are pregnant, may be pregnant, or are planning to become pregnant.  If you drink alcohol: ? Limit how much you use to 0-1 drink a day. ? Limit intake if you are breastfeeding.  Be aware of how much alcohol is in your drink. In the [place] one drink equals one 12 oz bottle of beer (355 mL), one 5 oz glass of wine (148 mL), or one 1 oz glass of hard liquor (44 mL). General instructions  Schedule regular health, dental, and eye exams.  Stay current with your vaccines.  Tell your health care provider if: ? You often feel depressed. ? You have ever been abused or do not feel safe at home. Summary  Adopting a healthy lifestyle and getting preventive care are important in promoting health and wellness.  Follow your health care provider's instructions about healthy  diet, exercising, and getting tested or screened for diseases.  Follow your health care provider's instructions on monitoring your cholesterol and blood pressure. This information is not intended to replace advice given to you by your health care provider. Make sure you discuss any questions you have with your health care provider. Document Revised: 08/08/2018 Document Reviewed: 08/08/2018 Elsevier Patient Education  2020 [employer]  

## 2020-07-13 NOTE — Progress Notes (Signed)
I,Ashley Harrison,acting as a Education administrator for Ashley Greenland, MD.,have documented all relevant documentation on the behalf of Ashley Greenland, MD   This visit occurred during the SARS-CoV-2 public health emergency.  Safety protocols were in place, including screening questions prior to the visit, additional usage of staff PPE, and extensive cleaning of exam room while observing appropriate contact time as indicated for disinfecting solutions.  Subjective:     Patient ID: Ashley Harrison , female    DOB: November 09, 1970 , 49 y.o.   MRN: [id number]   Chief Complaint  Patient presents with  . Annual Exam  . Hypertension    HPI  The patient is here today for a physical examination. She is followed by Dr. Diamond Nickel for her GYN exams. She was last seen in October 2021.   Hypertension This is a chronic problem. The current episode started more than 1 year ago. The problem has been gradually worsening since onset. The problem is uncontrolled. Associated symptoms include shortness of breath. Pertinent negatives include no anxiety or peripheral edema. There are no associated agents to hypertension. Risk factors for coronary artery disease include obesity and sedentary lifestyle. Past treatments include angiotensin blockers and diuretics. There are no compliance problems.  There is no history of angina or kidney disease.     Past Medical History:  Diagnosis Date  . Hypertension   . Iron deficiency anemia due to chronic blood loss 10/31/2017  . Iron deficiency anemia, unspecified   . Menometrorrhagia 10/31/2017  . Obesity      Family History  Problem Relation Age of Onset  . Diabetes Mother   . Hyperlipidemia Father   . Hypertension Father      Current Outpatient Medications:  .  telmisartan-hydrochlorothiazide (MICARDIS HCT) 80-25 MG tablet, Take 1 tablet by mouth once daily, Disp: 90 tablet, Rfl: 0 .  Vitamin D, Ergocalciferol, (DRISDOL) 1.25 MG (50000 UT) CAPS capsule, Take 1 capsule (50,000 Units  total) by mouth 2 (two) times a week. (Patient not taking: Reported on 07/13/2020), Disp: 24 capsule, Rfl: 1   Allergies  Allergen Reactions  . Shellfish Allergy Hives      The patient states she uses none for birth control. Last LMP was No LMP recorded. Patient has had an ablation.. Negative for Dysmenorrhea. Negative for: breast discharge, breast lump(s), breast pain and breast self exam. Associated symptoms include abnormal vaginal bleeding. Pertinent negatives include abnormal bleeding (hematology), anxiety, decreased libido, depression, difficulty falling sleep, dyspareunia, history of infertility, nocturia, sexual dysfunction, sleep disturbances, urinary incontinence, urinary urgency, vaginal discharge and vaginal itching. Diet regular.The patient states her exercise level is    . The patient's tobacco use is:  Social History   Tobacco Use  Smoking Status Never Smoker  Smokeless Tobacco Never Used  . She has been exposed to passive smoke. The patient's alcohol use is:  Social History   Substance and Sexual Activity  Alcohol Use No    Review of Systems  Constitutional: Negative.   HENT: Negative.   Eyes: Negative.   Respiratory: Positive for shortness of breath.   Cardiovascular: Negative.        She c/o SOB when lyling flat. This is a new sx. Denies chest pain and palpitations. Admits she is not getting much exercise. Also with occasional LE edema.   Gastrointestinal: Negative.   Endocrine: Negative.   Genitourinary: Negative.   Musculoskeletal: Negative.   Skin: Negative.   Allergic/Immunologic: Negative.   Hematological: Negative.   Psychiatric/Behavioral: Negative.  Today's Vitals   07/13/20 1453  BP: 112/66  Pulse: 98  Temp: 98.8 F (37.1 C)  TempSrc: Oral  Weight: (!) 322 lb 9.6 oz (146.3 kg)  Height: 5' 2"  (1.575 m)   Body mass index is 59 kg/m.  Wt Readings from Last 3 Encounters:  07/13/20 (!) 322 lb 9.6 oz (146.3 kg)  10/02/19 (!) 306 lb 12.8 oz  (139.2 kg)  08/14/19 (!) 300 lb 6.4 oz (136.3 kg)   Objective:  Physical Exam Constitutional:      General: She is not in acute distress.    Appearance: Normal appearance. She is well-developed. She is obese.  HENT:     Head: Normocephalic and atraumatic.     Right Ear: Hearing, tympanic membrane, ear canal and external ear normal. There is no impacted cerumen.     Left Ear: Hearing, tympanic membrane, ear canal and external ear normal. There is no impacted cerumen.     Nose:     Comments: Deferred, masked    Mouth/Throat:     Comments: Deferred, masked Eyes:     General: Lids are normal.     Extraocular Movements: Extraocular movements intact.     Conjunctiva/sclera: Conjunctivae normal.     Pupils: Pupils are equal, round, and reactive to light.     Funduscopic exam:    Right eye: No papilledema.        Left eye: No papilledema.  Neck:     Thyroid: No thyroid mass.     Vascular: No carotid bruit.  Cardiovascular:     Rate and Rhythm: Normal rate and regular rhythm.     Pulses: Normal pulses.     Heart sounds: Normal heart sounds. No murmur heard.   Pulmonary:     Effort: Pulmonary effort is normal.  Chest:     Breasts: Tanner Score is 5.        Right: Normal.        Left: Normal.  Abdominal:     General: Bowel sounds are normal. There is no distension.     Palpations: Abdomen is soft.     Tenderness: There is no abdominal tenderness.     Comments: Rounded, soft, obese  Musculoskeletal:        General: No swelling. Normal range of motion.     Cervical back: Full passive range of motion without pain, normal range of motion and neck supple.     Right lower leg: Edema present.     Left lower leg: Edema present.  Skin:    General: Skin is warm and dry.     Capillary Refill: Capillary refill takes less than 2 seconds.  Neurological:     General: No focal deficit present.     Mental Status: She is alert and oriented to person, place, and time.     Cranial Nerves: No  cranial nerve deficit.     Sensory: No sensory deficit.  Psychiatric:        Mood and Affect: Mood normal.        Behavior: Behavior normal.        Thought Content: Thought content normal.        Judgment: Judgment normal.         Assessment And Plan:     1. Health maintenance examination Comments: A full exam was performed. Importance of monthly self breast exams was discussed with the patient. PATIENT IS ADVISED TO GET 30-45 MINUTES REGULAR EXERCISE NO LESS THAN FOUR TO FIVE DAYS  PER WEEK - BOTH WEIGHTBEARING EXERCISES AND AEROBIC ARE RECOMMENDED.  PATIENT IS ADVISED TO FOLLOW A HEALTHY DIET WITH AT LEAST SIX FRUITS/VEGGIES PER DAY, DECREASE INTAKE OF RED MEAT, AND TO INCREASE FISH INTAKE TO TWO DAYS PER WEEK.  MEATS/FISH SHOULD NOT BE FRIED, BAKED OR BROILED IS PREFERABLE.  I SUGGEST WEARING SPF 50 SUNSCREEN ON EXPOSED PARTS AND ESPECIALLY WHEN IN THE DIRECT SUNLIGHT FOR AN EXTENDED PERIOD OF TIME.  PLEASE AVOID FAST FOOD RESTAURANTS AND INCREASE YOUR WATER INTAKE.  - Hemoglobin A1c - Hepatitis C antibody - CBC - CMP14+EGFR - Lipid panel - Ambulatory referral to Gastroenterology - Insulin, random([phone])  2. Essential hypertension, benign Comments: Chronic, well controlled. She will continue with current meds. She is encouraged to avoid adding salt to her foods. EKG performed, NSR w/o acute changes.  She will rto in six months for re-evaluation.  - POCT Urinalysis Dipstick (81002) - POCT UA - Microalbumin - EKG 12-Lead  3. Orthopnea Comments: I will check BNP today. I will consider checking 2d Echocardiogram as well.   4. Class 3 severe obesity due to excess calories with serious comorbidity and body mass index (BMI) of 50.0 to 59.9 in adult [facility]) Comments: She is encouraged to initially strive for BMI less than 47 to decrease cardiac risk. Aiming to lose ten percent of her body weight is also reasonable.   5. Screen for colon cancer Comments: She agrees to GI referral for CRC  screening.  - Ambulatory referral to Gastroenterology     Patient was given opportunity to ask questions. Patient verbalized understanding of the plan and was able to repeat key elements of the plan. All questions were answered to their satisfaction.   Ashley Greenland, MD   I, Ashley Greenland, MD, have reviewed all documentation for this visit. The documentation on 07/20/20 for the exam, diagnosis, procedures, and orders are all accurate and complete.  THE PATIENT IS ENCOURAGED TO PRACTICE SOCIAL DISTANCING DUE TO THE COVID-19 PANDEMIC.  ,as directed by  Ashley Greenland, MD while in the presence of Ashley Greenland, MD.

## 2020-07-14 LAB — CBC
Hematocrit: 33.9 % — ABNORMAL LOW (ref 34.0–46.6)
Hemoglobin: 10.9 g/dL — ABNORMAL LOW (ref 11.1–15.9)
MCH: 26.4 pg — ABNORMAL LOW (ref 26.6–33.0)
MCHC: 32.2 g/dL (ref 31.5–35.7)
MCV: 82 fL (ref 79–97)
Platelets: 401 10*3/uL (ref 150–450)
RBC: 4.13 x10E6/uL (ref 3.77–5.28)
RDW: 14.2 % (ref 11.7–15.4)
WBC: 10.6 10*3/uL (ref 3.4–10.8)

## 2020-07-14 LAB — LIPID PANEL
Chol/HDL Ratio: 2.9 ratio (ref 0.0–4.4)
Cholesterol, Total: 209 mg/dL — ABNORMAL HIGH (ref 100–199)
HDL: 72 mg/dL (ref >39)
LDL Chol Calc (NIH): 120 mg/dL — ABNORMAL HIGH (ref 0–99)
Triglycerides: 94 mg/dL (ref 0–149)
VLDL Cholesterol Cal: 17 mg/dL (ref 5–40)

## 2020-07-14 LAB — BRAIN NATRIURETIC PEPTIDE: BNP: 15.4 pg/mL (ref 0.0–100.0)

## 2020-07-14 LAB — CMP14+EGFR
ALT: 19 IU/L (ref 0–32)
AST: 15 IU/L (ref 0–40)
Albumin/Globulin Ratio: 1.6 (ref 1.2–2.2)
Albumin: 4.1 g/dL (ref 3.8–4.8)
Alkaline Phosphatase: 112 IU/L (ref 44–121)
BUN/Creatinine Ratio: 12 (ref 9–23)
BUN: 9 mg/dL (ref 6–24)
Bilirubin Total: <0.2 mg/dL (ref 0.0–1.2)
CO2: 24 mmol/L (ref 20–29)
Calcium: 9.3 mg/dL (ref 8.7–10.2)
Chloride: 103 mmol/L (ref 96–106)
Creatinine, Ser: 0.77 mg/dL (ref 0.57–1.00)
GFR calc Af Amer: 104 mL/min/{1.73_m2} (ref >59)
GFR calc non Af Amer: 90 mL/min/{1.73_m2} (ref >59)
Globulin, Total: 2.5 g/dL (ref 1.5–4.5)
Glucose: 94 mg/dL (ref 65–99)
Potassium: 4.1 mmol/L (ref 3.5–5.2)
Sodium: 141 mmol/L (ref 134–144)
Total Protein: 6.6 g/dL (ref 6.0–8.5)

## 2020-07-14 LAB — INSULIN, RANDOM: INSULIN: 69.5 u[IU]/mL — ABNORMAL HIGH (ref 2.6–24.9)

## 2020-07-14 LAB — HEMOGLOBIN A1C
Est. average glucose Bld gHb Est-mCnc: 120 mg/dL
Hgb A1c MFr Bld: 5.8 % — ABNORMAL HIGH (ref 4.8–5.6)

## 2020-07-14 LAB — HEPATITIS C ANTIBODY: Hep C Virus Ab: <0.1 s/co ratio (ref 0.0–0.9)

## 2020-07-15 ENCOUNTER — Encounter: Payer: Self-pay | Admitting: Internal Medicine

## 2020-09-02 ENCOUNTER — Encounter: Payer: 59 | Admitting: Nurse Practitioner

## 2020-09-03 ENCOUNTER — Telehealth: Payer: 59 | Admitting: Internal Medicine

## 2020-09-03 ENCOUNTER — Ambulatory Visit: Payer: 59 | Admitting: Internal Medicine

## 2020-09-04 ENCOUNTER — Ambulatory Visit: Payer: 59 | Admitting: Nurse Practitioner

## 2020-09-10 ENCOUNTER — Other Ambulatory Visit: Payer: Self-pay

## 2020-09-10 ENCOUNTER — Ambulatory Visit: Payer: 59 | Admitting: Nurse Practitioner

## 2020-09-10 ENCOUNTER — Encounter: Payer: Self-pay | Admitting: Nurse Practitioner

## 2020-09-10 VITALS — BP 128/68 | HR 87 | Temp 98.2°F | Ht 62.2 in | Wt 310.0 lb

## 2020-09-10 DIAGNOSIS — E8881 Metabolic syndrome: Secondary | ICD-10-CM

## 2020-09-10 DIAGNOSIS — Z6841 Body Mass Index (BMI) 40.0 and over, adult: Secondary | ICD-10-CM

## 2020-09-10 MED ORDER — RYBELSUS 7 MG PO TABS: 7.0000 mg | ORAL_TABLET | Freq: Every day | ORAL | 1 refills | Status: DC

## 2020-09-10 NOTE — Patient Instructions (Addendum)
Insulin Resistance  Insulin is a hormone that is made by the pancreas. Insulin allows blood sugar (glucose) to enter the cells in the body. Insulin helps the body use glucose for energy. Normally, the body is insulin sensitive, which means the cells in the body are effective at absorbing glucose. Insulin resistance is when the cells in the body do not respond properly to insulin and are not able to absorb glucose. The pancreas makes more insulin, but over time the body cannot make enough insulin to keep glucose at normal levels. Insulin resistance results in high blood glucose levels (hyperglycemia) and can lead to problems, including:  Prediabetes.  Type 2 diabetes (diabetes mellitus).  Heart disease.  High blood pressure (hypertension).  Stroke.  Polycystic ovary syndrome (PCOS).  Nonalcoholic fatty liver disease. What are the causes? The exact cause of insulin resistance is not known. What increases the risk? The following factors may make you more likely to develop insulin resistance:  Being overweight or obese, especially if a lot of your weight is in your waist area.  Having an inactive (sedentary) lifestyle.  Having above-normal glucose levels.  Having abnormal cholesterol levels.  Having sleep apnea.  Being older than age 45.  Using steroids. What are the signs or symptoms? This condition usually does not cause symptoms. A waist measurement of more than 35 inches (88.9 cm) for women and more than 40 inches (101.6 cm) for men may be a sign of insulin resistance. How is this diagnosed? There is no test to diagnose insulin resistance. However, your health care provider may diagnose insulin resistance based on:  A physical exam.  Your medical history.  Blood tests that check your blood glucose level. How is this treated? Insulin resistance is treated with nutrition and lifestyle changes. These changes may include:  Eating a healthy balance of nutritious  foods.  Getting more physical activity.  Maintaining a healthy weight.  Stopping the use of any tobacco products. Your health care provider will work with you to change your nutrition and lifestyle as needed. In some cases, treatment may also include medicine to improve your insulin sensitivity. Follow these instructions at home: Activity  Be physically active. Do moderate-intensity physical activity for at least 30 minutes on 5 or more days of the week, or as told by your health care provider. This could include brisk walking, biking, or water aerobics.  Ask your health care provider what activities are safe for you. A mix of physical activities may be best, such as walking, swimming, biking, and strength training. Eating and drinking  Follow a healthy meal plan. This includes eating: ? Lean proteins. ? Complex carbohydrates. Examples of these include whole grains, starchy vegetables (potatoes, corn, peas), and beans. ? Fresh fruits and vegetables. ? Low-fat dairy products. ? Healthy fats.  Follow instructions from your health care provider about eating or drinking restrictions.  Make an appointment to see a diet and nutrition specialist (registered dietitian) to help you create a healthy eating plan.   General instructions  Check your blood glucose levels as told by your health care provider.  Take over-the-counter and prescription medicines only as told by your health care provider.  Lose weight as told by your health care provider. ? Losing 5-7% of your body weight can reverse insulin resistance. ? Your health care provider can determine how much weight loss is best for you and can help you lose weight safely.  Do not use any products that contain nicotine or tobacco. These   products include cigarettes, chewing tobacco, and vaping devices, such as e-cigarettes. If you need help quitting, ask your health care provider.  Keep all follow-up visits. This is important. Contact a  health care provider if:  You have trouble losing weight or maintaining your goal weight.  You gain weight.  You have trouble following your prescribed meal plan.  You have trouble exercising more. Summary  Insulin resistance occurs when cells in the body do not respond properly to insulin and are not able to absorb blood sugar (glucose). The body makes more insulin, but over time the body cannot make enough insulin to keep blood sugar at normal levels.  Insulin resistance is treated with nutrition and lifestyle changes, including eating a healthy balance of nutritious foods, getting more physical activity, and maintaining a healthy weight.  Your health care provider will work with you to change your nutrition and lifestyle as needed. Treatment may also include medicine to improve your insulin sensitivity.  Check your blood glucose levels as told by your health care provider.  Keep all follow-up visits. This is important. This information is not intended to replace advice given to you by your health care provider. Make sure you discuss any questions you have with your health care provider. Document Revised: 05/04/2020 Document Reviewed: 05/04/2020 Elsevier Patient Education  2021 [employer] Obesity, Adult Obesity is the condition of having too much total body fat. Being overweight or obese means that your weight is greater than what is considered healthy for your body size. Obesity is determined by a measurement called BMI. BMI is an estimate of body fat and is calculated from height and weight. For adults, a BMI of 30 or higher is considered obese. Obesity can lead to other health concerns and major illnesses, including:  Stroke.  Coronary artery disease (CAD).  Type 2 diabetes.  Some types of cancer, including cancers of the colon, breast, uterus, and gallbladder.  Osteoarthritis.  High blood pressure (hypertension).  High cholesterol.  Sleep apnea.  Gallbladder  stones.  Infertility problems. What are the causes? Common causes of this condition include:  Eating daily meals that are high in calories, sugar, and fat.  Being born with genes that may make you more likely to become obese.  Having a medical condition that causes obesity, including: ? Hypothyroidism. ? Polycystic ovarian syndrome (PCOS). ? Binge-eating disorder. ? Cushing syndrome.  Taking certain medicines, such as steroids, antidepressants, and seizure medicines.  Not being physically active (sedentary lifestyle).  Not getting enough sleep.  Drinking high amounts of sugar-sweetened beverages, such as soft drinks. What increases the risk? The following factors may make you more likely to develop this condition:  Having a family history of obesity.  Being a woman of African American descent.  Being a man of Hispanic descent.  Living in an area with limited access to: ? [place] [facility], recreation centers, or sidewalks. ? Healthy food choices, such as grocery stores and farmers' markets. What are the signs or symptoms? The main sign of this condition is having too much body fat. How is this diagnosed? This condition is diagnosed based on:  Your BMI. If you are an adult with a BMI of 30 or higher, you are considered obese.  Your waist circumference. This measures the distance around your waistline.  Your skinfold thickness. Your health care provider may gently pinch a fold of your skin and measure it. You may have other tests to check for underlying conditions. How is this treated? Treatment for this  condition often includes changing your lifestyle. Treatment may include some or all of the following:  Dietary changes. This may include developing a healthy meal plan.  Regular physical activity. This may include activity that causes your heart to beat faster (aerobic exercise) and strength training. Work with your health care provider to design an exercise program that works  for you.  Medicine to help you lose weight if you are unable to lose 1 pound a week after 6 weeks of healthy eating and more physical activity.  Treating conditions that cause the obesity (underlying conditions).  Surgery. Surgical options may include gastric banding and gastric bypass. Surgery may be done if: ? Other treatments have not helped to improve your condition. ? You have a BMI of 40 or higher. ? You have life-threatening health problems related to obesity. Follow these instructions at home: Eating and drinking  Follow recommendations from your health care provider about what you eat and drink. Your health care provider may advise you to: ? Limit fast food, sweets, and processed snack foods. ? Choose low-fat options, such as low-fat milk instead of whole milk. ? Eat 5 or more servings of fruits or vegetables every day. ? Eat at home more often. This gives you more control over what you eat. ? Choose healthy foods when you eat out. ? Learn to read food labels. This will help you understand how much food is considered 1 serving. ? Learn what a healthy serving size is. ? Keep low-fat snacks available. ? Limit sugary drinks, such as soda, fruit juice, sweetened iced tea, and flavored milk.  Drink enough water to keep your urine pale yellow.  Do not follow a fad diet. Fad diets can be unhealthy and even dangerous.   Physical activity  Exercise regularly, as told by your health care provider. ? Most adults should get up to 150 minutes of moderate-intensity exercise every week. ? Ask your health care provider what types of exercise are safe for you and how often you should exercise.  Warm up and stretch before being active.  Cool down and stretch after being active.  Rest between periods of activity. Lifestyle  Work with your health care provider and a dietitian to set a weight-loss goal that is healthy and reasonable for you.  Limit your screen time.  Find ways to  reward yourself that do not involve food.  Do not drink alcohol if: ? Your health care provider tells you not to drink. ? You are pregnant, may be pregnant, or are planning to become pregnant.  If you drink alcohol: ? Limit how much you use to:  0-1 drink a day for women.  0-2 drinks a day for men. ? Be aware of how much alcohol is in your drink. In the [place] one drink equals one 12 oz bottle of beer (355 mL), one 5 oz glass of wine (148 mL), or one 1 oz glass of hard liquor (44 mL). General instructions  Keep a weight-loss journal to keep track of the food you eat and how much exercise you get.  Take over-the-counter and prescription medicines only as told by your health care provider.  Take vitamins and supplements only as told by your health care provider.  Consider joining a support group. Your health care provider may be able to recommend a support group.  Keep all follow-up visits as told by your health care provider. This is important. Contact a health care provider if:  You are unable to meet  your weight loss goal after 6 weeks of dietary and lifestyle changes. Get help right away if you are having:  Trouble breathing.  Suicidal thoughts or behaviors. Summary  Obesity is the condition of having too much total body fat.  Being overweight or obese means that your weight is greater than what is considered healthy for your body size.  Work with your health care provider and a dietitian to set a weight-loss goal that is healthy and reasonable for you.  Exercise regularly, as told by your health care provider. Ask your health care provider what types of exercise are safe for you and how often you should exercise. This information is not intended to replace advice given to you by your health care provider. Make sure you discuss any questions you have with your health care provider. Document Revised: 04/19/2018 Document Reviewed: 04/19/2018 Elsevier Patient Education   2021 [employer].

## 2020-09-10 NOTE — Progress Notes (Signed)
I,Tianna Badgett,acting as a Neurosurgeon for [employer], NP.,have documented all relevant documentation on the behalf of [employer], NP,as directed by  Charlesetta Ivory, NP while in the presence of Charlesetta Ivory, NP.  This visit occurred during the SARS-CoV-2 public health emergency.  Safety protocols were in place, including screening questions prior to the visit, additional usage of staff PPE, and extensive cleaning of exam room while observing appropriate contact time as indicated for disinfecting solutions.  Subjective:     Patient ID: Ashley Harrison , female    DOB: February 03, 1971 , 50 y.o.   MRN: [id number]   Chief Complaint  Patient presents with  . Weight Check    HPI  Patient is here today for weight and insulin resistance. She has been taking Rybelsus with no complications. She admits to not exercising and drinking water like she should. At this time she would like to increase her dose of rybelsus.   Wt Readings from Last 3 Encounters: 09/10/20 : (!) 310 lb (140.6 kg) 07/13/20 : (!) 322 lb 9.6 oz (146.3 kg) 10/02/19 : (!) 306 lb 12.8 oz (139.2 kg)  Diet: Trying to eat more vegetables and fruits.  Exercise: Not really.      Past Medical History:  Diagnosis Date  . Hypertension   . Iron deficiency anemia due to chronic blood loss 10/31/2017  . Iron deficiency anemia, unspecified   . Menometrorrhagia 10/31/2017  . Obesity      Family History  Problem Relation Age of Onset  . Diabetes Mother   . Hyperlipidemia Father   . Hypertension Father      Current Outpatient Medications:  .  Semaglutide (RYBELSUS) 7 MG TABS, Take 7 mg by mouth daily., Disp: 60 tablet, Rfl: 1 .  telmisartan-hydrochlorothiazide (MICARDIS HCT) 80-25 MG tablet, Take 1 tablet by mouth once daily, Disp: 90 tablet, Rfl: 0 .  Vitamin D, Ergocalciferol, (DRISDOL) 1.25 MG (50000 UT) CAPS capsule, Take 1 capsule (50,000 Units total) by mouth 2 (two) times a week., Disp: 24 capsule, Rfl: 1    Allergies  Allergen Reactions  . Shellfish Allergy Hives     Review of Systems  Constitutional: Negative.   Respiratory: Negative.  Negative for shortness of breath and wheezing.   Cardiovascular: Negative.  Negative for chest pain.  Gastrointestinal: Negative.  Negative for constipation, diarrhea and nausea.  Endocrine: Negative for polydipsia, polyphagia and polyuria.  Neurological: Negative.      Today's Vitals   09/10/20 0907  BP: 128/68  Pulse: 87  Temp: 98.2 F (36.8 C)  TempSrc: Oral  Weight: (!) 310 lb (140.6 kg)  Height: 5' 2.2" (1.58 m)   Body mass index is 56.34 kg/m.  Wt Readings from Last 3 Encounters:  09/10/20 (!) 310 lb (140.6 kg)  07/13/20 (!) 322 lb 9.6 oz (146.3 kg)  10/02/19 (!) 306 lb 12.8 oz (139.2 kg)    Objective:  Physical Exam Constitutional:      Appearance: Normal appearance. She is obese.  HENT:     Head: Normocephalic and atraumatic.  Cardiovascular:     Rate and Rhythm: Normal rate and regular rhythm.     Pulses: Normal pulses.     Heart sounds: Normal heart sounds.  Pulmonary:     Effort: Pulmonary effort is normal. No respiratory distress.     Breath sounds: Normal breath sounds. No wheezing.  Neurological:     Mental Status: She is alert.  Psychiatric:        Mood and Affect:  Mood normal.        Behavior: Behavior normal.         Assessment And Plan:     1. Insulin resistance -Patient tolerated Rybelsus 3mg  so will increased dose.  - Semaglutide (RYBELSUS) 7 MG TABS; Take 7 mg by mouth daily.  Dispense: 60 tablet; Refill: 1  2. Class 3 severe obesity due to excess calories with serious comorbidity and body mass index (BMI) of 50.0 to 59.9 in adult (HCC) - Semaglutide (RYBELSUS) 7 MG TABS; Take 7 mg by mouth daily.  Dispense: 60 tablet; Refill: 1  -Staying healthy and adopting a healthy lifestyle for your overall health is important. You should eat 7 or more servings of fruits and vegetables per day. You should drink  plenty of water to keep yourself hydrated and your kidneys healthy. This includes about 65-80+ fluid ounces of water. Limit your intake of animal fats especially for elevated cholesterol. Avoid highly processed food and limit your salt intake if you have hypertension. Avoid foods high in saturated/Trans fats. Along with a healthy diet it is also very important to maintain time for yourself to maintain a healthy mental health with low stress levels. You should get atleast 150 min of moderate intensity exercise weekly for a healthy heart. Along with eating right and exercising, aim for at least 7-9 hours of sleep daily.  Eat more whole grains which includes barley, wheat berries, oats, brown rice and whole wheat pasta. Use healthy plant oils which include olive, soy, corn, sunflower and peanut. Limit your caffeine and sugary drinks. Limit your intake of fast foods. Limit milk and dairy products to one or two daily servings.   Patient will follow up in 3 months.   She is encouraged to strive for BMI less than 30 to decrease cardiac risk. Advised to aim for at least 150 minutes of exercise per week.  Patient was given opportunity to ask questions. Patient verbalized understanding of the plan and was able to repeat key elements of the plan. All questions were answered to their satisfaction.  , NP   I, Charlesetta Ivory, NP, have reviewed all documentation for this visit. The documentation on 09/10/20 for the exam, diagnosis, procedures, and orders are all accurate and complete.  THE PATIENT IS ENCOURAGED TO PRACTICE SOCIAL DISTANCING DUE TO THE COVID-19 PANDEMIC.

## 2020-09-11 ENCOUNTER — Telehealth: Payer: Self-pay

## 2020-09-11 NOTE — Telephone Encounter (Signed)
PA COMPLETED FOR RYBELSUS

## 2020-10-01 ENCOUNTER — Other Ambulatory Visit: Payer: Self-pay

## 2020-10-01 NOTE — Telephone Encounter (Signed)
Prior authorization done for rybelsus.  Waiting on a response from the pt's insurance company.

## 2020-11-19 NOTE — Progress Notes (Signed)
This encounter was created in error - please disregard.

## 2020-12-04 ENCOUNTER — Other Ambulatory Visit: Payer: Self-pay

## 2020-12-04 NOTE — Telephone Encounter (Signed)
Prior auth done for rybelsus waiting on a response from the [employer].

## 2020-12-10 ENCOUNTER — Ambulatory Visit: Payer: 59 | Admitting: Internal Medicine

## 2021-01-12 ENCOUNTER — Ambulatory Visit: Payer: 59 | Admitting: Internal Medicine

## 2021-05-06 ENCOUNTER — Other Ambulatory Visit: Payer: Self-pay | Admitting: Internal Medicine

## 2021-05-28 ENCOUNTER — Encounter: Payer: Self-pay | Admitting: Internal Medicine

## 2021-06-29 LAB — HM MAMMOGRAPHY: HM Mammogram: NORMAL (ref 0–4)

## 2021-07-21 ENCOUNTER — Encounter: Payer: 59 | Admitting: Internal Medicine

## 2021-09-09 ENCOUNTER — Ambulatory Visit (INDEPENDENT_AMBULATORY_CARE_PROVIDER_SITE_OTHER): Payer: 59 | Admitting: Nurse Practitioner

## 2021-09-09 ENCOUNTER — Other Ambulatory Visit: Payer: Self-pay

## 2021-09-09 VITALS — BP 160/100 | HR 103 | Temp 98.6°F | Ht 62.8 in | Wt 339.6 lb

## 2021-09-09 DIAGNOSIS — I10 Essential (primary) hypertension: Secondary | ICD-10-CM

## 2021-09-09 DIAGNOSIS — Z23 Encounter for immunization: Secondary | ICD-10-CM | POA: Diagnosis not present

## 2021-09-09 DIAGNOSIS — R000 Tachycardia, unspecified: Secondary | ICD-10-CM

## 2021-09-09 DIAGNOSIS — Z1211 Encounter for screening for malignant neoplasm of colon: Secondary | ICD-10-CM

## 2021-09-09 DIAGNOSIS — Z0000 Encounter for general adult medical examination without abnormal findings: Secondary | ICD-10-CM | POA: Diagnosis not present

## 2021-09-09 DIAGNOSIS — M25511 Pain in right shoulder: Secondary | ICD-10-CM

## 2021-09-09 DIAGNOSIS — Z114 Encounter for screening for human immunodeficiency virus [HIV]: Secondary | ICD-10-CM

## 2021-09-09 DIAGNOSIS — R319 Hematuria, unspecified: Secondary | ICD-10-CM

## 2021-09-09 DIAGNOSIS — R0609 Other forms of dyspnea: Secondary | ICD-10-CM

## 2021-09-09 DIAGNOSIS — E559 Vitamin D deficiency, unspecified: Secondary | ICD-10-CM

## 2021-09-09 DIAGNOSIS — Z6841 Body Mass Index (BMI) 40.0 and over, adult: Secondary | ICD-10-CM

## 2021-09-09 DIAGNOSIS — Z79899 Other long term (current) drug therapy: Secondary | ICD-10-CM

## 2021-09-09 DIAGNOSIS — Z2821 Immunization not carried out because of patient refusal: Secondary | ICD-10-CM

## 2021-09-09 DIAGNOSIS — E8881 Metabolic syndrome: Secondary | ICD-10-CM | POA: Diagnosis not present

## 2021-09-09 LAB — POCT URINALYSIS DIPSTICK
Bilirubin, UA: NEGATIVE
Glucose, UA: NEGATIVE
Ketones, UA: NEGATIVE
Leukocytes, UA: NEGATIVE
Nitrite, UA: NEGATIVE
Protein, UA: POSITIVE — AB
Spec Grav, UA: 1.02 (ref 1.010–1.025)
Urobilinogen, UA: 2 E.U./dL — AB
pH, UA: 7.5 (ref 5.0–8.0)

## 2021-09-09 MED ORDER — ZOSTER VAC RECOMB ADJUVANTED 50 MCG/0.5ML IM SUSR: 0.5000 mL | Freq: Once | INTRAMUSCULAR | 0 refills | Status: AC

## 2021-09-09 MED ORDER — DICLOFENAC SODIUM 1 % EX GEL: 2.0000 g | Freq: Four times a day (QID) | CUTANEOUS | 2 refills | Status: DC

## 2021-09-09 MED ORDER — TELMISARTAN-HCTZ 80-25 MG PO TABS: 1.0000 | ORAL_TABLET | Freq: Every day | ORAL | 0 refills | Status: DC

## 2021-09-09 NOTE — Progress Notes (Signed)
I,Ashley Harrison,acting as a Education administrator for [employer], FNP.,have documented all relevant documentation on the behalf of Ashley Brine, FNP,as directed by  Ashley Brine, FNP while in the presence of Ashley Harrison, [place].  This visit occurred during the SARS-CoV-2 public health emergency.  Safety protocols were in place, including screening questions prior to the visit, additional usage of staff PPE, and extensive cleaning of exam room while observing appropriate contact time as indicated for disinfecting solutions.  Subjective:     Patient ID: Ashley Harrison , female    DOB: 1970-09-22 , 51 y.o.   MRN: [id number]   Chief Complaint  Patient presents with   Annual Exam    HPI  The patient is here today for a physical examination. She is followed by Dr. Diamond Nickel for her GYN exams.   Wt Readings from Last 3 Encounters: 09/09/21 : (!) 339 lb 9.6 oz (154 kg) 09/10/20 : (!) 310 lb (140.6 kg) 07/13/20 : (!) 322 lb 9.6 oz (146.3 kg)    Hypertension This is a chronic problem. The current episode started more than 1 year ago. The problem has been gradually worsening since onset. The problem is uncontrolled. Associated symptoms include shortness of breath. Pertinent negatives include no anxiety or peripheral edema. There are no associated agents to hypertension. Risk factors for coronary artery disease include obesity and sedentary lifestyle. Past treatments include angiotensin blockers and diuretics. There are no compliance problems.  There is no history of angina or kidney disease.    Past Medical History:  Diagnosis Date   Hypertension    Iron deficiency anemia due to chronic blood loss 10/31/2017   Iron deficiency anemia, unspecified    Menometrorrhagia 10/31/2017   Obesity      Family History  Problem Relation Age of Onset   Diabetes Mother    Hyperlipidemia Father    Hypertension Father      Current Outpatient Medications:    CALCIUM PO, Take 1 tablet by mouth daily at 2 am., Disp: ,  Rfl:    diclofenac Sodium (VOLTAREN) 1 % GEL, Apply 2 g topically 4 (four) times daily., Disp: 100 g, Rfl: 2   Vitamin D, Ergocalciferol, (DRISDOL) 1.25 MG (50000 UT) CAPS capsule, Take 1 capsule (50,000 Units total) by mouth 2 (two) times a week., Disp: 24 capsule, Rfl: 1   Semaglutide (RYBELSUS) 7 MG TABS, Take 7 mg by mouth daily., Disp: 30 tablet, Rfl: 1   telmisartan-hydrochlorothiazide (MICARDIS HCT) 80-25 MG tablet, Take 1 tablet by mouth daily., Disp: 90 tablet, Rfl: 0   Allergies  Allergen Reactions   Shellfish Allergy Hives      The patient states she had an   ablation .  No LMP recorded. Patient has had an ablation.. followed by Dr. Terri Piedra, she had a gynecology follow up in November, no PAP next due in 3-5 years. Negative for Dysmenorrhea and Negative for Menorrhagia. Negative for: breast discharge, breast lump(s), breast pain and breast self exam. Associated symptoms include abnormal vaginal bleeding. Pertinent negatives include abnormal bleeding (hematology), anxiety, decreased libido, depression, difficulty falling sleep, dyspareunia, history of infertility, nocturia, sexual dysfunction, sleep disturbances, urinary incontinence, urinary urgency, vaginal discharge and vaginal itching. Diet regular; eats good amount of fast food.  Works 3 days a week from home and sits a lot The patient states her exercise level is moderate - 2-3 times a week since September.   Wt Readings from Last 3 Encounters:  09/09/21 (!) 339 lb 9.6 oz (154 kg)  09/10/20 (!) 310 lb (140.6 kg)  07/13/20 (!) 322 lb 9.6 oz (146.3 kg)     . The patient's tobacco use is:  Social History   Tobacco Use  Smoking Status Never  Smokeless Tobacco Never   She has been exposed to passive smoke. The patient's alcohol use is:  Social History   Substance and Sexual Activity  Alcohol Use No  . Additional information: Last pap 10/10/2018 Dr Terri Piedra, next one scheduled for 2023-2025 per patient.    Review of Systems   Constitutional: Negative.   HENT: Negative.    Eyes: Negative.   Respiratory:  Positive for shortness of breath.   Cardiovascular: Negative.   Gastrointestinal: Negative.   Endocrine: Negative.   Genitourinary: Negative.   Musculoskeletal: Negative.   Skin: Negative.   Allergic/Immunologic: Negative.   Neurological: Negative.   Hematological: Negative.   Psychiatric/Behavioral: Negative.      Today's Vitals   09/09/21 1208  BP: (!) 160/100  Pulse: (!) 103  Temp: 98.6 F (37 C)  Weight: (!) 339 lb 9.6 oz (154 kg)  Height: 5' 2.8" (1.595 m)  PainSc: 0-No pain   Body mass index is 60.54 kg/m.   Objective:  Physical Exam Constitutional:      General: She is not in acute distress.    Appearance: Normal appearance. She is well-developed. She is obese.  HENT:     Head: Normocephalic and atraumatic.     Right Ear: Hearing, tympanic membrane, ear canal and external ear normal. There is no impacted cerumen.     Left Ear: Hearing, tympanic membrane, ear canal and external ear normal. There is no impacted cerumen.     Nose:     Comments: Deferred - masked    Mouth/Throat:     Comments: Deferred - masked Eyes:     General: Lids are normal.     Extraocular Movements: Extraocular movements intact.     Conjunctiva/sclera: Conjunctivae normal.     Pupils: Pupils are equal, round, and reactive to light.     Funduscopic exam:    Right eye: No papilledema.        Left eye: No papilledema.  Neck:     Thyroid: No thyroid mass.     Vascular: No carotid bruit.  Cardiovascular:     Rate and Rhythm: Normal rate and regular rhythm.     Pulses: Normal pulses.     Heart sounds: Normal heart sounds. No murmur heard. Pulmonary:     Effort: Pulmonary effort is normal. No respiratory distress.     Breath sounds: Normal breath sounds. No wheezing.  Chest:     Chest wall: No mass.  Breasts:    Tanner Score is 5.     Right: Normal. No mass or tenderness.     Left: Normal. No mass or  tenderness.  Abdominal:     General: Abdomen is flat. Bowel sounds are normal. There is no distension.     Palpations: Abdomen is soft.     Tenderness: There is no abdominal tenderness.  Genitourinary:    Comments: Deferred - followed by Dr. Terri Piedra Musculoskeletal:        General: No swelling or tenderness. Normal range of motion.     Cervical back: Full passive range of motion without pain, normal range of motion and neck supple.     Right lower leg: No edema.     Left lower leg: No edema.  Lymphadenopathy:     Upper Body:  Right upper body: No supraclavicular, axillary or pectoral adenopathy.     Left upper body: No supraclavicular, axillary or pectoral adenopathy.  Skin:    General: Skin is warm and dry.     Capillary Refill: Capillary refill takes less than 2 seconds.  Neurological:     General: No focal deficit present.     Mental Status: She is alert and oriented to person, place, and time.     Cranial Nerves: No cranial nerve deficit.     Sensory: No sensory deficit.     Motor: No weakness.  Psychiatric:        Mood and Affect: Mood normal.        Behavior: Behavior normal.        Thought Content: Thought content normal.        Judgment: Judgment normal.        Assessment And Plan:     1. Encounter for general adult medical examination w/o abnormal findings Behavior modifications discussed and diet history reviewed.   Pt will continue to exercise regularly and modify diet with low GI, plant based foods and decrease intake of processed foods.  Recommend intake of daily multivitamin, Vitamin D, and calcium.  Recommend mammogram (will need to get records from Dr. Terri Piedra) and colonoscopy for preventive screenings, as well as recommend immunizations that include influenza, TDAP, and Shingles  2. Encounter for screening for human immunodeficiency virus (HIV)  3. Screening for colon cancer According to USPTF Colorectal cancer Screening guidelines. Colonoscopy is  recommended every 10 years, starting at age 82 years. Will refer to GI for colon cancer screening - Ambulatory referral to Gastroenterology  4. Class 3 severe obesity due to excess calories with body mass index (BMI) of 60.0 to 69.9 in adult, unspecified whether serious comorbidity present (HCC) Chronic Discussed healthy diet and regular exercise options  Encouraged to exercise at least 150 minutes per week with 2 days of strength training  5. Encounter for long-term current use of medication - CBC  6. Immunization due Will give tetanus vaccine today while in office. Refer to order management. TDAP will be administered to adults 92-40 years old every 10 years. - Zoster Vaccine Adjuvanted [facility]) injection; Inject 0.5 mLs into the muscle once for 1 dose.  Dispense: 0.5 mL; Refill: 0 - Tdap vaccine greater than or equal to 7yo IM  7. Influenza vaccination declined Patient declined influenza vaccination at this time. Patient is aware that influenza vaccine prevents illness in 70% of healthy people, and reduces hospitalizations to 30-70% in elderly. This vaccine is recommended annually. Pt is willing to accept risk associated with refusing vaccination.  8. Insulin resistance Comments: She had been given samples of rybelsus, had not been taking.  - Hemoglobin A1c - TSH - Semaglutide (RYBELSUS) 7 MG TABS; Take 7 mg by mouth daily.  Dispense: 30 tablet; Refill: 1  9. Essential hypertension, benign Comments: Blood pressure is uncontrolled, she is advised to be sure to take her medications as directed.  - POCT Urinalysis Dipstick (81002) - Microalbumin / Creatinine Urine Ratio - EKG 12-Lead - CMP14+EGFR  10. Tachycardia Comments: HR is 102 with EKG, will check BNP as well due to having dyspnea on exertion - Lipid panel - Brain natriuretic peptide  11. Vitamin D deficiency Will check vitamin D level and supplement as needed.    Also encouraged to spend 15 minutes in the sun daily.   - VITAMIN D 25 Hydroxy (Vit-D Deficiency, Fractures)  12. Acute pain of  right shoulder Comments: Range of motion is slightly limited will treat with diclofenac gel due to neds to avoid ibuprofen due to elevated blood pressure. - diclofenac Sodium (VOLTAREN) 1 % GEL; Apply 2 g topically 4 (four) times daily.  Dispense: 100 g; Refill: 2  13. Dyspnea on exertion - Brain natriuretic peptide  14. Hematuria, unspecified type Comments: Urinalysis showed large blood and positive protein,blood pressure is elevated today. Will send urine culture - Urine Culture    Patient was given opportunity to ask questions. Patient verbalized understanding of the plan and was able to repeat key elements of the plan. All questions were answered to their satisfaction.   Ashley Brine, FNP    I, Ashley Brine, FNP, have reviewed all documentation for this visit. The documentation on 09/09/21 for the exam, diagnosis, procedures, and orders are all accurate and complete.   THE PATIENT IS ENCOURAGED TO PRACTICE SOCIAL DISTANCING DUE TO THE COVID-19 PANDEMIC.

## 2021-09-09 NOTE — Patient Instructions (Addendum)
Health Maintenance, Female °Adopting a healthy lifestyle and getting preventive care are important in promoting health and wellness. Ask your health care provider about: °The right schedule for you to have regular tests and exams. °Things you can do on your own to prevent diseases and keep yourself healthy. °What should I know about diet, weight, and exercise? °Eat a healthy diet ° °Eat a diet that includes plenty of vegetables, fruits, low-fat dairy products, and lean protein. °Do not eat a lot of foods that are high in solid fats, added sugars, or sodium. °Maintain a healthy weight °Body mass index (BMI) is used to identify weight problems. It estimates body fat based on height and weight. Your health care provider can help determine your BMI and help you achieve or maintain a healthy weight. °Get regular exercise °Get regular exercise. This is one of the most important things you can do for your health. Most adults should: °Exercise for at least 150 minutes each week. The exercise should increase your heart rate and make you sweat (moderate-intensity exercise). °Do strengthening exercises at least twice a week. This is in addition to the moderate-intensity exercise. °Spend less time sitting. Even light physical activity can be beneficial. °Watch cholesterol and blood lipids °Have your blood tested for lipids and cholesterol at 53 years of age, then have this test every 5 years. °Have your cholesterol levels checked more often if: °Your lipid or cholesterol levels are high. °You are older than 53 years of age. °You are at high risk for heart disease. °What should I know about cancer screening? °Depending on your health history and family history, you may need to have cancer screening at various ages. This may include screening for: °Breast cancer. °Cervical cancer. °Colorectal cancer. °Skin cancer. °Lung cancer. °What should I know about heart disease, diabetes, and high blood pressure? °Blood pressure and heart  disease °High blood pressure causes heart disease and increases the risk of stroke. This is more likely to develop in people who have high blood pressure readings or are overweight. °Have your blood pressure checked: °Every 3-5 years if you are 18-39 years of age. °Every year if you are 40 years old or older. °Diabetes °Have regular diabetes screenings. This checks your fasting blood sugar level. Have the screening done: °Once every three years after age 40 if you are at a normal weight and have a low risk for diabetes. °More often and at a younger age if you are overweight or have a high risk for diabetes. °What should I know about preventing infection? °Hepatitis B °If you have a higher risk for hepatitis B, you should be screened for this virus. Talk with your health care provider to find out if you are at risk for hepatitis B infection. °Hepatitis C °Testing is recommended for: °Everyone born from 1945 through 1965. °Anyone with known risk factors for hepatitis C. °Sexually transmitted infections (STIs) °Get screened for STIs, including gonorrhea and chlamydia, if: °You are sexually active and are younger than 53 years of age. °You are older than 53 years of age and your health care provider tells you that you are at risk for this type of infection. °Your sexual activity has changed since you were last screened, and you are at increased risk for chlamydia or gonorrhea. Ask your health care provider if you are at risk. °Ask your health care provider about whether you are at high risk for HIV. Your health care provider may recommend a prescription medicine to help prevent HIV   infection. If you choose to take medicine to prevent HIV, you should first get tested for HIV. You should then be tested every 3 months for as long as you are taking the medicine. °Pregnancy °If you are about to stop having your period (premenopausal) and you may become pregnant, seek counseling before you get pregnant. °Take 400 to 800  micrograms (mcg) of folic acid every day if you become pregnant. °Ask for birth control (contraception) if you want to prevent pregnancy. °Osteoporosis and menopause °Osteoporosis is a disease in which the bones lose minerals and strength with aging. This can result in bone fractures. If you are 65 years old or older, or if you are at risk for osteoporosis and fractures, ask your health care provider if you should: °Be screened for bone loss. °Take a calcium or vitamin D supplement to lower your risk of fractures. °Be given hormone replacement therapy (HRT) to treat symptoms of menopause. °Follow these instructions at home: °Alcohol use °Do not drink alcohol if: °Your health care provider tells you not to drink. °You are pregnant, may be pregnant, or are planning to become pregnant. °If you drink alcohol: °Limit how much you have to: °0-1 drink a day. °Know how much alcohol is in your drink. In the [place] one drink equals one 12 oz bottle of beer (355 mL), one 5 oz glass of wine (148 mL), or one 1½ oz glass of hard liquor (44 mL). °Lifestyle °Do not use any products that contain nicotine or tobacco. These products include cigarettes, chewing tobacco, and vaping devices, such as e-cigarettes. If you need help quitting, ask your health care provider. °Do not use street drugs. °Do not share needles. °Ask your health care provider for help if you need support or information about quitting drugs. °General instructions °Schedule regular health, dental, and eye exams. °Stay current with your vaccines. °Tell your health care provider if: °You often feel depressed. °You have ever been abused or do not feel safe at home. °Summary °Adopting a healthy lifestyle and getting preventive care are important in promoting health and wellness. °Follow your health care provider's instructions about healthy diet, exercising, and getting tested or screened for diseases. °Follow your health care provider's instructions on monitoring your  cholesterol and blood pressure. °This information is not intended to replace advice given to you by your health care provider. Make sure you discuss any questions you have with your health care provider. °Document Revised: 01/04/2021 Document Reviewed: 01/04/2021 °Elsevier Patient Education © 2022 [employer] ° ° °Zoster Vaccine, Recombinant injection °What is this medication? °ZOSTER VACCINE (ZOS ter vak SEEN) is a vaccine used to reduce the risk of getting shingles. This vaccine is not used to treat shingles or nerve pain from shingles. °This medicine may be used for other purposes; ask your health care provider or pharmacist if you have questions. °COMMON BRAND NAME(S): SHINGRIX °What should I tell my care team before I take this medication? °They need to know if you have any of these conditions: °cancer °immune system problems °an unusual or allergic reaction to Zoster vaccine, other medications, foods, dyes, or preservatives °pregnant or trying to get pregnant °breast-feeding °How should I use this medication? °This vaccine is injected into a muscle. It is given by a health care provider. °A copy of Vaccine Information Statements will be given before each vaccination. Be sure to read this information carefully each time. This sheet may change often. °Talk to your health care provider about the use of this   vaccine in children. This vaccine is not approved for use in children. °Overdosage: If you think you have taken too much of this medicine contact a poison control center or emergency room at once. °NOTE: This medicine is only for you. Do not share this medicine with others. °What if I miss a dose? °Keep appointments for follow-up (booster) doses. It is important not to miss your dose. Call your health care provider if you are unable to keep an appointment. °What may interact with this medication? °medicines that suppress your immune system °medicines to treat cancer °steroid medicines like prednisone or  cortisone °This list may not describe all possible interactions. Give your health care provider a list of all the medicines, herbs, non-prescription drugs, or dietary supplements you use. Also tell them if you smoke, drink alcohol, or use illegal drugs. Some items may interact with your medicine. °What should I watch for while using this medication? °Visit your health care provider regularly. °This vaccine, like all vaccines, may not fully protect everyone. °What side effects may I notice from receiving this medication? °Side effects that you should report to your doctor or health care professional as soon as possible: °allergic reactions (skin rash, itching or hives; swelling of the face, lips, or tongue) °trouble breathing °Side effects that usually do not require medical attention (report these to your doctor or health care professional if they continue or are bothersome): °chills °headache °fever °nausea °pain, redness, or irritation at site where injected °tiredness °vomiting °This list may not describe all possible side effects. Call your doctor for medical advice about side effects. You may report side effects to FDA at 1-800-FDA-1088. °Where should I keep my medication? °This vaccine is only given by a health care provider. It will not be stored at home. °NOTE: This sheet is a summary. It may not cover all possible information. If you have questions about this medicine, talk to your doctor, pharmacist, or health care provider. °© 2022 Elsevier/Gold Standard (2021-05-04 00:00:00) ° °

## 2021-09-10 LAB — CBC
Hematocrit: 38 % (ref 34.0–46.6)
Hemoglobin: 12 g/dL (ref 11.1–15.9)
MCH: 24.4 pg — ABNORMAL LOW (ref 26.6–33.0)
MCHC: 31.6 g/dL (ref 31.5–35.7)
MCV: 77 fL — ABNORMAL LOW (ref 79–97)
Platelets: 338 10*3/uL (ref 150–450)
RBC: 4.91 x10E6/uL (ref 3.77–5.28)
RDW: 14.6 % (ref 11.7–15.4)
WBC: 8.6 10*3/uL (ref 3.4–10.8)

## 2021-09-10 LAB — LIPID PANEL
Chol/HDL Ratio: 2.8 ratio (ref 0.0–4.4)
Cholesterol, Total: 187 mg/dL (ref 100–199)
HDL: 67 mg/dL (ref >39)
LDL Chol Calc (NIH): 103 mg/dL — ABNORMAL HIGH (ref 0–99)
Triglycerides: 93 mg/dL (ref 0–149)
VLDL Cholesterol Cal: 17 mg/dL (ref 5–40)

## 2021-09-10 LAB — CMP14+EGFR
ALT: 16 IU/L (ref 0–32)
AST: 16 IU/L (ref 0–40)
Albumin/Globulin Ratio: 1.5 (ref 1.2–2.2)
Albumin: 4.1 g/dL (ref 3.8–4.9)
Alkaline Phosphatase: 122 IU/L — ABNORMAL HIGH (ref 44–121)
BUN/Creatinine Ratio: 14 (ref 9–23)
BUN: 10 mg/dL (ref 6–24)
Bilirubin Total: 0.3 mg/dL (ref 0.0–1.2)
CO2: 24 mmol/L (ref 20–29)
Calcium: 9.6 mg/dL (ref 8.7–10.2)
Chloride: 103 mmol/L (ref 96–106)
Creatinine, Ser: 0.74 mg/dL (ref 0.57–1.00)
Globulin, Total: 2.7 g/dL (ref 1.5–4.5)
Glucose: 119 mg/dL — ABNORMAL HIGH (ref 70–99)
Potassium: 4.5 mmol/L (ref 3.5–5.2)
Sodium: 142 mmol/L (ref 134–144)
Total Protein: 6.8 g/dL (ref 6.0–8.5)
eGFR: 97 mL/min/{1.73_m2} (ref >59)

## 2021-09-10 LAB — MICROALBUMIN / CREATININE URINE RATIO
Creatinine, Urine: 203.3 mg/dL
Microalb/Creat Ratio: 24 mg/g creat (ref 0–29)
Microalbumin, Urine: 49.2 ug/mL

## 2021-09-10 LAB — HEMOGLOBIN A1C
Est. average glucose Bld gHb Est-mCnc: 146 mg/dL
Hgb A1c MFr Bld: 6.7 % — ABNORMAL HIGH (ref 4.8–5.6)

## 2021-09-10 LAB — TSH: TSH: 0.761 u[IU]/mL (ref 0.450–4.500)

## 2021-09-10 LAB — VITAMIN D 25 HYDROXY (VIT D DEFICIENCY, FRACTURES): Vit D, 25-Hydroxy: 18.8 ng/mL — ABNORMAL LOW (ref 30.0–100.0)

## 2021-09-11 LAB — URINE CULTURE: Organism ID, Bacteria: NO GROWTH

## 2021-09-26 ENCOUNTER — Encounter: Payer: Self-pay | Admitting: Nurse Practitioner

## 2021-09-26 MED ORDER — RYBELSUS 7 MG PO TABS: 7.0000 mg | ORAL_TABLET | Freq: Every day | ORAL | 1 refills | Status: DC

## 2021-12-13 ENCOUNTER — Ambulatory Visit: Payer: 59 | Admitting: Internal Medicine

## 2021-12-13 ENCOUNTER — Encounter: Payer: Self-pay | Admitting: Internal Medicine

## 2021-12-13 VITALS — BP 124/70 | HR 104 | Temp 98.1°F | Ht 62.8 in | Wt 337.6 lb

## 2021-12-13 DIAGNOSIS — D500 Iron deficiency anemia secondary to blood loss (chronic): Secondary | ICD-10-CM

## 2021-12-13 DIAGNOSIS — E8881 Metabolic syndrome: Secondary | ICD-10-CM | POA: Diagnosis not present

## 2021-12-13 DIAGNOSIS — Z23 Encounter for immunization: Secondary | ICD-10-CM

## 2021-12-13 DIAGNOSIS — Z6841 Body Mass Index (BMI) 40.0 and over, adult: Secondary | ICD-10-CM

## 2021-12-13 MED ORDER — RYBELSUS 3 MG PO TABS: 3.0000 mg | ORAL_TABLET | Freq: Every day | ORAL | 3 refills | Status: DC

## 2021-12-13 NOTE — Progress Notes (Signed)
?Jeri Cos Llittleton,acting as a Neurosurgeon for [name], MD.,have documented all relevant documentation on the behalf of [name], MD,as directed by  [name], MD while in the presence of [name], MD.  ?This visit occurred during the SARS-CoV-2 public health emergency.  Safety protocols were in place, including screening questions prior to the visit, additional usage of staff PPE, and extensive cleaning of exam room while observing appropriate contact time as indicated for disinfecting solutions. ? ?Subjective:  ?  ? Patient ID: Ashley Harrison , female    DOB: 1971-01-24 , 51 y.o.   MRN: [id number] ? ? ?Chief Complaint  ?Patient presents with  ? Hypertension  ? Prediabetes  ? ? ?HPI ? ?Patient is here today for insulin resistance and HTN f/u. She is not taking any  medication to address prediabetes. She had taken Rybelsus in the past; however, did not tolerate 7mg  dose.  ? ?Hypertension ?This is a chronic problem. The current episode started more than 1 year ago. The problem has been gradually improving since onset. The problem is controlled. Pertinent negatives include no chest pain or headaches. Risk factors for coronary artery disease include sedentary lifestyle and obesity. The current treatment provides moderate improvement.   ? ?Past Medical History:  ?Diagnosis Date  ? Hypertension   ? Iron deficiency anemia due to chronic blood loss 10/31/2017  ? Iron deficiency anemia, unspecified   ? Menometrorrhagia 10/31/2017  ? Obesity   ?  ? ?Family History  ?Problem Relation Age of Onset  ? Diabetes Mother   ? Hyperlipidemia Father   ? Hypertension Father   ? ? ? ?Current Outpatient Medications:  ?  CALCIUM PO, Take 1 tablet by mouth daily at 2 am., Disp: , Rfl:  ?  diclofenac Sodium (VOLTAREN) 1 % GEL, Apply 2 g topically 4 (four) times daily., Disp: 100 g, Rfl: 2 ?  Semaglutide (RYBELSUS) 3 MG TABS, Take 3 mg by mouth daily., Disp: 30 tablet, Rfl: 3 ?  telmisartan-hydrochlorothiazide  (MICARDIS HCT) 80-25 MG tablet, Take 1 tablet by mouth daily., Disp: 90 tablet, Rfl: 0 ?  Vitamin D, Ergocalciferol, (DRISDOL) 1.25 MG (50000 UT) CAPS capsule, Take 1 capsule (50,000 Units total) by mouth 2 (two) times a week., Disp: 24 capsule, Rfl: 1  ? ?Allergies  ?Allergen Reactions  ? Shellfish Allergy Hives  ?  ? ?Review of Systems  ?Constitutional: Negative.   ?Respiratory: Negative.    ?Cardiovascular: Negative.  Negative for chest pain.  ?Gastrointestinal: Negative.   ?Neurological: Negative.  Negative for headaches.  ?Psychiatric/Behavioral: Negative.     ? ?Today's Vitals  ? 12/13/21 1546  ?BP: 124/70  ?Pulse: (!) 104  ?Temp: 98.1 ?F (36.7 ?C)  ?Weight: (!) 337 lb 9.6 oz (153.1 kg)  ?Height: 5' 2.8" (1.595 m)  ?PainSc: 0-No pain  ? ?Body mass index is 60.18 kg/m?.  ?Wt Readings from Last 3 Encounters:  ?12/13/21 (!) 337 lb 9.6 oz (153.1 kg)  ?09/09/21 (!) 339 lb 9.6 oz (154 kg)  ?09/10/20 (!) 310 lb (140.6 kg)  ?  ? ?Objective:  ?Physical Exam ?Vitals and nursing note reviewed.  ?Constitutional:   ?   Appearance: Normal appearance.  ?HENT:  ?   Head: Normocephalic and atraumatic.  ?Eyes:  ?   Extraocular Movements: Extraocular movements intact.  ?Cardiovascular:  ?   Rate and Rhythm: Normal rate and regular rhythm.  ?   Heart sounds: Normal heart sounds.  ?Pulmonary:  ?   Effort:  Pulmonary effort is normal.  ?   Breath sounds: Normal breath sounds.  ?Skin: ?   General: Skin is warm.  ?Neurological:  ?   General: No focal deficit present.  ?   Mental Status: She is alert.  ?Psychiatric:     ?   Mood and Affect: Mood normal.     ?   Behavior: Behavior normal.  ?  ? ?   ?Assessment And Plan:  ?   ?1. Insulin resistance ?Comments: Her last a1c was 6.7 in Jan 2023. I will recheck today. She agrees to consider Rybelsus again. She was given 3mg  samples. ?- Hemoglobin A1c ?- Insulin, random(561) ? ?2. Iron deficiency anemia due to chronic blood loss ?Comments: Chronic.  I will check CBC and iron levels. She has  been seen by Hematology in the past. If levels are low again, I will refer her back to Hematology.  ?- CBC no Diff ?- Iron, TIBC and Ferritin Panel ? ?3. Class 3 severe obesity due to excess calories with serious comorbidity and body mass index (BMI) of 60.0 to 69.9 in adult [facility]) ?Comments: She has gained 29 lbs since Jan 2022. She is encouraged to incorporate more exercise into her daily routine, aiming for at least 150 minutes of exercise/week.  ? ?4. Immunization due ?  ?Patient was given opportunity to ask questions. Patient verbalized understanding of the plan and was able to repeat key elements of the plan. All questions were answered to their satisfaction.  ? ?I, Feb 2022, MD, have reviewed all documentation for this visit. The documentation on 12/13/21 for the exam, diagnosis, procedures, and orders are all accurate and complete.  ? ?IF YOU HAVE BEEN REFERRED TO A SPECIALIST, IT MAY TAKE 1-2 WEEKS TO SCHEDULE/PROCESS THE REFERRAL. IF YOU HAVE NOT HEARD FROM US/SPECIALIST IN TWO WEEKS, PLEASE GIVE 12/15/21 A CALL AT [phone] X [phone].  ? ?THE PATIENT IS ENCOURAGED TO PRACTICE SOCIAL DISTANCING DUE TO THE COVID-19 PANDEMIC.   ?

## 2021-12-13 NOTE — Patient Instructions (Signed)

## 2021-12-14 LAB — CBC
Hematocrit: 38.2 % (ref 34.0–46.6)
Hemoglobin: 11.6 g/dL (ref 11.1–15.9)
MCH: 23.8 pg — ABNORMAL LOW (ref 26.6–33.0)
MCHC: 30.4 g/dL — ABNORMAL LOW (ref 31.5–35.7)
MCV: 78 fL — ABNORMAL LOW (ref 79–97)
Platelets: 379 10*3/uL (ref 150–450)
RBC: 4.88 x10E6/uL (ref 3.77–5.28)
RDW: 14.9 % (ref 11.7–15.4)
WBC: 9.2 10*3/uL (ref 3.4–10.8)

## 2021-12-14 LAB — IRON,TIBC AND FERRITIN PANEL
Ferritin: 164 ng/mL — ABNORMAL HIGH (ref 15–150)
Iron Saturation: 8 % — CL (ref 15–55)
Iron: 30 ug/dL (ref 27–159)
Total Iron Binding Capacity: 369 ug/dL (ref 250–450)
UIBC: 339 ug/dL (ref 131–425)

## 2021-12-14 LAB — HEMOGLOBIN A1C
Est. average glucose Bld gHb Est-mCnc: 151 mg/dL
Hgb A1c MFr Bld: 6.9 % — ABNORMAL HIGH (ref 4.8–5.6)

## 2021-12-14 LAB — INSULIN, RANDOM: INSULIN: 146 u[IU]/mL — ABNORMAL HIGH (ref 2.6–24.9)

## 2021-12-18 ENCOUNTER — Other Ambulatory Visit: Payer: Self-pay | Admitting: Internal Medicine

## 2021-12-21 ENCOUNTER — Ambulatory Visit: Payer: 59

## 2021-12-28 ENCOUNTER — Ambulatory Visit (INDEPENDENT_AMBULATORY_CARE_PROVIDER_SITE_OTHER): Payer: 59

## 2021-12-28 VITALS — BP 118/64 | HR 91 | Temp 98.2°F | Ht 62.0 in | Wt 337.0 lb

## 2021-12-28 DIAGNOSIS — Z23 Encounter for immunization: Secondary | ICD-10-CM

## 2021-12-28 NOTE — Progress Notes (Signed)
?  I,Rashawn Rayman,acting as a Neurosurgeon for [employer] documented all relevant documentation on the behalf of TIMA-NURSE,as directed by  [facility] while in the presence of TIMA-NURSE. ? ?This visit occurred during the SARS-CoV-2 public health emergency.  Safety protocols were in place, including screening questions prior to the visit, additional usage of staff PPE, and extensive cleaning of exam room while observing appropriate contact time as indicated for disinfecting solutions. ? ?Subjective:  ?  ? Patient ID: Ashley Harrison , female    DOB: 06/20/1971 , 51 y.o.   MRN: [id number] ? ? ?Chief Complaint  ?Patient presents with  ?? Immunizations  ? ? ?HPI ? ?Patient presents for shingles vaccine.  ?  ? ?Past Medical History:  ?Diagnosis Date  ?? Hypertension   ?? Iron deficiency anemia due to chronic blood loss 10/31/2017  ?? Iron deficiency anemia, unspecified   ?? Menometrorrhagia 10/31/2017  ?? Obesity   ?  ? ?Family History  ?Problem Relation Age of Onset  ?? Diabetes Mother   ?? Hyperlipidemia Father   ?? Hypertension Father   ? ? ? ?Current Outpatient Medications:  ??  CALCIUM PO, Take 1 tablet by mouth daily at 2 am., Disp: , Rfl:  ??  diclofenac Sodium (VOLTAREN) 1 % GEL, Apply 2 g topically 4 (four) times daily., Disp: 100 g, Rfl: 2 ??  Semaglutide (RYBELSUS) 3 MG TABS, Take 3 mg by mouth daily., Disp: 30 tablet, Rfl: 3 ??  telmisartan-hydrochlorothiazide (MICARDIS HCT) 80-25 MG tablet, Take 1 tablet by mouth daily., Disp: 90 tablet, Rfl: 0 ??  Vitamin D, Ergocalciferol, (DRISDOL) 1.25 MG (50000 UT) CAPS capsule, Take 1 capsule (50,000 Units total) by mouth 2 (two) times a week., Disp: 24 capsule, Rfl: 1  ? ?Allergies  ?Allergen Reactions  ?? Shellfish Allergy Hives  ?  ? ?Review of Systems  ?Constitutional: Negative.   ?HENT: Negative.    ?Eyes: Negative.   ?Respiratory: Negative.    ?Cardiovascular: Negative.   ?Gastrointestinal: Negative.   ?Endocrine: Negative.   ?Genitourinary: Negative.    ?Musculoskeletal: Negative.   ?Skin: Negative.   ?Allergic/Immunologic: Negative.   ?Neurological: Negative.   ?Hematological: Negative.   ?Psychiatric/Behavioral: Negative.     ? ?Today's Vitals  ? 12/28/21 0831  ?BP: 118/64  ?Pulse: 91  ?Temp: 98.2 ?F (36.8 ?C)  ?TempSrc: Oral  ?Weight: (!) 337 lb (152.9 kg)  ?Height: 5\' 2"  (1.575 m)  ? ?Body mass index is 61.64 kg/m?.  ? ?Objective:  ?Physical Exam  ? ?   ?Assessment And Plan:  ?   ?There are no diagnoses linked to this encounter.  ? ? ?Patient was given opportunity to ask questions. Patient verbalized understanding of the plan and was able to repeat key elements of the plan. All questions were answered to their satisfaction.  ?Yair Dusza  ? ?I, , have reviewed all documentation for this visit. The documentation on 12/28/21 for the exam, diagnosis, procedures, and orders are all accurate and complete.  ? ?IF YOU HAVE BEEN REFERRED TO A SPECIALIST, IT MAY TAKE 1-2 WEEKS TO SCHEDULE/PROCESS THE REFERRAL. IF YOU HAVE NOT HEARD FROM US/SPECIALIST IN TWO WEEKS, PLEASE GIVE 02/27/22 A CALL AT [phone] X [phone].  ? ?THE PATIENT IS ENCOURAGED TO PRACTICE SOCIAL DISTANCING DUE TO THE COVID-19 PANDEMIC.   ?

## 2022-02-06 ENCOUNTER — Encounter: Payer: Self-pay | Admitting: Family

## 2022-03-02 LAB — COLOGUARD: COLOGUARD: POSITIVE — AB

## 2022-03-15 ENCOUNTER — Encounter: Payer: Self-pay | Admitting: Internal Medicine

## 2022-03-15 ENCOUNTER — Ambulatory Visit: Payer: 59 | Admitting: Internal Medicine

## 2022-03-15 VITALS — BP 120/60 | HR 85 | Temp 98.1°F | Ht 62.0 in | Wt 330.4 lb

## 2022-03-15 DIAGNOSIS — I10 Essential (primary) hypertension: Secondary | ICD-10-CM | POA: Diagnosis not present

## 2022-03-15 DIAGNOSIS — Z6841 Body Mass Index (BMI) 40.0 and over, adult: Secondary | ICD-10-CM

## 2022-03-15 DIAGNOSIS — Z23 Encounter for immunization: Secondary | ICD-10-CM

## 2022-03-15 DIAGNOSIS — E8881 Metabolic syndrome: Secondary | ICD-10-CM

## 2022-03-15 DIAGNOSIS — D500 Iron deficiency anemia secondary to blood loss (chronic): Secondary | ICD-10-CM

## 2022-03-15 MED ORDER — TELMISARTAN-HCTZ 80-25 MG PO TABS: 1.0000 | ORAL_TABLET | Freq: Every day | ORAL | 2 refills | Status: DC

## 2022-03-15 NOTE — Patient Instructions (Signed)
Hypertension, Adult ?Hypertension is another name for high blood pressure. High blood pressure forces your heart to work harder to pump blood. This can cause problems over time. ?There are two numbers in a blood pressure reading. There is a top number (systolic) over a bottom number (diastolic). It is best to have a blood pressure that is below 120/80. ?What are the causes? ?The cause of this condition is not known. Some other conditions can lead to high blood pressure. ?What increases the risk? ?Some lifestyle factors can make you more likely to develop high blood pressure: ?Smoking. ?Not getting enough exercise or physical activity. ?Being overweight. ?Having too much fat, sugar, calories, or salt (sodium) in your diet. ?Drinking too much alcohol. ?Other risk factors include: ?Having any of these conditions: ?Heart disease. ?Diabetes. ?High cholesterol. ?Kidney disease. ?Obstructive sleep apnea. ?Having a family history of high blood pressure and high cholesterol. ?Age. The risk increases with age. ?Stress. ?What are the signs or symptoms? ?High blood pressure may not cause symptoms. Very high blood pressure (hypertensive crisis) may cause: ?Headache. ?Fast or uneven heartbeats (palpitations). ?Shortness of breath. ?Nosebleed. ?Vomiting or feeling like you may vomit (nauseous). ?Changes in how you see. ?Very bad chest pain. ?Feeling dizzy. ?Seizures. ?How is this treated? ?This condition is treated by making healthy lifestyle changes, such as: ?Eating healthy foods. ?Exercising more. ?Drinking less alcohol. ?Your doctor may prescribe medicine if lifestyle changes do not help enough and if: ?Your top number is above 130. ?Your bottom number is above 80. ?Your personal target blood pressure may vary. ?Follow these instructions at home: ?Eating and drinking ? ?If told, follow the DASH eating plan. To follow this plan: ?Fill one half of your plate at each meal with fruits and vegetables. ?Fill one fourth of your plate  at each meal with whole grains. Whole grains include whole-wheat pasta, brown rice, and whole-grain bread. ?Eat or drink low-fat dairy products, such as skim milk or low-fat yogurt. ?Fill one fourth of your plate at each meal with low-fat (lean) proteins. Low-fat proteins include fish, chicken without skin, eggs, beans, and tofu. ?Avoid fatty meat, cured and processed meat, or chicken with skin. ?Avoid pre-made or processed food. ?Limit the amount of salt in your diet to less than 1,500 mg each day. ?Do not drink alcohol if: ?Your doctor tells you not to drink. ?You are pregnant, may be pregnant, or are planning to become pregnant. ?If you drink alcohol: ?Limit how much you have to: ?0-1 drink a day for women. ?0-2 drinks a day for men. ?Know how much alcohol is in your drink. In the [place] one drink equals one 12 oz bottle of beer (355 mL), one 5 oz glass of wine (148 mL), or one 1? oz glass of hard liquor (44 mL). ?Lifestyle ? ?Work with your doctor to stay at a healthy weight or to lose weight. Ask your doctor what the best weight is for you. ?Get at least 30 minutes of exercise that causes your heart to beat faster (aerobic exercise) most days of the week. This may include walking, swimming, or biking. ?Get at least 30 minutes of exercise that strengthens your muscles (resistance exercise) at least 3 days a week. This may include lifting weights or doing Pilates. ?Do not smoke or use any products that contain nicotine or tobacco. If you need help quitting, ask your doctor. ?Check your blood pressure at home as told by your doctor. ?Keep all follow-up visits. ?Medicines ?Take over-the-counter and prescription medicines   only as told by your doctor. Follow directions carefully. ?Do not skip doses of blood pressure medicine. The medicine does not work as well if you skip doses. Skipping doses also puts you at risk for problems. ?Ask your doctor about side effects or reactions to medicines that you should watch  for. ?Contact a doctor if: ?You think you are having a reaction to the medicine you are taking. ?You have headaches that keep coming back. ?You feel dizzy. ?You have swelling in your ankles. ?You have trouble with your vision. ?Get help right away if: ?You get a very bad headache. ?You start to feel mixed up (confused). ?You feel weak or numb. ?You feel faint. ?You have very bad pain in your: ?Chest. ?Belly (abdomen). ?You vomit more than once. ?You have trouble breathing. ?These symptoms may be an emergency. Get help right away. Call 911. ?Do not wait to see if the symptoms will go away. ?Do not drive yourself to the hospital. ?Summary ?Hypertension is another name for high blood pressure. ?High blood pressure forces your heart to work harder to pump blood. ?For most people, a normal blood pressure is less than 120/80. ?Making healthy choices can help lower blood pressure. If your blood pressure does not get lower with healthy choices, you may need to take medicine. ?This information is not intended to replace advice given to you by your health care provider. Make sure you discuss any questions you have with your health care provider. ?Document Revised: 06/03/2021 Document Reviewed: 06/03/2021 ?Elsevier Patient Education ? 2023 Elsevier Inc. ? ?

## 2022-03-15 NOTE — Progress Notes (Signed)
Barnet Glasgow Martin,acting as a Education administrator for [name], MD.,have documented all relevant documentation on the behalf of [name], MD,as directed by  [name], MD while in the presence of [name], MD.   Subjective:     Patient ID: Ashley Harrison , female    DOB: 11-Aug-1971 , 51 y.o.   MRN: [id number]   Chief Complaint  Patient presents with   Hypertension    HPI  Patient presents today for a pre dm and bp check. She reports compliance with meds. She denies headaches, chest pain and shortness of breath. Admits she is not exercising as much as she should. Patient has no other issues today.   BP Readings from Last 3 Encounters: 03/15/22 : 120/60 12/28/21 : 118/64 12/13/21 : 124/70    Hypertension This is a chronic problem. The current episode started more than 1 year ago. The problem has been gradually worsening since onset. The problem is uncontrolled. Pertinent negatives include no anxiety, blurred vision, headaches or peripheral edema. There are no associated agents to hypertension. Risk factors for coronary artery disease include obesity and sedentary lifestyle. Past treatments include angiotensin blockers and diuretics. There are no compliance problems.  There is no history of angina or kidney disease.     Past Medical History:  Diagnosis Date   Hypertension    Iron deficiency anemia due to chronic blood loss 10/31/2017   Iron deficiency anemia, unspecified    Menometrorrhagia 10/31/2017   Obesity      Family History  Problem Relation Age of Onset   Diabetes Mother    Hyperlipidemia Father    Hypertension Father      Current Outpatient Medications:    CALCIUM PO, Take 1 tablet by mouth daily at 2 am., Disp: , Rfl:    diclofenac Sodium (VOLTAREN) 1 % GEL, Apply 2 g topically 4 (four) times daily., Disp: 100 g, Rfl: 2   Semaglutide (RYBELSUS) 3 MG TABS, Take 3 mg by mouth daily., Disp: 30 tablet, Rfl: 3   Vitamin D, Ergocalciferol, (DRISDOL) 1.25 MG  (50000 UT) CAPS capsule, Take 1 capsule (50,000 Units total) by mouth 2 (two) times a week., Disp: 24 capsule, Rfl: 1   telmisartan-hydrochlorothiazide (MICARDIS HCT) 80-25 MG tablet, Take 1 tablet by mouth daily., Disp: 90 tablet, Rfl: 2   Allergies  Allergen Reactions   Shellfish Allergy Hives     Review of Systems  Constitutional: Negative.   HENT: Negative.    Eyes: Negative.  Negative for blurred vision.  Respiratory: Negative.    Cardiovascular: Negative.   Gastrointestinal: Negative.   Neurological:  Negative for headaches.  Psychiatric/Behavioral: Negative.       Today's Vitals   03/15/22 0917  BP: 120/60  Pulse: 85  Temp: 98.1 F (36.7 C)  TempSrc: Oral  Weight: (!) 330 lb 6.4 oz (149.9 kg)  Height: _0  (1.575 m)  PainSc: 0-No pain   Body mass index is 60.43 kg/m.  Wt Readings from Last 3 Encounters:  03/15/22 (!) 330 lb 6.4 oz (149.9 kg)  12/28/21 (!) 337 lb (152.9 kg)  12/13/21 (!) 337 lb 9.6 oz (153.1 kg)    Objective:  Physical Exam Vitals and nursing note reviewed.  Constitutional:      Appearance: Normal appearance. She is obese.  HENT:     Head: Normocephalic and atraumatic.  Eyes:     Extraocular Movements: Extraocular movements intact.     Comments: Pale conjunctiva  Cardiovascular:  Rate and Rhythm: Normal rate and regular rhythm.     Heart sounds: Normal heart sounds.  Pulmonary:     Effort: Pulmonary effort is normal.     Breath sounds: Normal breath sounds.  Musculoskeletal:     Cervical back: Normal range of motion.  Skin:    General: Skin is warm.  Neurological:     General: No focal deficit present.     Mental Status: She is alert.  Psychiatric:        Mood and Affect: Mood normal.        Behavior: Behavior normal.         Assessment And Plan:     1. Essential hypertension, benign Comments: Chronic, well controlled. She will c/w telmisartan/hctz 80/53m daily. Advised to c/w low sodium diet. She will f/u in 6 months.   - CMP14+EGFR  2. Insulin resistance Comments: I will recheck insulin/a1c levels today. She has tolerated Rybelsus without any issues.  - Hemoglobin A1c  3. Iron deficiency anemia due to chronic blood loss Comments: I will check CBC and iron panel today. She does have menorrhagia, followed by GYN and may proceed with partial hysterectomy.  - CBC no Diff - Iron, TIBC and Ferritin Panel  4. Class 3 severe obesity due to excess calories with serious comorbidity and body mass index (BMI) of 60.0 to 69.9 in adult ([facility] Comments: BMI 60. She was congratulated on her 7 lbs weight loss and encouraged to keep up the great work.   5. Need for shingles vaccine - Varicella-zoster vaccine IM (Shingrix)   Patient was given opportunity to ask questions. Patient verbalized understanding of the plan and was able to repeat key elements of the plan. All questions were answered to their satisfaction.   I, RMaximino Greenland MD, have reviewed all documentation for this visit. The documentation on 03/15/22 for the exam, diagnosis, procedures, and orders are all accurate and complete.   IF YOU HAVE BEEN REFERRED TO A SPECIALIST, IT MAY TAKE 1-2 WEEKS TO SCHEDULE/PROCESS THE REFERRAL. IF YOU HAVE NOT HEARD FROM US/SPECIALIST IN TWO WEEKS, PLEASE GIVE [place] CALL AT [phone] X [phone].   THE PATIENT IS ENCOURAGED TO PRACTICE SOCIAL DISTANCING DUE TO THE COVID-19 PANDEMIC.

## 2022-03-16 LAB — CMP14+EGFR
ALT: 13 IU/L (ref 0–32)
AST: 13 IU/L (ref 0–40)
Albumin/Globulin Ratio: 1.5 (ref 1.2–2.2)
Albumin: 4.2 g/dL (ref 3.8–4.9)
Alkaline Phosphatase: 121 IU/L (ref 44–121)
BUN/Creatinine Ratio: 9 (ref 9–23)
BUN: 6 mg/dL (ref 6–24)
Bilirubin Total: 0.5 mg/dL (ref 0.0–1.2)
CO2: 21 mmol/L (ref 20–29)
Calcium: 9.6 mg/dL (ref 8.7–10.2)
Chloride: 102 mmol/L (ref 96–106)
Creatinine, Ser: 0.69 mg/dL (ref 0.57–1.00)
Globulin, Total: 2.8 g/dL (ref 1.5–4.5)
Glucose: 103 mg/dL — ABNORMAL HIGH (ref 70–99)
Potassium: 4.2 mmol/L (ref 3.5–5.2)
Sodium: 139 mmol/L (ref 134–144)
Total Protein: 7 g/dL (ref 6.0–8.5)
eGFR: 104 mL/min/{1.73_m2} (ref >59)

## 2022-03-16 LAB — CBC
Hematocrit: 38 % (ref 34.0–46.6)
Hemoglobin: 11.3 g/dL (ref 11.1–15.9)
MCH: 23.1 pg — ABNORMAL LOW (ref 26.6–33.0)
MCHC: 29.7 g/dL — ABNORMAL LOW (ref 31.5–35.7)
MCV: 78 fL — ABNORMAL LOW (ref 79–97)
Platelets: 370 10*3/uL (ref 150–450)
RBC: 4.9 x10E6/uL (ref 3.77–5.28)
RDW: 15.3 % (ref 11.7–15.4)
WBC: 7.3 10*3/uL (ref 3.4–10.8)

## 2022-03-16 LAB — HEMOGLOBIN A1C
Est. average glucose Bld gHb Est-mCnc: 126 mg/dL
Hgb A1c MFr Bld: 6 % — ABNORMAL HIGH (ref 4.8–5.6)

## 2022-03-16 LAB — IRON,TIBC AND FERRITIN PANEL
Ferritin: 110 ng/mL (ref 15–150)
Iron Saturation: 10 % — ABNORMAL LOW (ref 15–55)
Iron: 37 ug/dL (ref 27–159)
Total Iron Binding Capacity: 383 ug/dL (ref 250–450)
UIBC: 346 ug/dL (ref 131–425)

## 2022-03-23 ENCOUNTER — Other Ambulatory Visit: Payer: Self-pay | Admitting: Gastroenterology

## 2022-03-25 ENCOUNTER — Encounter (HOSPITAL_COMMUNITY): Payer: Self-pay | Admitting: Gastroenterology

## 2022-03-28 NOTE — Anesthesia Preprocedure Evaluation (Addendum)
Anesthesia Evaluation  Patient identified by MRN, date of birth, ID band Patient awake    Reviewed: Allergy & Precautions, NPO status , Patient's Chart, lab work & pertinent test results  Airway Mallampati: II  TM Distance: >3 FB Neck ROM: Full    Dental no notable dental hx. (+) Teeth Intact, Dental Advisory Given   Pulmonary neg pulmonary ROS,    Pulmonary exam normal breath sounds clear to auscultation       Cardiovascular hypertension, Normal cardiovascular exam Rhythm:Regular Rate:Normal     Neuro/Psych negative neurological ROS  negative psych ROS   GI/Hepatic negative GI ROS, Neg liver ROS,   Endo/Other  diabetesMorbid obesity  Renal/GU Lab Results      Component                Value               Date                      CREATININE               0.69                03/15/2022                K                        4.2                 03/15/2022                   Musculoskeletal   Abdominal (+) + obese,   Peds  Hematology  (+) Blood dyscrasia, anemia , Lab Results      Component                Value               Date                      WBC                      7.3                 03/15/2022                HGB                      11.3                03/15/2022                HCT                      38.0                03/15/2022                MCV                      78 (L)              03/15/2022                PLT  370                 03/15/2022              Anesthesia Other Findings   Reproductive/Obstetrics                            Anesthesia Physical Anesthesia Plan  ASA: 3  Anesthesia Plan: MAC   Post-op Pain Management:    Induction: Intravenous  PONV Risk Score and Plan: Treatment may vary due to age or medical condition  Airway Management Planned: Nasal Cannula and Natural Airway  Additional Equipment: None  Intra-op Plan:    Post-operative Plan: Extubation in OR  Informed Consent: I have reviewed the patients History and Physical, chart, labs and discussed the procedure including the risks, benefits and alternatives for the proposed anesthesia with the patient or authorized representative who has indicated his/her understanding and acceptance.     Dental advisory given  Plan Discussed with: CRNA and Anesthesiologist  Anesthesia Plan Comments: (Positive cologaard for colonoscopy)      Anesthesia Quick Evaluation

## 2022-03-29 ENCOUNTER — Ambulatory Visit (HOSPITAL_BASED_OUTPATIENT_CLINIC_OR_DEPARTMENT_OTHER): Payer: 59 | Admitting: Anesthesiology

## 2022-03-29 ENCOUNTER — Other Ambulatory Visit: Payer: Self-pay

## 2022-03-29 ENCOUNTER — Encounter (HOSPITAL_COMMUNITY)
Admission: RE | Disposition: A | Discharge status: Home / Self Care | Payer: Self-pay | Source: Home / Self Care | Attending: Gastroenterology

## 2022-03-29 ENCOUNTER — Ambulatory Visit (HOSPITAL_COMMUNITY)
Admission: RE | Admit: 2022-03-29 | Discharge: 2022-03-29 | Disposition: A | Discharge status: Home / Self Care | Payer: 59 | Attending: Gastroenterology | Admitting: Gastroenterology

## 2022-03-29 ENCOUNTER — Ambulatory Visit (HOSPITAL_COMMUNITY): Payer: 59 | Admitting: Anesthesiology

## 2022-03-29 ENCOUNTER — Encounter (HOSPITAL_COMMUNITY): Payer: Self-pay | Admitting: Gastroenterology

## 2022-03-29 DIAGNOSIS — I10 Essential (primary) hypertension: Secondary | ICD-10-CM | POA: Diagnosis not present

## 2022-03-29 DIAGNOSIS — K5730 Diverticulosis of large intestine without perforation or abscess without bleeding: Secondary | ICD-10-CM

## 2022-03-29 DIAGNOSIS — Z6841 Body Mass Index (BMI) 40.0 and over, adult: Secondary | ICD-10-CM | POA: Insufficient documentation

## 2022-03-29 DIAGNOSIS — R198 Other specified symptoms and signs involving the digestive system and abdomen: Secondary | ICD-10-CM | POA: Insufficient documentation

## 2022-03-29 DIAGNOSIS — E119 Type 2 diabetes mellitus without complications: Secondary | ICD-10-CM | POA: Insufficient documentation

## 2022-03-29 DIAGNOSIS — K635 Polyp of colon: Secondary | ICD-10-CM | POA: Diagnosis not present

## 2022-03-29 DIAGNOSIS — K648 Other hemorrhoids: Secondary | ICD-10-CM

## 2022-03-29 DIAGNOSIS — Z1211 Encounter for screening for malignant neoplasm of colon: Secondary | ICD-10-CM | POA: Insufficient documentation

## 2022-03-29 HISTORY — PX: COLONOSCOPY WITH PROPOFOL: SHX5780

## 2022-03-29 HISTORY — PX: POLYPECTOMY: SHX5525

## 2022-03-29 LAB — GLUCOSE, CAPILLARY: Glucose-Capillary: 99 mg/dL (ref 70–99)

## 2022-03-29 LAB — HM COLONOSCOPY

## 2022-03-29 SURGERY — COLONOSCOPY WITH PROPOFOL
Anesthesia: Monitor Anesthesia Care

## 2022-03-29 MED ORDER — PROPOFOL 500 MG/50ML IV EMUL
INTRAVENOUS | Status: DC | PRN
  Administered 2022-03-29: 80 ug/kg/min via INTRAVENOUS

## 2022-03-29 MED ORDER — LACTATED RINGERS IV SOLN: INTRAVENOUS | Status: DC

## 2022-03-29 MED ORDER — PROPOFOL 1000 MG/100ML IV EMUL
INTRAVENOUS | Status: AC
  Filled 2022-03-29: qty 100

## 2022-03-29 MED ORDER — LACTATED RINGERS IV SOLN
INTRAVENOUS | Status: AC | PRN
  Administered 2022-03-29: 1000 mL via INTRAVENOUS

## 2022-03-29 MED ORDER — SODIUM CHLORIDE 0.9 % IV SOLN: INTRAVENOUS | Status: DC

## 2022-03-29 MED ORDER — LIDOCAINE HCL (CARDIAC) PF 100 MG/5ML IV SOSY
PREFILLED_SYRINGE | INTRAVENOUS | Status: DC | PRN
  Administered 2022-03-29: 100 mg via INTRAVENOUS

## 2022-03-29 MED ORDER — PROPOFOL 10 MG/ML IV BOLUS
INTRAVENOUS | Status: DC | PRN
  Administered 2022-03-29: 25 mg via INTRAVENOUS
  Administered 2022-03-29 (×3): 20 mg via INTRAVENOUS
  Administered 2022-03-29: 40 mg via INTRAVENOUS

## 2022-03-29 SURGICAL SUPPLY — 22 items

## 2022-03-29 NOTE — Anesthesia Postprocedure Evaluation (Signed)
Anesthesia Post Note  Patient: Ashley Harrison  Procedure(s) Performed: COLONOSCOPY WITH PROPOFOL POLYPECTOMY     Patient location during evaluation: Endoscopy Anesthesia Type: MAC Level of consciousness: awake and alert Pain management: pain level controlled Vital Signs Assessment: post-procedure vital signs reviewed and stable Respiratory status: spontaneous breathing, nonlabored ventilation, respiratory function stable and patient connected to nasal cannula oxygen Cardiovascular status: blood pressure returned to baseline and stable Postop Assessment: no apparent nausea or vomiting Anesthetic complications: no   No notable events documented.  Last Vitals:  Vitals:   03/29/22 1213 03/29/22 1223  BP: (!) 161/103 (!) 179/111  Pulse: 94 95  Resp: (!) 21 (!) 23  Temp:    SpO2: 99% 100%    Last Pain:  Vitals:   03/29/22 1223  TempSrc:   PainSc: 0-No pain                 Barnet Glasgow

## 2022-03-29 NOTE — Transfer of Care (Signed)
Immediate Anesthesia Transfer of Care Note  Patient: Ashley Harrison  Procedure(s) Performed: COLONOSCOPY WITH PROPOFOL POLYPECTOMY  Patient Location: PACU  Anesthesia Type:MAC  Level of Consciousness: awake and patient cooperative  Airway & Oxygen Therapy: Patient Spontanous Breathing and Patient connected to face mask oxygen  Post-op Assessment: Report given to RN and Post -op Vital signs reviewed and stable  Post vital signs: Reviewed and stable  Last Vitals:  Vitals Value Taken Time  BP 151/106 03/29/22 1203  Temp    Pulse 96 03/29/22 1204  Resp 31 03/29/22 1204  SpO2 100 % 03/29/22 1204  Vitals shown include unvalidated device data.  Last Pain:  Vitals:   03/29/22 1021  TempSrc: Tympanic  PainSc: 0-No pain         Complications: No notable events documented.

## 2022-03-29 NOTE — H&P (Signed)
Primary Care Physician:  [name], MD Primary Gastroenterologist: [facility]  Reason for visit-positive Cologuard  HPI: Ashley Harrison is a 51 y.o. female with past medical history of morbid obesity here for outpatient colonoscopy for positive Cologuard.  No previous colonoscopy.  No family history of colon cancer.  Past Medical History:  Diagnosis Date   Hypertension    Iron deficiency anemia due to chronic blood loss 10/31/2017   Iron deficiency anemia, unspecified    Menometrorrhagia 10/31/2017   Obesity     Past Surgical History:  Procedure Laterality Date   CESAREAN SECTION  01/2001, 05/2004    x 2   HYSTEROSCOPY WITH NOVASURE N/A 02/06/2014   Procedure: HYSTEROSCOPY WITH NOVASURE;  Surgeon: Oliver Pila, MD;  Location: [facility];  Service: Gynecology;  Laterality: N/A;  1hr OR time   TUBAL LIGATION  2005   WISDOM TOOTH EXTRACTION  1988    Prior to Admission medications   Medication Sig Start Date End Date Taking? Authorizing Provider  diclofenac Sodium (VOLTAREN) 1 % GEL Apply 2 g topically 4 (four) times daily. Patient taking differently: Apply 2 g topically daily as needed (shoulder pain). 09/09/21  Yes Arnette Felts, FNP  Multiple Vitamins-Minerals (MULTIVITAMIN WITH MINERALS) tablet Take 2 tablets by mouth daily.   Yes [provider]  Semaglutide (RYBELSUS) 3 MG TABS Take 3 mg by mouth daily. 12/13/21  Yes [name], MD  telmisartan-hydrochlorothiazide (MICARDIS HCT) 80-25 MG tablet Take 1 tablet by mouth daily. 03/15/22  Yes [name], MD    Scheduled Meds: Continuous Infusions:  sodium chloride     lactated ringers     PRN Meds:.  Allergies as of 03/23/2022 - Review Complete 03/23/2022  Allergen Reaction Noted   Shellfish allergy Hives 10/31/2017    Family History  Problem Relation Age of Onset   Diabetes Mother    Hyperlipidemia Father    Hypertension Father     Social History   Socioeconomic History   Marital status: Divorced     Spouse name: Not on file   Number of children: Not on file   Years of education: Not on file   Highest education level: Not on file  Occupational History   Not on file  Tobacco Use   Smoking status: Never   Smokeless tobacco: Never  Vaping Use   Vaping Use: Never used  Substance and Sexual Activity   Alcohol use: No   Drug use: No   Sexual activity: Not Currently    Birth control/protection: Surgical  Other Topics Concern   Not on file  Social History Narrative   Not on file   Social Determinants of Health   Financial Resource Strain: Not on file  Food Insecurity: Not on file  Transportation Needs: Not on file  Physical Activity: Not on file  Stress: Not on file  Social Connections: Not on file  Intimate Partner Violence: Not on file    Review of Systems: All negative except as stated above in HPI.  Physical Exam: Vital signs: There were no vitals filed for this visit.   General:   Obese, Well-developed, well-nourished, pleasant and cooperative in NAD Lungs:   Heart:  Regular rate and rhythm; no murmurs, clicks, rubs,  or gallops. Abdomen:  soft, NT, ND, BS +  Rectal:  Deferred  GI:  Lab Results: No results for input(s): "WBC", "HGB", "HCT", "PLT" in the last 72 hours. BMET No results for input(s): "NA", "K", "CL", "CO2", "GLUCOSE", "BUN", "CREATININE", "CALCIUM"  in the last 72 hours. LFT No results for input(s): "PROT", "ALBUMIN", "AST", "ALT", "ALKPHOS", "BILITOT", "BILIDIR", "IBILI" in the last 72 hours. PT/INR No results for input(s): "LABPROT", "INR" in the last 72 hours.   Studies/Results: No results found.  Impression/Plan: Positive Cologuard  Morbid obesity  Recommendation ---------------------- -Proceed with colonoscopy today.  Risks (bleeding, infection, bowel perforation that could require surgery, sedation-related changes in cardiopulmonary systems), benefits (identification and possible treatment of source of symptoms, exclusion of  certain causes of symptoms), and alternatives (watchful waiting, radiographic imaging studies, empiric medical treatment)  were explained to patient/family in detail and patient wishes to proceed.     LOS: 0 days   Kathi Der  MD, FACP 03/29/2022, 10:33 AM  Contact #  [phone]

## 2022-03-29 NOTE — Discharge Instructions (Signed)
YOU HAD AN ENDOSCOPIC PROCEDURE TODAY: Refer to the procedure report and other information in the discharge instructions given to you for any specific questions about what was found during the examination. If this information does not answer your questions, please call the [facility] office at [phone] to clarify.   YOU SHOULD EXPECT: Some feelings of bloating in the abdomen. Passage of more gas than usual. Walking can help get rid of the air that was put into your GI tract during the procedure and reduce the bloating. If you had a lower endoscopy (such as a colonoscopy or flexible sigmoidoscopy) you may notice spotting of blood in your stool or on the toilet paper. Some abdominal soreness may be present for a day or two, also.  DIET: Your first meal following the procedure should be a light meal and then it is ok to progress to your normal diet. A half-sandwich or bowl of soup is an example of a good first meal. Heavy or fried foods are harder to digest and may make you feel nauseous or bloated. Drink plenty of fluids but you should avoid alcoholic beverages for 24 hours.  ACTIVITY: Your care partner should take you home directly after the procedure. You should plan to take it easy, moving slowly for the rest of the day. You can resume normal activity the day after the procedure however YOU SHOULD NOT DRIVE, use power tools, machinery or perform tasks that involve climbing or major physical exertion for 24 hours (because of the sedation medicines used during the test).   SYMPTOMS TO REPORT IMMEDIATELY: A gastroenterologist can be reached at any hour. Please call [phone]  for any of the following symptoms:  Following lower endoscopy (colonoscopy, flexible sigmoidoscopy) Excessive amounts of blood in the stool  Significant tenderness, worsening of abdominal pains  Swelling of the abdomen that is new, acute  Fever of 100 or higher    FOLLOW UP:  If any biopsies were taken you will be  contacted by phone or by letter within the next 1-3 weeks. Call [phone]  if you have not heard about the biopsies in 3 weeks.  Please also call with any specific questions about appointments or follow up tests. 

## 2022-03-29 NOTE — Op Note (Signed)
[facility] Patient Name: Ashley Harrison Procedure Date: 03/29/2022 MRN: [id number] Attending MD: Kathi Der , MD Date of Birth: 07/22/1971 CSN: [id number] Age: 51 Admit Type: Outpatient Procedure:                Colonoscopy Indications:              This is the patient's first colonoscopy, Positive                            Cologuard test Providers:                Kathi Der, MD, Estella Husk RN, RN, Rozetta Nunnery, Technician Referring MD:              Medicines:                Sedation Administered by an Anesthesia Professional Complications:            No immediate complications. Estimated Blood Loss:     Estimated blood loss was minimal. Procedure:                Pre-Anesthesia Assessment:                           - Prior to the procedure, a History and Physical                            was performed, and patient medications and                            allergies were reviewed. The patient's tolerance of                            previous anesthesia was also reviewed. The risks                            and benefits of the procedure and the sedation                            options and risks were discussed with the patient.                            All questions were answered, and informed consent                            was obtained. Prior Anticoagulants: The patient has                            taken no previous anticoagulant or antiplatelet                            agents. ASA Grade Assessment: III - A patient with  severe systemic disease. After reviewing the risks                            and benefits, the patient was deemed in                            satisfactory condition to undergo the procedure.                           After obtaining informed consent, the colonoscope                            was passed under direct vision. Throughout the                             procedure, the patient's blood pressure, pulse, and                            oxygen saturations were monitored continuously. The                            PCF-HQ190L ([id number]) Olympus colonoscope was                            introduced through the anus and advanced to the the                            cecum, identified by appendiceal orifice and                            ileocecal valve. The colonoscopy was performed with                            moderate difficulty due to the patient's body                            habitus. Successful completion of the procedure was                            aided by applying abdominal pressure. The patient                            tolerated the procedure well. The quality of the                            bowel preparation was good except the cecum was                            fair. Scope In: 11:41:17 AM Scope Out: 11:57:58 AM Scope Withdrawal Time: 0 hours 5 minutes 47 seconds  Total Procedure Duration: 0 hours 16 minutes 41 seconds  Findings:      Hemorrhoids were found on perianal exam.      Scattered small-mouthed diverticula were found in the entire colon.  Two sessile polyps were found in the recto-sigmoid colon. The polyps       were 3 to 7 mm in size. These polyps were removed with a cold snare.       Resection and retrieval were complete.      Internal hemorrhoids were found during retroflexion. The hemorrhoids       were small. Impression:               - Hemorrhoids found on perianal exam.                           - Diverticulosis in the entire examined colon.                           - Two 3 to 7 mm polyps at the recto-sigmoid colon,                            removed with a cold snare. Resected and retrieved.                           - Internal hemorrhoids. Moderate Sedation:      Moderate (conscious) sedation was personally administered by an       anesthesia professional. The following parameters were  monitored: oxygen       saturation, heart rate, blood pressure, and response to care. Recommendation:           - Patient has a contact number available for                            emergencies. The signs and symptoms of potential                            delayed complications were discussed with the                            patient. Return to normal activities tomorrow.                            Written discharge instructions were provided to the                            patient.                           - Resume previous diet.                           - Continue present medications.                           - Await pathology results.                           - Repeat colonoscopy date to be determined after                            pending pathology results are  reviewed for                            surveillance based on pathology results.                           - Return to my office PRN. Procedure Code(s):        --- Professional ---                           [phone], Colonoscopy, flexible; with removal of                            tumor(s), polyp(s), or other lesion(s) by snare                            technique Diagnosis Code(s):        --- Professional ---                           K64.8, Other hemorrhoids                           K63.5, Polyp of colon                           R19.5, Other fecal abnormalities                           K57.30, Diverticulosis of large intestine without                            perforation or abscess without bleeding CPT copyright 2019 American Medical Association. All rights reserved. The codes documented in this report are preliminary and upon coder review may  be revised to meet current compliance requirements. Kathi Der, MD Kathi Der, MD 03/29/2022 12:03:20 PM Number of Addenda: 0

## 2022-03-30 LAB — SURGICAL PATHOLOGY

## 2022-06-16 ENCOUNTER — Encounter: Payer: Self-pay | Admitting: Internal Medicine

## 2022-06-16 ENCOUNTER — Ambulatory Visit: Payer: 59 | Admitting: Internal Medicine

## 2022-06-16 VITALS — BP 124/80 | HR 86 | Temp 97.9°F | Ht 62.6 in | Wt 322.4 lb

## 2022-06-16 DIAGNOSIS — I872 Venous insufficiency (chronic) (peripheral): Secondary | ICD-10-CM | POA: Diagnosis not present

## 2022-06-16 DIAGNOSIS — E88819 Insulin resistance, unspecified: Secondary | ICD-10-CM

## 2022-06-16 DIAGNOSIS — L84 Corns and callosities: Secondary | ICD-10-CM | POA: Diagnosis not present

## 2022-06-16 DIAGNOSIS — Z6841 Body Mass Index (BMI) 40.0 and over, adult: Secondary | ICD-10-CM

## 2022-06-16 DIAGNOSIS — Z2821 Immunization not carried out because of patient refusal: Secondary | ICD-10-CM

## 2022-06-16 DIAGNOSIS — D500 Iron deficiency anemia secondary to blood loss (chronic): Secondary | ICD-10-CM

## 2022-06-16 DIAGNOSIS — I10 Essential (primary) hypertension: Secondary | ICD-10-CM | POA: Diagnosis not present

## 2022-06-16 NOTE — Patient Instructions (Signed)
Hypertension, Adult ?Hypertension is another name for high blood pressure. High blood pressure forces your heart to work harder to pump blood. This can cause problems over time. ?There are two numbers in a blood pressure reading. There is a top number (systolic) over a bottom number (diastolic). It is best to have a blood pressure that is below 120/80. ?What are the causes? ?The cause of this condition is not known. Some other conditions can lead to high blood pressure. ?What increases the risk? ?Some lifestyle factors can make you more likely to develop high blood pressure: ?Smoking. ?Not getting enough exercise or physical activity. ?Being overweight. ?Having too much fat, sugar, calories, or salt (sodium) in your diet. ?Drinking too much alcohol. ?Other risk factors include: ?Having any of these conditions: ?Heart disease. ?Diabetes. ?High cholesterol. ?Kidney disease. ?Obstructive sleep apnea. ?Having a family history of high blood pressure and high cholesterol. ?Age. The risk increases with age. ?Stress. ?What are the signs or symptoms? ?High blood pressure may not cause symptoms. Very high blood pressure (hypertensive crisis) may cause: ?Headache. ?Fast or uneven heartbeats (palpitations). ?Shortness of breath. ?Nosebleed. ?Vomiting or feeling like you may vomit (nauseous). ?Changes in how you see. ?Very bad chest pain. ?Feeling dizzy. ?Seizures. ?How is this treated? ?This condition is treated by making healthy lifestyle changes, such as: ?Eating healthy foods. ?Exercising more. ?Drinking less alcohol. ?Your doctor may prescribe medicine if lifestyle changes do not help enough and if: ?Your top number is above 130. ?Your bottom number is above 80. ?Your personal target blood pressure may vary. ?Follow these instructions at home: ?Eating and drinking ? ?If told, follow the DASH eating plan. To follow this plan: ?Fill one half of your plate at each meal with fruits and vegetables. ?Fill one fourth of your plate  at each meal with whole grains. Whole grains include whole-wheat pasta, brown rice, and whole-grain bread. ?Eat or drink low-fat dairy products, such as skim milk or low-fat yogurt. ?Fill one fourth of your plate at each meal with low-fat (lean) proteins. Low-fat proteins include fish, chicken without skin, eggs, beans, and tofu. ?Avoid fatty meat, cured and processed meat, or chicken with skin. ?Avoid pre-made or processed food. ?Limit the amount of salt in your diet to less than 1,500 mg each day. ?Do not drink alcohol if: ?Your doctor tells you not to drink. ?You are pregnant, may be pregnant, or are planning to become pregnant. ?If you drink alcohol: ?Limit how much you have to: ?0-1 drink a day for women. ?0-2 drinks a day for men. ?Know how much alcohol is in your drink. In the [place] one drink equals one 12 oz bottle of beer (355 mL), one 5 oz glass of wine (148 mL), or one 1? oz glass of hard liquor (44 mL). ?Lifestyle ? ?Work with your doctor to stay at a healthy weight or to lose weight. Ask your doctor what the best weight is for you. ?Get at least 30 minutes of exercise that causes your heart to beat faster (aerobic exercise) most days of the week. This may include walking, swimming, or biking. ?Get at least 30 minutes of exercise that strengthens your muscles (resistance exercise) at least 3 days a week. This may include lifting weights or doing Pilates. ?Do not smoke or use any products that contain nicotine or tobacco. If you need help quitting, ask your doctor. ?Check your blood pressure at home as told by your doctor. ?Keep all follow-up visits. ?Medicines ?Take over-the-counter and prescription medicines   only as told by your doctor. Follow directions carefully. ?Do not skip doses of blood pressure medicine. The medicine does not work as well if you skip doses. Skipping doses also puts you at risk for problems. ?Ask your doctor about side effects or reactions to medicines that you should watch  for. ?Contact a doctor if: ?You think you are having a reaction to the medicine you are taking. ?You have headaches that keep coming back. ?You feel dizzy. ?You have swelling in your ankles. ?You have trouble with your vision. ?Get help right away if: ?You get a very bad headache. ?You start to feel mixed up (confused). ?You feel weak or numb. ?You feel faint. ?You have very bad pain in your: ?Chest. ?Belly (abdomen). ?You vomit more than once. ?You have trouble breathing. ?These symptoms may be an emergency. Get help right away. Call 911. ?Do not wait to see if the symptoms will go away. ?Do not drive yourself to the hospital. ?Summary ?Hypertension is another name for high blood pressure. ?High blood pressure forces your heart to work harder to pump blood. ?For most people, a normal blood pressure is less than 120/80. ?Making healthy choices can help lower blood pressure. If your blood pressure does not get lower with healthy choices, you may need to take medicine. ?This information is not intended to replace advice given to you by your health care provider. Make sure you discuss any questions you have with your health care provider. ?Document Revised: 06/03/2021 Document Reviewed: 06/03/2021 ?Elsevier Patient Education ? 2023 Elsevier Inc. ? ?

## 2022-06-16 NOTE — Progress Notes (Signed)
[name],acting as a Education administrator for [name], MD.,have documented all relevant documentation on the behalf of [name], MD,as directed by  [name], MD while in the presence of [name], MD.    Subjective:     Patient ID: Ashley Harrison , female    DOB: 24-Oct-1970 , 51 y.o.   MRN: [id number]   Chief Complaint  Patient presents with   Prediabetes   Hypertension    HPI  Patient presents today for HTN and prediabetes check. She reports compliance with meds.  She denies headaches, chest pain and shortness of breath.   Hypertension This is a chronic problem. The current episode started more than 1 year ago. The problem has been gradually worsening since onset. The problem is uncontrolled. Pertinent negatives include no anxiety, blurred vision, headaches or peripheral edema. There are no associated agents to hypertension. Risk factors for coronary artery disease include obesity and sedentary lifestyle. Past treatments include angiotensin blockers and diuretics. There are no compliance problems.  There is no history of angina or kidney disease.     Past Medical History:  Diagnosis Date   Hypertension    Iron deficiency anemia due to chronic blood loss 10/31/2017   Iron deficiency anemia, unspecified    Menometrorrhagia 10/31/2017   Obesity      Family History  Problem Relation Age of Onset   Diabetes Mother    Hyperlipidemia Father    Hypertension Father      Current Outpatient Medications:    Multiple Vitamins-Minerals (MULTIVITAMIN WITH MINERALS) tablet, Take 2 tablets by mouth daily., Disp: , Rfl:    Semaglutide (RYBELSUS) 3 MG TABS, Take 3 mg by mouth daily., Disp: 30 tablet, Rfl: 3   telmisartan-hydrochlorothiazide (MICARDIS HCT) 80-25 MG tablet, Take 1 tablet by mouth daily., Disp: 90 tablet, Rfl: 2   Allergies  Allergen Reactions   Shellfish Allergy Hives     Review of Systems  Constitutional: Negative.   Eyes: Negative.  Negative  for blurred vision.  Respiratory: Negative.    Cardiovascular: Negative.   Neurological: Negative.  Negative for headaches.  Psychiatric/Behavioral: Negative.       Today's Vitals   06/16/22 1009  BP: 124/80  Pulse: 86  Temp: 97.9 F (36.6 C)  Weight: (!) 322 lb 6.4 oz (146.2 kg)  Height: 5' 2.6" (1.59 m)  PainSc: 0-No pain   Body mass index is 57.84 kg/m.  Wt Readings from Last 3 Encounters:  06/16/22 (!) 322 lb 6.4 oz (146.2 kg)  03/29/22 (!) 330 lb 7.5 oz (149.9 kg)  03/15/22 (!) 330 lb 6.4 oz (149.9 kg)     Objective:  Physical Exam Vitals and nursing note reviewed.  Constitutional:      Appearance: Normal appearance. She is obese.  HENT:     Head: Normocephalic and atraumatic.     Nose:     Comments: Masked     Mouth/Throat:     Comments: Masked  Eyes:     Extraocular Movements: Extraocular movements intact.  Cardiovascular:     Rate and Rhythm: Normal rate and regular rhythm.     Heart sounds: Normal heart sounds.  Pulmonary:     Effort: Pulmonary effort is normal.     Breath sounds: Normal breath sounds.  Musculoskeletal:     Cervical back: Normal range of motion.  Feet:     Left foot:     Skin integrity: Callus and dry skin present.  Skin:  General: Skin is warm.  Neurological:     General: No focal deficit present.     Mental Status: She is alert.  Psychiatric:        Mood and Affect: Mood normal.        Behavior: Behavior normal.       Assessment And Plan:     1. Essential hypertension, benign Comments: Chronic, well controlled.  She will c/w telmisartan/hctz 80/22m daily.  - BMP8+EGFR  2. Insulin resistance Comments: Chronic, I will check labs as below. Again, importance of regular exercise was d/w patient. - BMP8+EGFR - Hemoglobin A1c - Insulin, random(561)  3. Callus of foot Comments: I will refer her to Podiatry for further evaluation.  - Ambulatory referral to Podiatry  4. Iron deficiency anemia due to chronic blood  loss Comments: Chronic, I will check CBC and iron levels. If low, will refer her to Hematology for iron infusion. She is unable to tolerate oral iron supplementation.  - CBC no Diff - Iron, TIBC and Ferritin Panel  5. Venous stasis dermatitis of both lower extremities Comments: She is encouraged to elevate her feet while seated and to wear compression hose daily.  - Ambulatory referral to Vascular Surgery  6. Class 3 severe obesity due to excess calories with serious comorbidity and body mass index (BMI) of 50.0 to 59.9 in adult ([facility] Comments: BMI 57. She is encouraged to increase her daily activity aiming for at least 150 minutes/wk, while initially striving for BMI<50.   7. Influenza vaccination declined   Patient was given opportunity to ask questions. Patient verbalized understanding of the plan and was able to repeat key elements of the plan. All questions were answered to their satisfaction.   I, RMaximino Greenland MD, have reviewed all documentation for this visit. The documentation on 06/16/22 for the exam, diagnosis, procedures, and orders are all accurate and complete.   IF YOU HAVE BEEN REFERRED TO A SPECIALIST, IT MAY TAKE 1-2 WEEKS TO SCHEDULE/PROCESS THE REFERRAL. IF YOU HAVE NOT HEARD FROM US/SPECIALIST IN TWO WEEKS, PLEASE GIVE [place] CALL AT [phone].   THE PATIENT IS ENCOURAGED TO PRACTICE SOCIAL DISTANCING DUE TO THE COVID-19 PANDEMIC.

## 2022-06-17 LAB — CBC
Hematocrit: 36 % (ref 34.0–46.6)
Hemoglobin: 11.2 g/dL (ref 11.1–15.9)
MCH: 23.2 pg — ABNORMAL LOW (ref 26.6–33.0)
MCHC: 31.1 g/dL — ABNORMAL LOW (ref 31.5–35.7)
MCV: 75 fL — ABNORMAL LOW (ref 79–97)
Platelets: 390 10*3/uL (ref 150–450)
RBC: 4.82 x10E6/uL (ref 3.77–5.28)
RDW: 16.1 % — ABNORMAL HIGH (ref 11.7–15.4)
WBC: 8.5 10*3/uL (ref 3.4–10.8)

## 2022-06-17 LAB — IRON,TIBC AND FERRITIN PANEL
Ferritin: 99 ng/mL (ref 15–150)
Iron Saturation: 9 % — CL (ref 15–55)
Iron: 34 ug/dL (ref 27–159)
Total Iron Binding Capacity: 368 ug/dL (ref 250–450)
UIBC: 334 ug/dL (ref 131–425)

## 2022-06-17 LAB — BMP8+EGFR
BUN/Creatinine Ratio: 12 (ref 9–23)
BUN: 8 mg/dL (ref 6–24)
CO2: 24 mmol/L (ref 20–29)
Calcium: 9.7 mg/dL (ref 8.7–10.2)
Chloride: 99 mmol/L (ref 96–106)
Creatinine, Ser: 0.67 mg/dL (ref 0.57–1.00)
Glucose: 102 mg/dL — ABNORMAL HIGH (ref 70–99)
Potassium: 4 mmol/L (ref 3.5–5.2)
Sodium: 142 mmol/L (ref 134–144)
eGFR: 105 mL/min/{1.73_m2} (ref >59)

## 2022-06-17 LAB — HEMOGLOBIN A1C
Est. average glucose Bld gHb Est-mCnc: 131 mg/dL
Hgb A1c MFr Bld: 6.2 % — ABNORMAL HIGH (ref 4.8–5.6)

## 2022-06-17 LAB — INSULIN, RANDOM: INSULIN: 26.6 u[IU]/mL — ABNORMAL HIGH (ref 2.6–24.9)

## 2022-06-18 ENCOUNTER — Other Ambulatory Visit: Payer: Self-pay | Admitting: Internal Medicine

## 2022-06-18 DIAGNOSIS — D500 Iron deficiency anemia secondary to blood loss (chronic): Secondary | ICD-10-CM

## 2022-06-28 ENCOUNTER — Other Ambulatory Visit: Payer: Self-pay | Admitting: Family

## 2022-06-28 ENCOUNTER — Ambulatory Visit: Payer: 59 | Admitting: Podiatry

## 2022-06-28 ENCOUNTER — Ambulatory Visit (INDEPENDENT_AMBULATORY_CARE_PROVIDER_SITE_OTHER): Payer: 59

## 2022-06-28 DIAGNOSIS — B351 Tinea unguium: Secondary | ICD-10-CM | POA: Diagnosis not present

## 2022-06-28 DIAGNOSIS — M79672 Pain in left foot: Secondary | ICD-10-CM | POA: Diagnosis not present

## 2022-06-28 DIAGNOSIS — B353 Tinea pedis: Secondary | ICD-10-CM

## 2022-06-28 DIAGNOSIS — D649 Anemia, unspecified: Secondary | ICD-10-CM

## 2022-06-28 MED ORDER — KETOCONAZOLE 2 % EX CREA: 1.0000 | TOPICAL_CREAM | Freq: Every day | CUTANEOUS | 0 refills | Status: DC

## 2022-06-28 NOTE — Progress Notes (Signed)
Subjective:   Patient ID: Ashley Harrison, female   DOB: 51 y.o.   MRN: [id number]   HPI Chief Complaint  Patient presents with   Nail Problem    Bilateral Nail Fungus, and left foot rash, patient denies any pain, Nails and dark and thick, dry skin   51 year old female presents above concerns.  She has had nail fungus for year. She has tried some type of pills and cream previously but the fungus came back.  She is not sure what medication she has tried.  She has had a callus/red spot on the side of the left foot for about 3 weeks.  She been keeping moisturizer on this and the skin is peeled off.  No open sores any swelling redness or drainage.  No itching. No open lesions, drainage, redness or signs of infection.   Last A1c was 6.0 Negative etoh and smoking  Review of Systems  All other systems reviewed and are negative.  Past Medical History:  Diagnosis Date   Hypertension    Iron deficiency anemia due to chronic blood loss 10/31/2017   Iron deficiency anemia, unspecified    Menometrorrhagia 10/31/2017   Obesity     Past Surgical History:  Procedure Laterality Date   CESAREAN SECTION  01/2001, 05/2004    x 2   COLONOSCOPY WITH PROPOFOL N/A 03/29/2022   Procedure: COLONOSCOPY WITH PROPOFOL;  Surgeon: Kathi Der, MD;  Location: [facility];  Service: Gastroenterology;  Laterality: N/A;   HYSTEROSCOPY WITH NOVASURE N/A 02/06/2014   Procedure: HYSTEROSCOPY WITH NOVASURE;  Surgeon: Oliver Pila, MD;  Location: [facility];  Service: Gynecology;  Laterality: N/A;  1hr OR time   POLYPECTOMY  03/29/2022   Procedure: POLYPECTOMY;  Surgeon: Kathi Der, MD;  Location: [facility];  Service: Gastroenterology;;   TUBAL LIGATION  2005   WISDOM TOOTH EXTRACTION  1988     Current Outpatient Medications:    ketoconazole (NIZORAL) 2 % cream, Apply 1 Application topically daily., Disp: 60 g, Rfl: 0   Multiple Vitamins-Minerals (MULTIVITAMIN WITH MINERALS) tablet, Take 2 tablets by  mouth daily., Disp: , Rfl:    Semaglutide (RYBELSUS) 3 MG TABS, Take 3 mg by mouth daily., Disp: 30 tablet, Rfl: 3   telmisartan-hydrochlorothiazide (MICARDIS HCT) 80-25 MG tablet, Take 1 tablet by mouth daily., Disp: 90 tablet, Rfl: 2  Allergies  Allergen Reactions   Shellfish Allergy Hives          Objective:  Physical Exam  General: AAO x3, NAD  Dermatological: Nails are hypertrophic, dystrophic with yellow, brown discoloration.  There is no edema, erythema surgical sites.  Interdigital tinea pedis is noted.  No drainage or pus.  No ascending cellulitis.  No open lesions.  Vascular: Dorsalis Pedis artery and Posterior Tibial artery pedal pulses are 2/4 bilateral with immedate capillary fill time. There is no pain with calf compression, swelling, warmth, erythema.   Neruologic: Grossly intact via light touch bilateral.   Musculoskeletal: No gross boney pedal deformities bilateral. No pain, crepitus, or limitation noted with foot and ankle range of motion bilateral. Muscular strength 5/5 in all groups tested bilateral.  Gait: Unassisted, Nonantalgic.       Assessment:   Onychomycosis, tinea pedis     Plan:  -Treatment options discussed including all alternatives, risks, and complications -Etiology of symptoms were discussed -We discussed different treatment options for nail fungus.  States that the sample of the nails and sent them for culture, pathology to [facility] labs.  Discussed treatment options  for nail fungus but with the results of the nail culture before proceeding with further treatment. -Prescribed ketoconazole for the skin  Trula Slade DPM

## 2022-06-28 NOTE — Patient Instructions (Signed)
Fungal Nail Infection A fungal nail infection is a common infection of the toenails or fingernails. This condition affects toenails more often than fingernails. It often affects the great, or big, toes. More than one nail may be infected. The condition can be passed from person to person (is contagious). What are the causes? This condition is caused by a fungus, such as yeast or molds. Several types of fungi can cause the infection. These fungi are common in moist and warm areas. If your hands or feet come into contact with the fungus, it may get into a crack in your fingernail or toenail or in the surrounding skin, and cause an infection. What increases the risk? The following factors may make you more likely to develop this condition: Being of older age. Having certain medical conditions, such as: Athlete's foot. Diabetes. Poor circulation. A weak body defense system (immune system). Walking barefoot in areas where the fungus thrives, such as showers or locker rooms. Wearing shoes and socks that cause your feet to sweat. Having a nail injury or a recent nail surgery. What are the signs or symptoms? Symptoms of this condition include: A pale spot on the nail. Thickening of the nail. A nail that becomes yellow, brown, or white. A brittle or ragged nail edge. A nail that has lifted away from the nail bed. How is this diagnosed? This condition is diagnosed with a physical exam. Your health care provider may take a scraping or clipping from your nail to test for the fungus. How is this treated? Treatment is not needed for mild infections. If you have significant nail changes, treatment may include: Antifungal medicines taken by mouth (orally). You may need to take the medicine for several weeks or several months, and you may not see the results for a long time. These medicines can cause side effects. Ask your health care provider what problems to watch for. Antifungal nail polish or nail  cream. These may be used along with oral antifungal medicines. Laser treatment of the nail. Surgery to remove the nail. This may be needed for the most severe infections. It can take a long time, usually up to a year, for the infection to go away. The infection may also come back. Follow these instructions at home: Medicines Take or apply over-the-counter and prescription medicines only as told by your health care provider. Ask your health care provider about using over-the-counter mentholated ointment on your nails. Nail care Trim your nails often. Wash and dry your hands and feet every day. Keep your feet dry. To do this: Wear absorbent socks, and change your socks frequently. Wear shoes that allow air to circulate, such as sandals or canvas tennis shoes. Throw out old shoes. If you go to a nail salon, make sure you choose one that uses clean instruments. Use antifungal foot powder on your feet and in your shoes. General instructions Do not share personal items, such as towels or nail clippers. Do not walk barefoot in shower rooms or locker rooms. Wear rubber gloves if you are working with your hands in wet areas. Keep all follow-up visits. This is important. Contact a health care provider if: You have redness, pain, or pus near the toenail or fingernail. Your infection is not getting better, or it is getting worse after several months. You have more circulation problems near the toenail or fingernail. You have brown or black discoloration of the nail that spreads to the surrounding skin. Summary A fungal nail infection is a common   infection of the toenails or fingernails. Treatment is not needed for mild infections. If you have significant nail changes, treatment may include taking medicine orally and applying medicine to your nails. It can take a long time, usually up to a year, for the infection to go away. The infection may also come back. Take or apply over-the-counter and  prescription medicines only as told by your health care provider. This information is not intended to replace advice given to you by your health care provider. Make sure you discuss any questions you have with your health care provider. Document Revised: 11/16/2020 Document Reviewed: 11/16/2020 Elsevier Patient Education  [place]  Athlete's foot (tinea pedis) is a fungal infection of the skin on your feet. It often occurs on the skin that is between or underneath your toes. It can also occur on the soles of your feet. Symptoms include itchy or white and flaky areas on the skin. The infection can spread from person to person (is contagious). It can also spread when a person's bare feet come in contact with the fungus on shower floors or on items such as shoes. Follow these instructions at home: Medicines Apply or take over-the-counter and prescription medicines only as told by your doctor. Apply your antifungal medicine as told by your doctor. Do not stop using it even if your feet start to get better. Foot care Do not scratch your feet. Keep your feet dry: Wear cotton or wool socks. Change your socks every day or if they become wet. Wear shoes that allow air to move around, such as sandals or canvas tennis shoes. Wash and dry your feet: Every day or as told by your doctor. After exercising. Including the area between your toes. General instructions Do not share any of these items that touch your feet: Towels. Shoes. Nail clippers. Other personal items. Protect your feet by wearing sandals in wet areas, such as locker rooms and shared showers. Keep all follow-up visits. If you have diabetes, keep your blood sugar under control. Contact a doctor if: You have a fever. You have swelling, pain, warmth, or redness in your foot. Your feet are not getting better with treatment. Your symptoms get worse. You have new symptoms. You have very bad  pain. Summary Athlete's foot is a fungal infection of the skin on your feet. This condition is caused by a fungus that grows in warm, moist places. Symptoms include itchy or white and flaky areas on the skin. Apply your antifungal medicine as told by your doctor. Keep your feet clean and dry. This information is not intended to replace advice given to you by your health care provider. Make sure you discuss any questions you have with your health care provider. Document Revised: 12/06/2020 Document Reviewed: 12/06/2020 Elsevier Patient Education  [place].

## 2022-06-29 ENCOUNTER — Inpatient Hospital Stay: Payer: 59 | Attending: Family

## 2022-06-29 ENCOUNTER — Inpatient Hospital Stay: Payer: 59 | Admitting: Family

## 2022-06-29 ENCOUNTER — Encounter: Payer: Self-pay | Admitting: Family

## 2022-06-29 VITALS — BP 155/82 | HR 73 | Temp 97.9°F | Resp 18 | Wt 326.0 lb

## 2022-06-29 DIAGNOSIS — D509 Iron deficiency anemia, unspecified: Secondary | ICD-10-CM

## 2022-06-29 DIAGNOSIS — D649 Anemia, unspecified: Secondary | ICD-10-CM

## 2022-06-29 LAB — CBC WITH DIFFERENTIAL (CANCER CENTER ONLY)
Abs Immature Granulocytes: 0.03 10*3/uL (ref 0.00–0.07)
Basophils Absolute: 0 10*3/uL (ref 0.0–0.1)
Basophils Relative: 0 %
Eosinophils Absolute: 0.2 10*3/uL (ref 0.0–0.5)
Eosinophils Relative: 2 %
HCT: 36.3 % (ref 36.0–46.0)
Hemoglobin: 11 g/dL — ABNORMAL LOW (ref 12.0–15.0)
Immature Granulocytes: 0 %
Lymphocytes Relative: 25 %
Lymphs Abs: 2.4 10*3/uL (ref 0.7–4.0)
MCH: 23.1 pg — ABNORMAL LOW (ref 26.0–34.0)
MCHC: 30.3 g/dL (ref 30.0–36.0)
MCV: 76.1 fL — ABNORMAL LOW (ref 80.0–100.0)
Monocytes Absolute: 0.6 10*3/uL (ref 0.1–1.0)
Monocytes Relative: 6 %
Neutro Abs: 6.4 10*3/uL (ref 1.7–7.7)
Neutrophils Relative %: 67 %
Platelet Count: 362 10*3/uL (ref 150–400)
RBC: 4.77 MIL/uL (ref 3.87–5.11)
RDW: 17.2 % — ABNORMAL HIGH (ref 11.5–15.5)
WBC Count: 9.6 10*3/uL (ref 4.0–10.5)
nRBC: 0 % (ref 0.0–0.2)

## 2022-06-29 LAB — CMP (CANCER CENTER ONLY)
ALT: 14 U/L (ref 0–44)
AST: 13 U/L — ABNORMAL LOW (ref 15–41)
Albumin: 4.2 g/dL (ref 3.5–5.0)
Alkaline Phosphatase: 122 U/L (ref 38–126)
Anion gap: 10 (ref 5–15)
BUN: 10 mg/dL (ref 6–20)
CO2: 29 mmol/L (ref 22–32)
Calcium: 10.2 mg/dL (ref 8.9–10.3)
Chloride: 103 mmol/L (ref 98–111)
Creatinine: 0.75 mg/dL (ref 0.44–1.00)
GFR, Estimated: >60 mL/min (ref >60)
Glucose, Bld: 112 mg/dL — ABNORMAL HIGH (ref 70–99)
Potassium: 3.7 mmol/L (ref 3.5–5.1)
Sodium: 142 mmol/L (ref 135–145)
Total Bilirubin: 0.6 mg/dL (ref 0.3–1.2)
Total Protein: 7.8 g/dL (ref 6.5–8.1)

## 2022-06-29 LAB — IRON AND IRON BINDING CAPACITY (CC-WL,HP ONLY)
Iron: 35 ug/dL (ref 28–170)
Saturation Ratios: 8 % — ABNORMAL LOW (ref 10.4–31.8)
TIBC: 437 ug/dL (ref 250–450)
UIBC: 402 ug/dL (ref 148–442)

## 2022-06-29 LAB — RETICULOCYTES
Immature Retic Fract: 29.8 % — ABNORMAL HIGH (ref 2.3–15.9)
RBC.: 4.75 MIL/uL (ref 3.87–5.11)
Retic Count, Absolute: 98.3 10*3/uL (ref 19.0–186.0)
Retic Ct Pct: 2.1 % (ref 0.4–3.1)

## 2022-06-29 LAB — FERRITIN: Ferritin: 57 ng/mL (ref 11–307)

## 2022-06-29 NOTE — Progress Notes (Signed)
Hematology and Oncology Follow Up Visit  Ashley Harrison [id number] 22-Jun-1971 51 y.o. 06/29/2022   Principle Diagnosis:  Iron deficiency anemia    Current Therapy:   IV iron as indicated   Interim History:  Ashley Harrison is here today to re-establish care for IDA. She is symptomatic with fatigue. She states that her cycle is now irregular not always occurring monthly with heavy flow at times.  She has discussed possibly having a partial hysterectomy with her gynecologist. Nothing has been decided yet.  No other blood loss noted. No bruising or petechiae.  No fever, chills, n/v, cough, rash, dizziness, SOB, chest pain, palpitations, abdominal pain or changes in bowel or bladder habits.  She has mild chronic swelling in her feet and ankles that waxes and wanes.  No tenderness, numbness or tingling in her extremities.  No falls or syncope.  Appetite and hydration are good. Weight is stable at 326 lbs.   ECOG Performance Status: 1 - Symptomatic but completely ambulatory  Medications:  Allergies as of 06/29/2022       Reactions   Shellfish Allergy Hives        Medication List        Accurate as of June 29, 2022  9:49 AM. If you have any questions, ask your nurse or doctor.          ketoconazole 2 % cream Commonly known as: NIZORAL Apply 1 Application topically daily.   multivitamin with minerals tablet Take 2 tablets by mouth daily.   Rybelsus 3 MG Tabs Generic drug: Semaglutide Take 3 mg by mouth daily.   telmisartan-hydrochlorothiazide 80-25 MG tablet Commonly known as: MICARDIS HCT Take 1 tablet by mouth daily.        Allergies:  Allergies  Allergen Reactions   Shellfish Allergy Hives    Past Medical History, Surgical history, Social history, and Family History were reviewed and updated.  Review of Systems: All other 10 point review of systems is negative.   Physical Exam:  weight is 326 lb (147.9 kg) (abnormal). Her oral temperature is 97.9  F (36.6 C). Her blood pressure is 155/82 (abnormal) and her pulse is 73. Her respiration is 18 and oxygen saturation is 99%.   Wt Readings from Last 3 Encounters:  06/29/22 (!) 326 lb (147.9 kg)  06/16/22 (!) 322 lb 6.4 oz (146.2 kg)  03/29/22 (!) 330 lb 7.5 oz (149.9 kg)    Ocular: Sclerae unicteric, pupils equal, round and reactive to light Ear-nose-throat: Oropharynx clear, dentition fair Lymphatic: No cervical or supraclavicular adenopathy Lungs no rales or rhonchi, good excursion bilaterally Heart regular rate and rhythm, no murmur appreciated Abd soft, nontender, positive bowel sounds MSK no focal spinal tenderness, no joint edema Neuro: non-focal, well-oriented, appropriate affect Breasts: Deferred   Lab Results  Component Value Date   WBC 9.6 06/29/2022   HGB 11.0 (L) 06/29/2022   HCT 36.3 06/29/2022   MCV 76.1 (L) 06/29/2022   PLT 362 06/29/2022   Lab Results  Component Value Date   FERRITIN 99 06/16/2022   IRON 34 06/16/2022   TIBC 368 06/16/2022   UIBC 334 06/16/2022   IRONPCTSAT 9 (LL) 06/16/2022   Lab Results  Component Value Date   RETICCTPCT 2.1 06/29/2022   RBC 4.75 06/29/2022   No results found for: "KPAFRELGTCHN", "LAMBDASER", "KAPLAMBRATIO" No results found for: "IGGSERUM", "IGA", "IGMSERUM" No results found for: "TOTALPROTELP", "ALBUMINELP", "A1GS", "A2GS", "BETS", "BETA2SER", "GAMS", "MSPIKE", "SPEI"   Chemistry      Component Value  Date/Time   NA 142 06/29/2022 0827   NA 142 06/16/2022 1114   K 3.7 06/29/2022 0827   CL 103 06/29/2022 0827   CO2 29 06/29/2022 0827   BUN 10 06/29/2022 0827   BUN 8 06/16/2022 1114   CREATININE 0.75 06/29/2022 0827      Component Value Date/Time   CALCIUM 10.2 06/29/2022 0827   ALKPHOS 122 06/29/2022 0827   AST 13 (L) 06/29/2022 0827   ALT 14 06/29/2022 0827   BILITOT 0.6 06/29/2022 0827       Impression and Plan: Ashley Harrison is a very pleasant 51 yo African American female with long history of iron  deficiency anemia.   We will get her set up for 3 doses of Venofer.  Follow-up in 3 months.   Eileen Stanford, NP 11/1/20239:49 AM

## 2022-07-01 ENCOUNTER — Other Ambulatory Visit: Payer: Self-pay | Admitting: Family

## 2022-07-01 LAB — HGB FRACTIONATION CASCADE
Hgb A2: 2.3 % (ref 1.8–3.2)
Hgb A: 97.7 % (ref 96.4–98.8)
Hgb F: 0 % (ref 0.0–2.0)
Hgb S: 0 %

## 2022-07-08 ENCOUNTER — Inpatient Hospital Stay: Payer: 59

## 2022-07-08 VITALS — BP 182/88 | HR 72 | Temp 98.0°F | Resp 17

## 2022-07-08 DIAGNOSIS — D500 Iron deficiency anemia secondary to blood loss (chronic): Secondary | ICD-10-CM

## 2022-07-08 DIAGNOSIS — D508 Other iron deficiency anemias: Secondary | ICD-10-CM

## 2022-07-08 DIAGNOSIS — D509 Iron deficiency anemia, unspecified: Secondary | ICD-10-CM | POA: Diagnosis not present

## 2022-07-08 DIAGNOSIS — N921 Excessive and frequent menstruation with irregular cycle: Secondary | ICD-10-CM

## 2022-07-08 MED ORDER — SODIUM CHLORIDE 0.9 % IV SOLN: Freq: Once | INTRAVENOUS | Status: AC

## 2022-07-08 MED ORDER — SODIUM CHLORIDE 0.9 % IV SOLN
300.0000 mg | Freq: Once | INTRAVENOUS | Status: AC
  Administered 2022-07-08: 300 mg via INTRAVENOUS
  Filled 2022-07-08: qty 300

## 2022-07-08 NOTE — Progress Notes (Signed)
Pt declined to stay for post infusion observation period. Pt stated she has tolerated medication multiple times prior without difficulty. Pt aware to call clinic with any questions or concerns. Pt verbalized understanding and had no further questions.  ? ?

## 2022-07-08 NOTE — Patient Instructions (Signed)

## 2022-07-11 LAB — ALPHA-THALASSEMIA GENOTYPR

## 2022-07-12 ENCOUNTER — Other Ambulatory Visit: Payer: Self-pay | Admitting: *Deleted

## 2022-07-12 DIAGNOSIS — M79605 Pain in left leg: Secondary | ICD-10-CM

## 2022-07-15 ENCOUNTER — Inpatient Hospital Stay: Payer: 59

## 2022-07-15 VITALS — BP 158/88 | HR 66 | Temp 97.8°F | Resp 18 | Ht 63.0 in | Wt 325.0 lb

## 2022-07-15 DIAGNOSIS — D500 Iron deficiency anemia secondary to blood loss (chronic): Secondary | ICD-10-CM

## 2022-07-15 DIAGNOSIS — N921 Excessive and frequent menstruation with irregular cycle: Secondary | ICD-10-CM

## 2022-07-15 DIAGNOSIS — D508 Other iron deficiency anemias: Secondary | ICD-10-CM

## 2022-07-15 DIAGNOSIS — D509 Iron deficiency anemia, unspecified: Secondary | ICD-10-CM | POA: Diagnosis not present

## 2022-07-15 MED ORDER — SODIUM CHLORIDE 0.9 % IV SOLN
300.0000 mg | Freq: Once | INTRAVENOUS | Status: AC
  Administered 2022-07-15: 300 mg via INTRAVENOUS
  Filled 2022-07-15: qty 300

## 2022-07-15 MED ORDER — SODIUM CHLORIDE 0.9 % IV SOLN: Freq: Once | INTRAVENOUS | Status: AC

## 2022-07-15 NOTE — Patient Instructions (Signed)

## 2022-07-19 ENCOUNTER — Ambulatory Visit: Payer: 59 | Admitting: Vascular Surgery

## 2022-07-19 ENCOUNTER — Ambulatory Visit (HOSPITAL_COMMUNITY)
Admission: RE | Admit: 2022-07-19 | Discharge: 2022-07-19 | Disposition: A | Discharge status: Home / Self Care | Payer: 59 | Source: Ambulatory Visit | Attending: Vascular Surgery | Admitting: Vascular Surgery

## 2022-07-19 ENCOUNTER — Encounter: Payer: Self-pay | Admitting: Vascular Surgery

## 2022-07-19 VITALS — BP 179/110 | HR 91 | Temp 97.7°F | Resp 16 | Ht 63.0 in | Wt 328.0 lb

## 2022-07-19 DIAGNOSIS — M79604 Pain in right leg: Secondary | ICD-10-CM | POA: Diagnosis present

## 2022-07-19 DIAGNOSIS — M79605 Pain in left leg: Secondary | ICD-10-CM

## 2022-07-19 DIAGNOSIS — I872 Venous insufficiency (chronic) (peripheral): Secondary | ICD-10-CM

## 2022-07-19 NOTE — Progress Notes (Signed)
Patient name: Ashley Harrison MRN: [id number] DOB: September 12, 1970 Sex: female  REASON FOR CONSULT: Bilateral venous stasis   HPI: Ashley Harrison is a 51 y.o. female, with HTN and morbid obesity (BMI 58) that presents for evaluation of bilateral lower extremity venous stasis.  She has noticed leg swelling for years.  This is worse in the left leg.  She has also noticed significant skin discoloration that has been progressing.  She had the same problem in her mother.  Does not wear compression stockings.  Sits at a desk and notices her swelling is worse throughout the day.  No history of DVT.  No previous interventions.  Past Medical History:  Diagnosis Date   Hypertension    Iron deficiency anemia due to chronic blood loss 10/31/2017   Iron deficiency anemia, unspecified    Menometrorrhagia 10/31/2017   Obesity     Past Surgical History:  Procedure Laterality Date   CESAREAN SECTION  01/2001, 05/2004    x 2   COLONOSCOPY WITH PROPOFOL N/A 03/29/2022   Procedure: COLONOSCOPY WITH PROPOFOL;  Surgeon: Kathi DerBrahmbhatt, Parag, MD;  Location: [facility];  Service: Gastroenterology;  Laterality: N/A;   HYSTEROSCOPY WITH NOVASURE N/A 02/06/2014   Procedure: HYSTEROSCOPY WITH NOVASURE;  Surgeon: Oliver PilaKathy W Richardson, MD;  Location: [facility];  Service: Gynecology;  Laterality: N/A;  1hr OR time   POLYPECTOMY  03/29/2022   Procedure: POLYPECTOMY;  Surgeon: Kathi DerBrahmbhatt, Parag, MD;  Location: [facility];  Service: Gastroenterology;;   TUBAL LIGATION  2005   WISDOM TOOTH EXTRACTION  1988    Family History  Problem Relation Age of Onset   Diabetes Mother    Hyperlipidemia Father    Hypertension Father     SOCIAL HISTORY: Social History   Socioeconomic History   Marital status: Divorced    Spouse name: Not on file   Number of children: Not on file   Years of education: Not on file   Highest education level: Not on file  Occupational History   Not on file  Tobacco Use   Smoking status: Never    Smokeless tobacco: Never  Vaping Use   Vaping Use: Never used  Substance and Sexual Activity   Alcohol use: No   Drug use: No   Sexual activity: Not Currently    Birth control/protection: Surgical  Other Topics Concern   Not on file  Social History Narrative   Not on file   Social Determinants of Health   Financial Resource Strain: Not on file  Food Insecurity: Not on file  Transportation Needs: Not on file  Physical Activity: Not on file  Stress: Not on file  Social Connections: Not on file  Intimate Partner Violence: Not on file    Allergies  Allergen Reactions   Shellfish Allergy Hives    Current Outpatient Medications  Medication Sig Dispense Refill   ketoconazole (NIZORAL) 2 % cream Apply 1 Application topically daily. 60 g 0   Multiple Vitamins-Minerals (MULTIVITAMIN WITH MINERALS) tablet Take 2 tablets by mouth daily.     Semaglutide (RYBELSUS) 3 MG TABS Take 3 mg by mouth daily. 30 tablet 3   telmisartan-hydrochlorothiazide (MICARDIS HCT) 80-25 MG tablet Take 1 tablet by mouth daily. 90 tablet 2   No current facility-administered medications for this visit.    REVIEW OF SYSTEMS:  [X]  denotes positive finding, [ ]  denotes negative finding Cardiac  Comments:  Chest pain or chest pressure:    Shortness of breath upon exertion:  Short of breath when lying flat:    Irregular heart rhythm:        Vascular    Pain in calf, thigh, or hip brought on by ambulation:    Pain in feet at night that wakes you up from your sleep:     Blood clot in your veins:    Leg swelling:  x       Pulmonary    Oxygen at home:    Productive cough:     Wheezing:         Neurologic    Sudden weakness in arms or legs:     Sudden numbness in arms or legs:     Sudden onset of difficulty speaking or slurred speech:    Temporary loss of vision in one eye:     Problems with dizziness:         Gastrointestinal    Blood in stool:     Vomited blood:         Genitourinary     Burning when urinating:     Blood in urine:        Psychiatric    Major depression:         Hematologic    Bleeding problems:    Problems with blood clotting too easily:        Skin    Rashes or ulcers:        Constitutional    Fever or chills:      PHYSICAL EXAM: There were no vitals filed for this visit.  GENERAL: The patient is a well-nourished female, in no acute distress. The vital signs are documented above. CARDIAC: There is a regular rate and rhythm.  VASCULAR:  Palpable femoral pulses bilaterally Palpable DP pulses bilaterally Bilateral lower extremity skin changes with lipodermatosclerosis and skin pigmentation PULMONARY: No respiratory distress. ABDOMEN: Soft and non-tender. MUSCULOSKELETAL: There are no major deformities or cyanosis. NEUROLOGIC: No focal weakness or paresthesias are detected. PSYCHIATRIC: The patient has a normal affect.  DATA:   Lower Venous Reflux Study   Patient Name:  Ashley Harrison  Date of Exam:   07/19/2022  Medical Rec #: [id number]        Accession #:    [id number]  Date of Birth: [birth year]       Patient Gender: F  Patient Age:   49 years  Exam Location:  [facility]  Procedure:      VAS [place] LOWER EXTREMITY VENOUS REFLUX  Referring Phys: Ivin Booty ROBINS    ---------------------------------------------------------------------------  -----    Indications: Edema.    Risk Factors: OSA and obesity.  Limitations: Body habitus.  Performing Technologist: Lowell Guitar RVT, RDMS     Examination Guidelines: A complete evaluation includes B-mode imaging,  spectral  Doppler, color Doppler, and power Doppler as needed of all accessible  portions  of each vessel. Bilateral testing is considered an integral part of a  complete  examination. Limited examinations for reoccurring indications may be  performed  as noted. The reflux portion of the exam is performed with the patient in  reverse Trendelenburg.   Significant venous reflux is defined as >500 ms in the superficial venous  system, and >1 second in the deep venous system.     +--------------+---------+------+-----------+------------+--------+  LEFT         Reflux NoRefluxReflux TimeDiameter cmsComments  Yes                                   +--------------+---------+------+-----------+------------+--------+  CFV                    yes   >1 second                       +--------------+---------+------+-----------+------------+--------+  FV prox       no                                              +--------------+---------+------+-----------+------------+--------+  FV mid        no                                              +--------------+---------+------+-----------+------------+--------+  FV dist       no                                              +--------------+---------+------+-----------+------------+--------+  Popliteal    no                                              +--------------+---------+------+-----------+------------+--------+  GSV at SFJ              yes    >500 ms      0.70              +--------------+---------+------+-----------+------------+--------+  GSV prox thigh          yes    >500 ms      0.85              +--------------+---------+------+-----------+------------+--------+  GSV mid thigh           yes    >500 ms      0.72              +--------------+---------+------+-----------+------------+--------+  GSV dist thigh          yes    >500 ms      0.63              +--------------+---------+------+-----------+------------+--------+  GSV at knee   no                            0.53              +--------------+---------+------+-----------+------------+--------+  GSV prox calf no                            0.45              +--------------+---------+------+-----------+------------+--------+   SSV prox calf no                            0.29              +--------------+---------+------+-----------+------------+--------+  SSV mid calf            yes    >500 ms      0.19              +--------------+---------+------+-----------+------------+--------+      Summary:  Left:  - No evidence of deep vein thrombosis seen in the left lower extremity,  from the common femoral through the popliteal veins.  - No evidence of superficial venous thrombosis in the left lower  extremity.    - Venous reflux is noted in the left common femoral vein.  - Venous reflux is noted in the left sapheno-femoral junction.  - Venous reflux is noted in the left greater saphenous vein in the thigh.  - Venous reflux is noted in the left short saphenous vein.    *See table(s) above for measurements and observations.   Assessment/Plan:  51 year old female presents for evaluation of bilateral lower extremity venous insufficiency.  This is much worse in her left leg where she has more prominent swelling.  I reviewed her reflux study that does show evidence of chronic venous insufficiency with valvular reflux.  I discussed etiology of venous disease in detail.  I discussed conservative measures with leg elevation, compression, exercise and weight loss.  Her BMI is 58 with a weight of 328 pounds and I did offer her referral to a bariatric surgeon but she has no interest.  She has significant superficial reflux in the left great saphenous vein throughout the thigh.  I will have her return in 3 months to see one of my partners but again discussed weight loss may be her best option for most significant benefit.  We did size her for knee high medical grade compression stockings today.   Cephus Shelling, MD [facility] Office: [phone]

## 2022-07-20 ENCOUNTER — Encounter: Payer: Self-pay | Admitting: Podiatry

## 2022-07-25 ENCOUNTER — Encounter: Payer: Self-pay | Admitting: Podiatry

## 2022-07-26 ENCOUNTER — Ambulatory Visit (INDEPENDENT_AMBULATORY_CARE_PROVIDER_SITE_OTHER): Payer: 59

## 2022-07-26 ENCOUNTER — Ambulatory Visit: Payer: 59 | Admitting: Podiatry

## 2022-07-26 DIAGNOSIS — M79672 Pain in left foot: Secondary | ICD-10-CM

## 2022-07-26 DIAGNOSIS — Z79899 Other long term (current) drug therapy: Secondary | ICD-10-CM

## 2022-07-26 DIAGNOSIS — M722 Plantar fascial fibromatosis: Secondary | ICD-10-CM

## 2022-07-26 MED ORDER — EFINACONAZOLE 10 % EX SOLN: 1.0000 [drp] | Freq: Every day | CUTANEOUS | 11 refills | Status: DC

## 2022-07-26 MED ORDER — TERBINAFINE HCL 250 MG PO TABS: 250.0000 mg | ORAL_TABLET | Freq: Every day | ORAL | 0 refills | Status: DC

## 2022-07-26 NOTE — Progress Notes (Unsigned)
Subjective: She has been getting arch pain on the left side for the last few days. Only thing that has changed is she started wearing compression socks. No injury. It hurts when she starts walking a lot it hurt. No increased swelling compared to normal.   The ketoconazole cream has been helping.   Also presents today for nail culture results.   Objective: AAO x3, NAD DP/PT pulses palpable bilaterally, CRT less than 3 seconds Protective sensation intact with Simms Weinstein monofilament, vibratory sensation intact, Achilles tendon reflex intact No areas of pinpoint bony tenderness or pain with vibratory sensation. MMT 5/5, ROM WNL. No edema, erythema, increase in warmth to bilateral lower extremities.  No open lesions or pre-ulcerative lesions.  No pain with calf compression, swelling, warmth, erythema  Assessment:  Plan: -All treatment options discussed with the patient including all alternatives, risks, complications.   -Patient encouraged to call the office with any questions, concerns, change in symptoms.

## 2022-07-26 NOTE — Patient Instructions (Signed)
Terbinafine Tablets What is this medication? TERBINAFINE (TER bin a feen) treats fungal infections of the nails. It belongs to a group of medications called antifungals. It will not treat infections caused by bacteria or viruses. This medicine may be used for other purposes; ask your health care provider or pharmacist if you have questions. COMMON BRAND NAME(S): Lamisil, Terbinex What should I tell my care team before I take this medication? They need to know if you have any of these conditions: Liver disease An unusual or allergic reaction to terbinafine, other medications, foods, dyes, or preservatives Pregnant or trying to get pregnant Breast-feeding How should I use this medication? Take this medication by mouth with water. Take it as directed on the prescription label at the same time every day. You can take it with or without food. If it upsets your stomach, take it with food. Keep taking it unless your care team tells you to stop. A special MedGuide will be given to you by the pharmacist with each prescription and refill. Be sure to read this information carefully each time. Talk to your care team regarding the use of this medication in children. Special care may be needed. Overdosage: If you think you have taken too much of this medicine contact a poison control center or emergency room at once. NOTE: This medicine is only for you. Do not share this medicine with others. What if I miss a dose? If you miss a dose, take it as soon as you can unless it is more than 4 hours late. If it is more than 4 hours late, skip the missed dose. Take the next dose at the normal time. What may interact with this medication? Do not take this medication with any of the following: Pimozide Thioridazine This medication may also interact with the following: Beta blockers Caffeine Certain medications for mental health conditions Cimetidine Cyclosporine Medications for fungal infections like fluconazole  and ketoconazole Medications for irregular heartbeat like amiodarone, flecainide and propafenone Rifampin Warfarin This list may not describe all possible interactions. Give your health care provider a list of all the medicines, herbs, non-prescription drugs, or dietary supplements you use. Also tell them if you smoke, drink alcohol, or use illegal drugs. Some items may interact with your medicine. What should I watch for while using this medication? Visit your care team for regular checks on your progress. You may need blood work while you are taking this medication. It may be some time before you see the benefit from this medication. This medication may cause serious skin reactions. They can happen weeks to months after starting the medication. Contact your care team right away if you notice fevers or flu-like symptoms with a rash. The rash may be red or purple and then turn into blisters or peeling of the skin. Or, you might notice a red rash with swelling of the face, lips or lymph nodes in your neck or under your arms. This medication can make you more sensitive to the sun. Keep out of the sun, If you cannot avoid being in the sun, wear protective clothing and sunscreen. Do not use sun lamps or tanning beds/booths. What side effects may I notice from receiving this medication? Side effects that you should report to your care team as soon as possible: Allergic reactions--skin rash, itching, hives, swelling of the face, lips, tongue, or throat Change in sense of smell Change in taste Infection--fever, chills, cough, or sore throat Liver injury--right upper belly pain, loss of appetite, nausea,   light-colored stool, dark yellow or brown urine, yellowing skin or eyes, unusual weakness or fatigue Low red blood cell level--unusual weakness or fatigue, dizziness, headache, trouble breathing Lupus-like syndrome--joint pain, swelling, or stiffness, butterfly-shaped rash on the face, rashes that get worse  in the sun, fever, unusual weakness or fatigue Rash, fever, and swollen lymph nodes Redness, blistering, peeling, or loosening of the skin, including inside the mouth Unusual bruising or bleeding Worsening mood, feelings of depression Side effects that usually do not require medical attention (report to your care team if they continue or are bothersome): Diarrhea Gas Headache Nausea Stomach pain Upset stomach This list may not describe all possible side effects. Call your doctor for medical advice about side effects. You may report side effects to FDA at 1-800-FDA-1088. Where should I keep my medication? Keep out of the reach of children and pets. Store between 20 and 25 degrees C (68 and 77 degrees F). Protect from light. Get rid of any unused medication after the expiration date. To get rid of medications that are no longer needed or have expired: Take the medication to a medication take-back program. Check with your pharmacy or law enforcement to find a location. If you cannot return the medication, check the label or package insert to see if the medication should be thrown out in the garbage or flushed down the toilet. If you are not sure, ask your care team. If it is safe to put it in the trash, take the medication out of the container. Mix the medication with cat litter, dirt, coffee grounds, or other unwanted substance. Seal the mixture in a bag or container. Put it in the trash. NOTE: This sheet is a summary. It may not cover all possible information. If you have questions about this medicine, talk to your doctor, pharmacist, or health care provider.  2023 Elsevier/Gold Standard (2021-03-09 00:00:00)  

## 2022-07-29 ENCOUNTER — Inpatient Hospital Stay: Payer: 59 | Attending: Family

## 2022-07-29 ENCOUNTER — Other Ambulatory Visit: Payer: Self-pay | Admitting: Podiatry

## 2022-07-29 VITALS — BP 153/93 | HR 68 | Temp 98.4°F | Resp 17

## 2022-07-29 DIAGNOSIS — Z79899 Other long term (current) drug therapy: Secondary | ICD-10-CM

## 2022-07-29 DIAGNOSIS — M79672 Pain in left foot: Secondary | ICD-10-CM

## 2022-07-29 DIAGNOSIS — D509 Iron deficiency anemia, unspecified: Secondary | ICD-10-CM | POA: Insufficient documentation

## 2022-07-29 DIAGNOSIS — M722 Plantar fascial fibromatosis: Secondary | ICD-10-CM

## 2022-07-29 DIAGNOSIS — D508 Other iron deficiency anemias: Secondary | ICD-10-CM

## 2022-07-29 DIAGNOSIS — D500 Iron deficiency anemia secondary to blood loss (chronic): Secondary | ICD-10-CM

## 2022-07-29 DIAGNOSIS — N921 Excessive and frequent menstruation with irregular cycle: Secondary | ICD-10-CM

## 2022-07-29 MED ORDER — SODIUM CHLORIDE 0.9 % IV SOLN: Freq: Once | INTRAVENOUS | Status: AC

## 2022-07-29 MED ORDER — SODIUM CHLORIDE 0.9 % IV SOLN
300.0000 mg | Freq: Once | INTRAVENOUS | Status: AC
  Administered 2022-07-29: 300 mg via INTRAVENOUS
  Filled 2022-07-29: qty 300

## 2022-07-29 NOTE — Progress Notes (Signed)
Pt declined to stay for post infusion observation period. Pt stated she has tolerated medication multiple times prior without difficulty. Pt aware to call clinic with any questions or concerns. Pt verbalized understanding and had no further questions.  ? ?

## 2022-07-29 NOTE — Patient Instructions (Signed)

## 2022-08-03 ENCOUNTER — Encounter: Payer: Self-pay | Admitting: Emergency Medicine

## 2022-08-03 ENCOUNTER — Ambulatory Visit
Admission: EM | Admit: 2022-08-03 | Discharge: 2022-08-03 | Disposition: A | Discharge status: Home / Self Care | Payer: 59 | Attending: Internal Medicine | Admitting: Internal Medicine

## 2022-08-03 DIAGNOSIS — J069 Acute upper respiratory infection, unspecified: Secondary | ICD-10-CM

## 2022-08-03 DIAGNOSIS — R030 Elevated blood-pressure reading, without diagnosis of hypertension: Secondary | ICD-10-CM | POA: Diagnosis not present

## 2022-08-03 MED ORDER — FLUTICASONE PROPIONATE 50 MCG/ACT NA SUSP: 1.0000 | Freq: Every day | NASAL | 0 refills | Status: DC

## 2022-08-03 MED ORDER — BENZONATATE 100 MG PO CAPS: 100.0000 mg | ORAL_CAPSULE | Freq: Three times a day (TID) | ORAL | 0 refills | Status: DC | PRN

## 2022-08-03 MED ORDER — CETIRIZINE HCL 10 MG PO TABS: 10.0000 mg | ORAL_TABLET | Freq: Every day | ORAL | 0 refills | Status: DC

## 2022-08-03 NOTE — Discharge Instructions (Addendum)
You have a viral upper respiratory infection which should run its course and self resolve with symptomatic treatment as we discussed.  I have prescribed you 3 medications to help alleviate symptoms.  Please call your primary care doctor today to schedule an appointment for further evaluation and management of your elevated blood pressure reading.  I also recommend that you monitor very closely at home over the next 24 to 48 hours.  If it remains elevated, please follow-up with urgent care or emergency department if unable to get in with PCP.

## 2022-08-03 NOTE — ED Provider Notes (Signed)
[name]    CSN: [id number] Arrival date & time: 08/03/22  5465      History   Chief Complaint Chief Complaint  Patient presents with   Cough   Otalgia    Nasal consgestion    HPI Ashley Harrison is a 51 y.o. female.   Patient presents with nasal congestion, cough, bilateral ear pain.  Nasal congestion and cough started about 6 days ago while ear pain started about 2 days ago.  Patient denies any known sick contacts or fevers at home.  Denies chest pain, shortness of breath, sore throat, ear pain, nausea, vomiting, diarrhea, abdominal pain.  Patient has taken Mucinex over-the-counter with minimal improvement in symptoms.  Patient denies formal diagnosis of asthma or COPD.  Patient has significantly elevated blood pressure reading and reports that she has been taking her blood pressure medication as prescribed.  She denies headache, dizziness, nausea, vomiting, blurred vision.  Patient reports that her last several visits by PCP or at healthcare offices have been elevated.  She states that her PCP has not modified any medications or expressed concern about it.   Cough Otalgia   Past Medical History:  Diagnosis Date   Hypertension    Iron deficiency anemia due to chronic blood loss 10/31/2017   Iron deficiency anemia, unspecified    Menometrorrhagia 10/31/2017   Obesity     Patient Active Problem List   Diagnosis Date Noted   Chronic venous insufficiency 07/19/2022   Insulin resistance 12/13/2021   OSA on CPAP 06/19/2019   Fatigue due to sleep pattern disturbance 03/31/2019   Essential hypertension, benign 10/09/2018   Snoring 10/09/2018   Class 3 severe obesity due to excess calories with body mass index (BMI) of 60.0 to 69.9 in adult (HCC) 10/09/2018   Morbid obesity due to excess calories (HCC) 06/05/2018   Iron deficiency anemia due to chronic blood loss 10/31/2017   Menometrorrhagia 10/31/2017   Anemia 09/21/2011    Past Surgical History:  Procedure  Laterality Date   CESAREAN SECTION  01/2001, 05/2004    x 2   COLONOSCOPY WITH PROPOFOL N/A 03/29/2022   Procedure: COLONOSCOPY WITH PROPOFOL;  Surgeon: Kathi Der, MD;  Location: [facility];  Service: Gastroenterology;  Laterality: N/A;   HYSTEROSCOPY WITH NOVASURE N/A 02/06/2014   Procedure: HYSTEROSCOPY WITH NOVASURE;  Surgeon: Oliver Pila, MD;  Location: [facility];  Service: Gynecology;  Laterality: N/A;  1hr OR time   POLYPECTOMY  03/29/2022   Procedure: POLYPECTOMY;  Surgeon: Kathi Der, MD;  Location: [facility];  Service: Gastroenterology;;   TUBAL LIGATION  2005   WISDOM TOOTH EXTRACTION  1988    OB History   No obstetric history on file.      Home Medications    Prior to Admission medications   Medication Sig Start Date End Date Taking? Authorizing Provider  benzonatate (TESSALON) 100 MG capsule Take 1 capsule (100 mg total) by mouth every 8 (eight) hours as needed for cough. 08/03/22  Yes Shamikia Linskey, Acie Fredrickson, FNP  cetirizine (ZYRTEC) 10 MG tablet Take 1 tablet (10 mg total) by mouth daily. 08/03/22  Yes Montrel Donahoe, Rolly Salter E, FNP  fluticasone (FLONASE) 50 MCG/ACT nasal spray Place 1 spray into both nostrils daily. 08/03/22  Yes Thressa Shiffer, Rolly Salter E, FNP  Efinaconazole 10 % SOLN Apply 1 drop topically daily. 07/26/22   Vivi Barrack, DPM  ketoconazole (NIZORAL) 2 % cream Apply 1 Application topically daily. 06/28/22   Vivi Barrack, DPM  Multiple Vitamins-Minerals (  MULTIVITAMIN WITH MINERALS) tablet Take 2 tablets by mouth daily.    [provider]  Semaglutide (RYBELSUS) 3 MG TABS Take 3 mg by mouth daily. 12/13/21   Dorothyann PengSanders, Robyn, MD  telmisartan-hydrochlorothiazide (MICARDIS HCT) 80-25 MG tablet Take 1 tablet by mouth daily. 03/15/22   Dorothyann PengSanders, Robyn, MD  terbinafine (LAMISIL) 250 MG tablet Take 1 tablet (250 mg total) by mouth daily. 07/26/22   Vivi BarrackWagoner, Matthew R, DPM    Family History Family History  Problem Relation Age of Onset   Diabetes Mother     Hyperlipidemia Father    Hypertension Father     Social History Social History   Tobacco Use   Smoking status: Never   Smokeless tobacco: Never  Vaping Use   Vaping Use: Never used  Substance Use Topics   Alcohol use: No   Drug use: No     Allergies   Shellfish allergy   Review of Systems Review of Systems Per HPI  Physical Exam Triage Vital Signs ED Triage Vitals  Enc Vitals Group     BP 08/03/22 0952 (!) 198/96     Pulse Rate 08/03/22 0952 61     Resp 08/03/22 0952 17     Temp 08/03/22 0952 97.7 F (36.5 C)     Temp src --      SpO2 08/03/22 0952 95 %     Weight --      Height --      Head Circumference --      Peak Flow --      Pain Score 08/03/22 0953 3     Pain Loc --      Pain Edu? --      Excl. in GC? --    No data found.  Updated Vital Signs BP (!) 198/96   Pulse 61   Temp 97.7 F (36.5 C)   Resp 17   SpO2 95%   Visual Acuity Right Eye Distance:   Left Eye Distance:   Bilateral Distance:    Right Eye Near:   Left Eye Near:    Bilateral Near:     Physical Exam Constitutional:      General: She is not in acute distress.    Appearance: Normal appearance. She is not toxic-appearing or diaphoretic.  HENT:     Head: Normocephalic and atraumatic.     Right Ear: Ear canal normal. No drainage, swelling or tenderness. A middle ear effusion is present. Tympanic membrane is not perforated, erythematous or bulging.     Left Ear: Ear canal normal. No drainage, swelling or tenderness. A middle ear effusion is present. Tympanic membrane is not perforated, erythematous or bulging.     Nose: Congestion present.     Mouth/Throat:     Mouth: Mucous membranes are moist.     Pharynx: Posterior oropharyngeal erythema present.  Eyes:     Extraocular Movements: Extraocular movements intact.     Conjunctiva/sclera: Conjunctivae normal.     Pupils: Pupils are equal, round, and reactive to light.  Cardiovascular:     Rate and Rhythm: Normal rate and  regular rhythm.     Pulses: Normal pulses.     Heart sounds: Normal heart sounds.  Pulmonary:     Effort: Pulmonary effort is normal. No respiratory distress.     Breath sounds: Normal breath sounds. No stridor. No wheezing, rhonchi or rales.  Abdominal:     General: Abdomen is flat. Bowel sounds are normal.     Palpations: Abdomen  is soft.  Musculoskeletal:        General: Normal range of motion.     Cervical back: Normal range of motion.  Skin:    General: Skin is warm and dry.  Neurological:     General: No focal deficit present.     Mental Status: She is alert and oriented to person, place, and time. Mental status is at baseline.     Cranial Nerves: Cranial nerves 2-12 are intact.     Sensory: Sensation is intact.     Motor: Motor function is intact.     Coordination: Coordination is intact.     Gait: Gait is intact.  Psychiatric:        Mood and Affect: Mood normal.        Behavior: Behavior normal.      UC Treatments / Results  Labs (all labs ordered are listed, but only abnormal results are displayed) Labs Reviewed - No data to display  EKG   Radiology No results found.  Procedures Procedures (including critical care time)  Medications Ordered in UC Medications - No data to display  Initial Impression / Assessment and Plan / UC Course  I have reviewed the triage vital signs and the nursing notes.  Pertinent labs & imaging results that were available during my care of the patient were reviewed by me and considered in my medical decision making (see chart for details).     Patient presents with symptoms likely from a viral upper respiratory infection. Differential includes bacterial pneumonia, sinusitis, allergic rhinitis, COVID-19, flu, RSV. Do not suspect underlying cardiopulmonary process. Symptoms seem unlikely related to ACS, CHF or COPD exacerbations, pneumonia, pneumothorax. Patient is nontoxic appearing and not in need of emergent medical  intervention.  Patient declined viral testing for COVID given duration of symptoms as it would not change treatment.  Recommended symptom control with medications and symptomatic treatment.  Patient present prescriptions to help alleviate symptoms.  Patient has elevated blood pressure reading with recheck being similar.  Patient's neuroexam is normal and is currently asymptomatic regarding blood pressure.  After further review of patient's chart, it appears that this is consistent with recent blood pressure checks.  Therefore, do not think that emergent evaluation is necessary.  Although, patient was advised to follow-up with her PCP as soon as possible and to closely monitor blood pressure at home with her home blood pressure cuff.  She was given strict return and ER precautions regarding blood pressure.  Return if symptoms fail to improve. Patient states understanding and is agreeable.  Discharged with PCP followup.  Final Clinical Impressions(s) / UC Diagnoses   Final diagnoses:  Viral upper respiratory tract infection with cough  Elevated blood pressure reading     Discharge Instructions      You have a viral upper respiratory infection which should run its course and self resolve with symptomatic treatment as we discussed.  I have prescribed you 3 medications to help alleviate symptoms.  Please call your primary care doctor today to schedule an appointment for further evaluation and management of your elevated blood pressure reading.  I also recommend that you monitor very closely at home over the next 24 to 48 hours.  If it remains elevated, please follow-up with urgent care or emergency department if unable to get in with PCP.    ED Prescriptions     Medication Sig Dispense Auth. Provider   fluticasone (FLONASE) 50 MCG/ACT nasal spray Place 1 spray into both nostrils daily.  16 g Ervin Knack E, [place]   cetirizine (ZYRTEC) 10 MG tablet Take 1 tablet (10 mg total) by mouth daily. 30  tablet [place], [place] E, [place]   benzonatate (TESSALON) 100 MG capsule Take 1 capsule (100 mg total) by mouth every 8 (eight) hours as needed for cough. 21 capsule [place], Acie Fredrickson, [place]      PDMP not reviewed this encounter.   Gustavus Bryant, [place] 08/03/22 [phone]

## 2022-08-03 NOTE — ED Triage Notes (Signed)
Pt is present today with c/o nasal congestion,. Cough, and bilateral ear pain one week ago

## 2022-08-09 ENCOUNTER — Ambulatory Visit: Payer: 59

## 2022-08-09 VITALS — BP 130/80 | HR 98 | Temp 98.4°F

## 2022-08-09 DIAGNOSIS — I10 Essential (primary) hypertension: Secondary | ICD-10-CM

## 2022-08-09 NOTE — Progress Notes (Signed)
Patient presents today for BPC. She is currently taking temisartan-hctz 80-25mg .   BP Readings from Last 3 Encounters:  08/09/22 130/80  08/03/22 (!) 198/96  07/29/22 (!) 153/93

## 2022-08-09 NOTE — Patient Instructions (Signed)
Hypertension, Adult High blood pressure (hypertension) is when the force of blood pumping through the arteries is too strong. The arteries are the blood vessels that carry blood from the heart throughout the body. Hypertension forces the heart to work harder to pump blood and may cause arteries to become narrow or stiff. Untreated or uncontrolled hypertension can lead to a heart attack, heart failure, a stroke, kidney disease, and other problems. A blood pressure reading consists of a higher number over a lower number. Ideally, your blood pressure should be below 120/80. The first ("top") number is called the systolic pressure. It is a measure of the pressure in your arteries as your heart beats. The second ("bottom") number is called the diastolic pressure. It is a measure of the pressure in your arteries as the heart relaxes. What are the causes? The exact cause of this condition is not known. There are some conditions that result in high blood pressure. What increases the risk? Certain factors may make you more likely to develop high blood pressure. Some of these risk factors are under your control, including: Smoking. Not getting enough exercise or physical activity. Being overweight. Having too much fat, sugar, calories, or salt (sodium) in your diet. Drinking too much alcohol. Other risk factors include: Having a personal history of heart disease, diabetes, high cholesterol, or kidney disease. Stress. Having a family history of high blood pressure and high cholesterol. Having obstructive sleep apnea. Age. The risk increases with age. What are the signs or symptoms? High blood pressure may not cause symptoms. Very high blood pressure (hypertensive crisis) may cause: Headache. Fast or irregular heartbeats (palpitations). Shortness of breath. Nosebleed. Nausea and vomiting. Vision changes. Severe chest pain, dizziness, and seizures. How is this diagnosed? This condition is diagnosed by  measuring your blood pressure while you are seated, with your arm resting on a flat surface, your legs uncrossed, and your feet flat on the floor. The cuff of the blood pressure monitor will be placed directly against the skin of your upper arm at the level of your heart. Blood pressure should be measured at least twice using the same arm. Certain conditions can cause a difference in blood pressure between your right and left arms. If you have a high blood pressure reading during one visit or you have normal blood pressure with other risk factors, you may be asked to: Return on a different day to have your blood pressure checked again. Monitor your blood pressure at home for 1 week or longer. If you are diagnosed with hypertension, you may have other blood or imaging tests to help your health care provider understand your overall risk for other conditions. How is this treated? This condition is treated by making healthy lifestyle changes, such as eating healthy foods, exercising more, and reducing your alcohol intake. You may be referred for counseling on a healthy diet and physical activity. Your health care provider may prescribe medicine if lifestyle changes are not enough to get your blood pressure under control and if: Your systolic blood pressure is above 130. Your diastolic blood pressure is above 80. Your personal target blood pressure may vary depending on your medical conditions, your age, and other factors. Follow these instructions at home: Eating and drinking  Eat a diet that is high in fiber and potassium, and low in sodium, added sugar, and fat. An example of this eating plan is called the DASH diet. DASH stands for Dietary Approaches to Stop Hypertension. To eat this way: Eat   plenty of fresh fruits and vegetables. Try to fill one half of your plate at each meal with fruits and vegetables. Eat whole grains, such as whole-wheat pasta, brown rice, or whole-grain bread. Fill about one  fourth of your plate with whole grains. Eat or drink low-fat dairy products, such as skim milk or low-fat yogurt. Avoid fatty cuts of meat, processed or cured meats, and poultry with skin. Fill about one fourth of your plate with lean proteins, such as fish, chicken without skin, beans, eggs, or tofu. Avoid pre-made and processed foods. These tend to be higher in sodium, added sugar, and fat. Reduce your daily sodium intake. Many people with hypertension should eat less than 1,500 mg of sodium a day. Do not drink alcohol if: Your health care provider tells you not to drink. You are pregnant, may be pregnant, or are planning to become pregnant. If you drink alcohol: Limit how much you have to: 0-1 drink a day for women. 0-2 drinks a day for men. Know how much alcohol is in your drink. In the [place] one drink equals one 12 oz bottle of beer (355 mL), one 5 oz glass of wine (148 mL), or one 1 oz glass of hard liquor (44 mL). Lifestyle  Work with your health care provider to maintain a healthy body weight or to lose weight. Ask what an ideal weight is for you. Get at least 30 minutes of exercise that causes your heart to beat faster (aerobic exercise) most days of the week. Activities may include walking, swimming, or biking. Include exercise to strengthen your muscles (resistance exercise), such as Pilates or lifting weights, as part of your weekly exercise routine. Try to do these types of exercises for 30 minutes at least 3 days a week. Do not use any products that contain nicotine or tobacco. These products include cigarettes, chewing tobacco, and vaping devices, such as e-cigarettes. If you need help quitting, ask your health care provider. Monitor your blood pressure at home as told by your health care provider. Keep all follow-up visits. This is important. Medicines Take over-the-counter and prescription medicines only as told by your health care provider. Follow directions carefully. Blood  pressure medicines must be taken as prescribed. Do not skip doses of blood pressure medicine. Doing this puts you at risk for problems and can make the medicine less effective. Ask your health care provider about side effects or reactions to medicines that you should watch for. Contact a health care provider if you: Think you are having a reaction to a medicine you are taking. Have headaches that keep coming back (recurring). Feel dizzy. Have swelling in your ankles. Have trouble with your vision. Get help right away if you: Develop a severe headache or confusion. Have unusual weakness or numbness. Feel faint. Have severe pain in your chest or abdomen. Vomit repeatedly. Have trouble breathing. These symptoms may be an emergency. Get help right away. Call 911. Do not wait to see if the symptoms will go away. Do not drive yourself to the hospital. Summary Hypertension is when the force of blood pumping through your arteries is too strong. If this condition is not controlled, it may put you at risk for serious complications. Your personal target blood pressure may vary depending on your medical conditions, your age, and other factors. For most people, a normal blood pressure is less than 120/80. Hypertension is treated with lifestyle changes, medicines, or a combination of both. Lifestyle changes include losing weight, eating a healthy,   low-sodium diet, exercising more, and limiting alcohol. This information is not intended to replace advice given to you by your health care provider. Make sure you discuss any questions you have with your health care provider. Document Revised: 06/22/2021 Document Reviewed: 06/22/2021 Elsevier Patient Education  2023 [employer].  

## 2022-08-13 ENCOUNTER — Ambulatory Visit
Admission: EM | Admit: 2022-08-13 | Discharge: 2022-08-13 | Disposition: A | Discharge status: Home / Self Care | Payer: 59 | Attending: Internal Medicine | Admitting: Internal Medicine

## 2022-08-13 DIAGNOSIS — N3001 Acute cystitis with hematuria: Secondary | ICD-10-CM | POA: Insufficient documentation

## 2022-08-13 DIAGNOSIS — R300 Dysuria: Secondary | ICD-10-CM | POA: Diagnosis present

## 2022-08-13 DIAGNOSIS — R350 Frequency of micturition: Secondary | ICD-10-CM | POA: Insufficient documentation

## 2022-08-13 LAB — POCT URINALYSIS DIP (MANUAL ENTRY)
Bilirubin, UA: NEGATIVE
Glucose, UA: 100 mg/dL — AB
Ketones, POC UA: NEGATIVE mg/dL
Nitrite, UA: POSITIVE — AB
Protein Ur, POC: 100 mg/dL — AB
Spec Grav, UA: >=1.03 — AB (ref 1.010–1.025)
Urobilinogen, UA: 1 E.U./dL
pH, UA: 6 (ref 5.0–8.0)

## 2022-08-13 MED ORDER — CEFTRIAXONE SODIUM 1 G IJ SOLR
1.0000 g | Freq: Once | INTRAMUSCULAR | Status: AC
  Administered 2022-08-13: 1 g via INTRAMUSCULAR

## 2022-08-13 MED ORDER — SULFAMETHOXAZOLE-TRIMETHOPRIM 800-160 MG PO TABS: 1.0000 | ORAL_TABLET | Freq: Two times a day (BID) | ORAL | 0 refills | Status: AC

## 2022-08-13 NOTE — Discharge Instructions (Signed)
It appears that you have a urinary tract infection.  I am treating with antibiotic injection and antibiotic pills that have been sent to the pharmacy.  Urine culture is pending.  Will call with results.  Please follow-up if symptoms persist or worsen.

## 2022-08-13 NOTE — ED Triage Notes (Signed)
Pt presents with co of dysuria, back pain, urinary frequency for one day, pt is co of uti. Pt reports otc azo.

## 2022-08-13 NOTE — ED Provider Notes (Signed)
[name]    CSN: [id number] Arrival date & time: 08/13/22  [id number]      History   Chief Complaint Chief Complaint  Patient presents with   Dysuria    HPI Ashley Harrison is a 51 y.o. female.   Patient presents with urinary burning, urinary frequency, low back pain that started yesterday. Low back pain is present in mid to right lower back. Denies fever, hematuria, vaginal discharge, abdominal pain, abnormal vaginal bleeding.  Patient has taken Azo for symptoms with minimal improvement.  Patient reports that her urinary tract infections typically occur around her menstrual cycle at times.  Last menstrual cycle is unknown but patient reports that she has irregular menstrual cycles at baseline.  Patient has elevated blood pressure reading but reports that she has not taken her blood pressure medication today.  She is asymptomatic regarding blood pressure.   Dysuria   Past Medical History:  Diagnosis Date   Hypertension    Iron deficiency anemia due to chronic blood loss 10/31/2017   Iron deficiency anemia, unspecified    Menometrorrhagia 10/31/2017   Obesity     Patient Active Problem List   Diagnosis Date Noted   Chronic venous insufficiency 07/19/2022   Insulin resistance 12/13/2021   OSA on CPAP 06/19/2019   Fatigue due to sleep pattern disturbance 03/31/2019   Essential hypertension, benign 10/09/2018   Snoring 10/09/2018   Class 3 severe obesity due to excess calories with body mass index (BMI) of 60.0 to 69.9 in adult ([place]) 10/09/2018   Morbid obesity due to excess calories ([place]) 06/05/2018   Iron deficiency anemia due to chronic blood loss 10/31/2017   Menometrorrhagia 10/31/2017   Anemia 09/21/2011    Past Surgical History:  Procedure Laterality Date   CESAREAN SECTION  01/2001, 05/2004    x 2   COLONOSCOPY WITH PROPOFOL N/A 03/29/2022   Procedure: COLONOSCOPY WITH PROPOFOL;  Surgeon: Otis Brace, MD;  Location: [facility];  Service:  Gastroenterology;  Laterality: N/A;   HYSTEROSCOPY WITH NOVASURE N/A 02/06/2014   Procedure: HYSTEROSCOPY WITH NOVASURE;  Surgeon: Logan Bores, MD;  Location: [facility];  Service: Gynecology;  Laterality: N/A;  1hr OR time   POLYPECTOMY  03/29/2022   Procedure: POLYPECTOMY;  Surgeon: Otis Brace, MD;  Location: [facility];  Service: Gastroenterology;;   TUBAL LIGATION  2005   WISDOM TOOTH EXTRACTION  1988    OB History   No obstetric history on file.      Home Medications    Prior to Admission medications   Medication Sig Start Date End Date Taking? Authorizing Provider  sulfamethoxazole-trimethoprim (BACTRIM DS) 800-160 MG tablet Take 1 tablet by mouth 2 (two) times daily for 7 days. 08/13/22 08/20/22 Yes Devone Tousley, Michele Rockers, FNP  benzonatate (TESSALON) 100 MG capsule Take 1 capsule (100 mg total) by mouth every 8 (eight) hours as needed for cough. Patient not taking: Reported on 08/13/2022 08/03/22   Teodora Medici, FNP  cetirizine (ZYRTEC) 10 MG tablet Take 1 tablet (10 mg total) by mouth daily. 08/03/22   Teodora Medici, FNP  Efinaconazole 10 % SOLN Apply 1 drop topically daily. 07/26/22   Trula Slade, DPM  fluticasone (FLONASE) 50 MCG/ACT nasal spray Place 1 spray into both nostrils daily. 08/03/22   Teodora Medici, FNP  ketoconazole (NIZORAL) 2 % cream Apply 1 Application topically daily. 06/28/22   Trula Slade, DPM  Multiple Vitamins-Minerals (MULTIVITAMIN WITH MINERALS) tablet Take 2 tablets by mouth daily.  [provider]  Semaglutide (RYBELSUS) 3 MG TABS Take 3 mg by mouth daily. 12/13/21   [name], MD  telmisartan-hydrochlorothiazide (MICARDIS HCT) 80-25 MG tablet Take 1 tablet by mouth daily. 03/15/22   [name], MD  terbinafine (LAMISIL) 250 MG tablet Take 1 tablet (250 mg total) by mouth daily. 07/26/22   Vivi Barrack, DPM    Family History Family History  Problem Relation Age of Onset   Diabetes Mother    Hyperlipidemia  Father    Hypertension Father     Social History Social History   Tobacco Use   Smoking status: Never   Smokeless tobacco: Never  Vaping Use   Vaping Use: Never used  Substance Use Topics   Alcohol use: No   Drug use: No     Allergies   Shellfish allergy   Review of Systems Review of Systems Per HPI  Physical Exam Triage Vital Signs ED Triage Vitals  Enc Vitals Group     BP 08/13/22 0918 (!) 171/91     Pulse Rate 08/13/22 0917 80     Resp 08/13/22 0917 16     Temp 08/13/22 0917 (!) 97.5 F (36.4 C)     Temp Source 08/13/22 0917 Oral     SpO2 08/13/22 0917 98 %     Weight --      Height --      Head Circumference --      Peak Flow --      Pain Score 08/13/22 0916 0     Pain Loc --      Pain Edu? --      Excl. in GC? --    No data found.  Updated Vital Signs BP (!) 171/91   Pulse 80   Temp (!) 97.5 F (36.4 C) (Oral)   Resp 16   SpO2 98%   Visual Acuity Right Eye Distance:   Left Eye Distance:   Bilateral Distance:    Right Eye Near:   Left Eye Near:    Bilateral Near:     Physical Exam Constitutional:      General: She is not in acute distress.    Appearance: Normal appearance. She is not toxic-appearing or diaphoretic.  HENT:     Head: Normocephalic and atraumatic.  Eyes:     Extraocular Movements: Extraocular movements intact.     Conjunctiva/sclera: Conjunctivae normal.  Cardiovascular:     Rate and Rhythm: Normal rate and regular rhythm.     Pulses: Normal pulses.     Heart sounds: Normal heart sounds.  Pulmonary:     Effort: Pulmonary effort is normal. No respiratory distress.     Breath sounds: Normal breath sounds.  Neurological:     General: No focal deficit present.     Mental Status: She is alert and oriented to person, place, and time. Mental status is at baseline.     Cranial Nerves: Cranial nerves 2-12 are intact.     Sensory: Sensation is intact.     Motor: Motor function is intact.     Coordination: Coordination is  intact.     Gait: Gait is intact.  Psychiatric:        Mood and Affect: Mood normal.        Behavior: Behavior normal.        Thought Content: Thought content normal.        Judgment: Judgment normal.      UC Treatments / Results  Labs (all labs  ordered are listed, but only abnormal results are displayed) Labs Reviewed  POCT URINALYSIS DIP (MANUAL ENTRY) - Abnormal; Notable for the following components:      Result Value   Color, UA orange (*)    Clarity, UA cloudy (*)    Glucose, UA =100 (*)    Spec Grav, UA >=1.030 (*)    Blood, UA large (*)    Protein Ur, POC =100 (*)    Nitrite, UA Positive (*)    Leukocytes, UA Trace (*)    All other components within normal limits  URINE CULTURE    EKG   Radiology No results found.  Procedures Procedures (including critical care time)  Medications Ordered in UC Medications  cefTRIAXone (ROCEPHIN) injection 1 g (1 g Intramuscular Given 08/13/22 1010)    Initial Impression / Assessment and Plan / UC Course  I have reviewed the triage vital signs and the nursing notes.  Pertinent labs & imaging results that were available during my care of the patient were reviewed by me and considered in my medical decision making (see chart for details).     UA indicating urinary tract infection, although, there could be contamination from AZO.  Given associated urinary symptoms will treat with antibiotic.  Given low back pain I am concerned for pyelonephritis so will treat with IM Rocephin as well as oral Bactrim.  Latest creatinine clearance appears normal so no dosage adjustment necessary.  Urine culture is pending.  Patient advised to follow-up if symptoms persist or worsen.  Patient also has elevated blood pressure reading but reports that she has not taken her blood pressure medication.  Advised patient to take her blood pressure medication as soon as possible as prescribed given elevated blood pressure.  She is asymptomatic regarding  blood pressure so patient was encouraged to monitor blood pressure and follow-up if it remains elevated.  Therefore, do not think that emergent evaluation is necessary due to blood pressure at this time.  Patient verbalized understanding and was agreeable with plan. Final Clinical Impressions(s) / UC Diagnoses   Final diagnoses:  Acute cystitis with hematuria  Dysuria  Urinary frequency     Discharge Instructions      It appears that you have a urinary tract infection.  I am treating with antibiotic injection and antibiotic pills that have been sent to the pharmacy.  Urine culture is pending.  Will call with results.  Please follow-up if symptoms persist or worsen.    ED Prescriptions     Medication Sig Dispense Auth. Provider   sulfamethoxazole-trimethoprim (BACTRIM DS) 800-160 MG tablet Take 1 tablet by mouth 2 (two) times daily for 7 days. 14 tablet [place], Michele Rockers, [place]      PDMP not reviewed this encounter.   Teodora Medici, [place] 08/13/22 1136

## 2022-08-15 LAB — URINE CULTURE: Culture: >=100000 — AB

## 2022-09-19 ENCOUNTER — Encounter: Payer: Self-pay | Admitting: Internal Medicine

## 2022-09-19 ENCOUNTER — Ambulatory Visit (INDEPENDENT_AMBULATORY_CARE_PROVIDER_SITE_OTHER): Payer: 59 | Admitting: Internal Medicine

## 2022-09-19 VITALS — BP 130/84 | HR 73 | Temp 98.2°F | Ht 62.8 in | Wt 328.0 lb

## 2022-09-19 DIAGNOSIS — E1159 Type 2 diabetes mellitus with other circulatory complications: Secondary | ICD-10-CM | POA: Diagnosis not present

## 2022-09-19 DIAGNOSIS — I152 Hypertension secondary to endocrine disorders: Secondary | ICD-10-CM

## 2022-09-19 DIAGNOSIS — E1169 Type 2 diabetes mellitus with other specified complication: Secondary | ICD-10-CM | POA: Insufficient documentation

## 2022-09-19 DIAGNOSIS — Z6841 Body Mass Index (BMI) 40.0 and over, adult: Secondary | ICD-10-CM

## 2022-09-19 DIAGNOSIS — Z0000 Encounter for general adult medical examination without abnormal findings: Secondary | ICD-10-CM

## 2022-09-19 LAB — POCT URINALYSIS DIPSTICK
Bilirubin, UA: NEGATIVE
Glucose, UA: NEGATIVE
Ketones, UA: NEGATIVE
Nitrite, UA: NEGATIVE
Protein, UA: NEGATIVE
Spec Grav, UA: 1.02 (ref 1.010–1.025)
Urobilinogen, UA: 0.2 E.U./dL
pH, UA: 7 (ref 5.0–8.0)

## 2022-09-19 NOTE — Patient Instructions (Signed)

## 2022-09-19 NOTE — Progress Notes (Signed)
Ashley Harrison,acting as a Education administrator for Ashley Greenland, MD.,have documented all relevant documentation on the behalf of Ashley Greenland, MD,as directed by  Ashley Greenland, MD while in the presence of Ashley Greenland, MD.   Subjective:     Patient ID: Ashley Harrison , female    DOB: 01-Nov-1970 , 52 y.o.   MRN: [id number]   Chief Complaint  Patient presents with   Annual Exam   Hypertension    HPI  The patient is here today for a physical examination. She is followed by Dr. Diamond Nickel for her GYN exams. Patient reports she did not have her mammogram last year.    Hypertension This is a chronic problem. The current episode started more than 1 year ago. The problem has been gradually worsening since onset. The problem is uncontrolled. Pertinent negatives include no anxiety or peripheral edema. There are no associated agents to hypertension. Risk factors for coronary artery disease include obesity and sedentary lifestyle. Past treatments include angiotensin blockers and diuretics. There are no compliance problems.  There is no history of angina or kidney disease.     Past Medical History:  Diagnosis Date   Hypertension    Iron deficiency anemia due to chronic blood loss 10/31/2017   Iron deficiency anemia, unspecified    Menometrorrhagia 10/31/2017   Obesity      Family History  Problem Relation Age of Onset   Diabetes Mother    Hyperlipidemia Father    Hypertension Father      Current Outpatient Medications:    ketoconazole (NIZORAL) 2 % cream, Apply 1 Application topically daily., Disp: 60 g, Rfl: 0   Multiple Vitamins-Minerals (MULTIVITAMIN WITH MINERALS) tablet, Take 2 tablets by mouth daily., Disp: , Rfl:    Semaglutide (RYBELSUS) 3 MG TABS, Take 3 mg by mouth daily., Disp: 30 tablet, Rfl: 3   telmisartan-hydrochlorothiazide (MICARDIS HCT) 80-25 MG tablet, Take 1 tablet by mouth daily., Disp: 90 tablet, Rfl: 2   Allergies  Allergen Reactions   Shellfish Allergy Hives       The patient states she uses none for birth control. Last LMP was Patient's last menstrual period was 07/26/2022 (approximate).. Negative for Dysmenorrhea. Negative for: breast discharge, breast lump(s), breast pain and breast self exam. Associated symptoms include abnormal vaginal bleeding. Pertinent negatives include abnormal bleeding (hematology), anxiety, decreased libido, depression, difficulty falling sleep, dyspareunia, history of infertility, nocturia, sexual dysfunction, sleep disturbances, urinary incontinence, urinary urgency, vaginal discharge and vaginal itching. Diet regular.The patient states her exercise level is    . The patient's tobacco use is:  Social History   Tobacco Use  Smoking Status Never  Smokeless Tobacco Never  . She has been exposed to passive smoke. The patient's alcohol use is:  Social History   Substance and Sexual Activity  Alcohol Use No   Review of Systems  Constitutional: Negative.   HENT: Negative.    Eyes: Negative.   Respiratory: Negative.    Cardiovascular: Negative.   Gastrointestinal: Negative.   Endocrine: Negative.   Genitourinary: Negative.   Musculoskeletal: Negative.   Skin: Negative.   Allergic/Immunologic: Negative.   Neurological: Negative.   Hematological: Negative.   Psychiatric/Behavioral: Negative.       Today's Vitals   09/19/22 1012  BP: 130/84  Pulse: 73  Temp: 98.2 F (36.8 C)  Weight: (!) 328 lb (148.8 kg)  Height: 5' 2.8" (1.595 m)  PainSc: 0-No pain   Body mass index is 58.47 kg/m.  Wt Readings  from Last 3 Encounters:  09/19/22 (!) 328 lb (148.8 kg)  07/19/22 (!) 328 lb (148.8 kg)  07/15/22 (!) 325 lb (147.4 kg)     Objective:  Physical Exam Vitals and nursing note reviewed.  Constitutional:      Appearance: Normal appearance. She is obese.  HENT:     Head: Normocephalic and atraumatic.     Right Ear: Tympanic membrane, ear canal and external ear normal.     Left Ear: Tympanic membrane, ear  canal and external ear normal.     Nose:     Comments: Masked     Mouth/Throat:     Comments: Masked  Eyes:     Extraocular Movements: Extraocular movements intact.     Conjunctiva/sclera: Conjunctivae normal.     Pupils: Pupils are equal, round, and reactive to light.  Cardiovascular:     Rate and Rhythm: Normal rate and regular rhythm.     Pulses: Normal pulses.          Dorsalis pedis pulses are 2+ on the right side and 2+ on the left side.     Heart sounds: Normal heart sounds.  Pulmonary:     Effort: Pulmonary effort is normal.     Breath sounds: Normal breath sounds.  Chest:  Breasts:    Tanner Score is 5.     Right: Normal.     Left: Normal.  Abdominal:     General: Bowel sounds are normal.     Palpations: Abdomen is soft.     Comments: Obese, difficult to assess organomegaly   Genitourinary:    Comments: deferred Musculoskeletal:        General: Normal range of motion.     Cervical back: Normal range of motion and neck supple.     Right lower leg: Edema present.     Left lower leg: Edema present.  Feet:     Right foot:     Protective Sensation: 5 sites tested.  5 sites sensed.     Skin integrity: Dry skin present.     Toenail Condition: Right toenails are long.     Left foot:     Protective Sensation: 5 sites tested.  5 sites sensed.     Skin integrity: Dry skin present.     Toenail Condition: Left toenails are long.  Skin:    General: Skin is warm and dry.     Comments: Brawny skin b/l LE L>R  Neurological:     General: No focal deficit present.     Mental Status: She is alert and oriented to person, place, and time.  Psychiatric:        Mood and Affect: Mood normal.        Behavior: Behavior normal.      Assessment And Plan:     1. Encounter for general adult medical examination w/o abnormal findings Comments: A full exam was performed. Importance of monthly self breast exams was discussed with the patient. PATIENT IS ADVISED TO GET 30-45 MINUTES  REGULAR EXERCISE NO LESS THAN FOUR TO FIVE DAYS PER WEEK - BOTH WEIGHTBEARING EXERCISES AND AEROBIC ARE RECOMMENDED.  PATIENT IS ADVISED TO FOLLOW A HEALTHY DIET WITH AT LEAST SIX FRUITS/VEGGIES PER DAY, DECREASE INTAKE OF RED MEAT, AND TO INCREASE FISH INTAKE TO TWO DAYS PER WEEK.  MEATS/FISH SHOULD NOT BE FRIED, BAKED OR BROILED IS PREFERABLE.  IT IS ALSO IMPORTANT TO CUT BACK ON YOUR SUGAR INTAKE. PLEASE AVOID ANYTHING WITH ADDED SUGAR, CORN SYRUP OR  OTHER SWEETENERS. IF YOU MUST USE A SWEETENER, YOU CAN TRY STEVIA. IT IS ALSO IMPORTANT TO AVOID ARTIFICIALLY SWEETENERS AND DIET BEVERAGES. LASTLY, I SUGGEST WEARING SPF 50 SUNSCREEN ON EXPOSED PARTS AND ESPECIALLY WHEN IN THE DIRECT SUNLIGHT FOR AN EXTENDED PERIOD OF TIME.  PLEASE AVOID FAST FOOD RESTAURANTS AND INCREASE YOUR WATER INTAKE. - CMP14+EGFR - Hemoglobin A1c - Lipid panel  2. Obesity, diabetes, and hypertension syndrome ([place]) Comments: Diabetic foot exam was performed. Jan 2023 a1c was 6.7. She will c/w Rybelsus 3mg  daily, unable to tolerate 7mg  dose. BP fair control, goal <120/80.  She will f/u in 3-4 months. She is encouraged to increase her daily activity to help augment her weight loss efforts. I DISCUSSED WITH THE PATIENT AT LENGTH REGARDING THE GOALS OF GLYCEMIC CONTROL AND POSSIBLE LONG-TERM COMPLICATIONS.  I  ALSO STRESSED THE IMPORTANCE OF COMPLIANCE WITH HOME GLUCOSE MONITORING, DIETARY RESTRICTIONS INCLUDING AVOIDANCE OF SUGARY DRINKS/PROCESSED FOODS,  ALONG WITH REGULAR EXERCISE.  I  ALSO STRESSED THE IMPORTANCE OF ANNUAL EYE EXAMS, SELF FOOT CARE AND COMPLIANCE WITH OFFICE VISITS.  - POCT Urinalysis Dipstick (81002) - Microalbumin / Creatinine Urine Ratio - EKG 12-Lead  3. Class 3 severe obesity due to excess calories with serious comorbidity and body mass index (BMI) of 50.0 to 59.9 in adult [facility]) Comments: BMI 58.  She is encouraged to aim for at least 150 minutes of exercise/week, while initially striving for BIM<50 to  decrease cardiac risk.  Patient was given opportunity to ask questions. Patient verbalized understanding of the plan and was able to repeat key elements of the plan. All questions were answered to their satisfaction.   I, Ashley Greenland, MD, have reviewed all documentation for this visit. The documentation on 09/19/22 for the exam, diagnosis, procedures, and orders are all accurate and complete.   THE PATIENT IS ENCOURAGED TO PRACTICE SOCIAL DISTANCING DUE TO THE COVID-19 PANDEMIC.

## 2022-09-20 LAB — HEMOGLOBIN A1C
Est. average glucose Bld gHb Est-mCnc: 126 mg/dL
Hgb A1c MFr Bld: 6 % — ABNORMAL HIGH (ref 4.8–5.6)

## 2022-09-20 LAB — LIPID PANEL
Chol/HDL Ratio: 2.9 ratio (ref 0.0–4.4)
Cholesterol, Total: 221 mg/dL — ABNORMAL HIGH (ref 100–199)
HDL: 75 mg/dL (ref >39)
LDL Chol Calc (NIH): 132 mg/dL — ABNORMAL HIGH (ref 0–99)
Triglycerides: 81 mg/dL (ref 0–149)
VLDL Cholesterol Cal: 14 mg/dL (ref 5–40)

## 2022-09-20 LAB — CMP14+EGFR
ALT: 14 IU/L (ref 0–32)
AST: 14 IU/L (ref 0–40)
Albumin/Globulin Ratio: 1.5 (ref 1.2–2.2)
Albumin: 4.1 g/dL (ref 3.8–4.9)
Alkaline Phosphatase: 144 IU/L — ABNORMAL HIGH (ref 44–121)
BUN/Creatinine Ratio: 11 (ref 9–23)
BUN: 8 mg/dL (ref 6–24)
Bilirubin Total: 0.5 mg/dL (ref 0.0–1.2)
CO2: 25 mmol/L (ref 20–29)
Calcium: 9.7 mg/dL (ref 8.7–10.2)
Chloride: 98 mmol/L (ref 96–106)
Creatinine, Ser: 0.71 mg/dL (ref 0.57–1.00)
Globulin, Total: 2.8 g/dL (ref 1.5–4.5)
Glucose: 105 mg/dL — ABNORMAL HIGH (ref 70–99)
Potassium: 4 mmol/L (ref 3.5–5.2)
Sodium: 141 mmol/L (ref 134–144)
Total Protein: 6.9 g/dL (ref 6.0–8.5)
eGFR: 102 mL/min/{1.73_m2} (ref >59)

## 2022-09-20 LAB — MICROALBUMIN / CREATININE URINE RATIO
Creatinine, Urine: 39.8 mg/dL
Microalb/Creat Ratio: 44 mg/g creat — ABNORMAL HIGH (ref 0–29)
Microalbumin, Urine: 17.4 ug/mL

## 2022-09-28 ENCOUNTER — Inpatient Hospital Stay: Payer: 59

## 2022-09-28 ENCOUNTER — Inpatient Hospital Stay: Payer: 59 | Admitting: Family

## 2022-09-29 ENCOUNTER — Other Ambulatory Visit: Payer: Self-pay | Admitting: Internal Medicine

## 2022-09-29 DIAGNOSIS — E786 Lipoprotein deficiency: Secondary | ICD-10-CM

## 2022-09-29 MED ORDER — ATORVASTATIN CALCIUM 20 MG PO TABS: 20.0000 mg | ORAL_TABLET | Freq: Every day | ORAL | 11 refills | Status: DC

## 2022-10-18 ENCOUNTER — Encounter: Payer: Self-pay | Admitting: Family

## 2022-10-19 ENCOUNTER — Encounter: Payer: Self-pay | Admitting: Family

## 2022-10-25 ENCOUNTER — Ambulatory Visit: Payer: 59 | Admitting: Podiatry

## 2022-10-26 ENCOUNTER — Ambulatory Visit: Payer: Self-pay | Admitting: Vascular Surgery

## 2022-11-01 ENCOUNTER — Encounter: Payer: Self-pay | Admitting: Family

## 2022-11-02 ENCOUNTER — Ambulatory Visit: Payer: Self-pay | Admitting: Vascular Surgery

## 2022-11-15 ENCOUNTER — Encounter: Payer: Self-pay | Admitting: Family

## 2022-11-15 ENCOUNTER — Encounter: Payer: Self-pay | Admitting: Internal Medicine

## 2022-11-15 ENCOUNTER — Ambulatory Visit: Payer: BC Managed Care – PPO | Admitting: Internal Medicine

## 2022-11-15 VITALS — BP 134/88 | HR 86 | Temp 97.5°F | Ht 62.0 in | Wt 329.2 lb

## 2022-11-15 DIAGNOSIS — E7800 Pure hypercholesterolemia, unspecified: Secondary | ICD-10-CM | POA: Diagnosis not present

## 2022-11-15 DIAGNOSIS — I152 Hypertension secondary to endocrine disorders: Secondary | ICD-10-CM | POA: Diagnosis not present

## 2022-11-15 DIAGNOSIS — E1169 Type 2 diabetes mellitus with other specified complication: Secondary | ICD-10-CM

## 2022-11-15 DIAGNOSIS — E786 Lipoprotein deficiency: Secondary | ICD-10-CM

## 2022-11-15 DIAGNOSIS — E88819 Insulin resistance, unspecified: Secondary | ICD-10-CM

## 2022-11-15 DIAGNOSIS — E1159 Type 2 diabetes mellitus with other circulatory complications: Secondary | ICD-10-CM

## 2022-11-15 DIAGNOSIS — Z6841 Body Mass Index (BMI) 40.0 and over, adult: Secondary | ICD-10-CM

## 2022-11-15 DIAGNOSIS — E669 Obesity, unspecified: Secondary | ICD-10-CM

## 2022-11-15 DIAGNOSIS — D500 Iron deficiency anemia secondary to blood loss (chronic): Secondary | ICD-10-CM

## 2022-11-15 MED ORDER — TELMISARTAN-HCTZ 80-25 MG PO TABS: 1.0000 | ORAL_TABLET | Freq: Every day | ORAL | 2 refills | Status: DC

## 2022-11-15 NOTE — Progress Notes (Signed)
I,Victoria T Hamilton,acting as a scribe for [name], MD.,have documented all relevant documentation on the behalf of [name], MD,as directed by  [name], MD while in the presence of [name], MD.    Subjective:     Patient ID: Ashley Harrison , female    DOB: 04-05-1971 , 52 y.o.   MRN: [id number]   Chief Complaint  Patient presents with   Hyperlipidemia   Hypertension   Prediabetes    HPI  Patient presents today for chol, prediabetes & bp check. She reports compliance with meds.  She denies headaches, chest pain and shortness of breath.  Patient reports eye exam was done in Oct of 2023, this was performed at [facility]. Medical release form given for note to be sent over.   She admits she has yet to start chol meds, she has been forgetting. Additionally, she recently got a new job and she has new insurance.   Hyperlipidemia This is a chronic problem. The problem is uncontrolled. Current antihyperlipidemic treatment includes statins. Compliance problems include adherence to exercise.   Diabetes Pertinent negatives for hypoglycemia include no headaches. Pertinent negatives for diabetes include no blurred vision.  Hypertension This is a chronic problem. The current episode started more than 1 year ago. The problem has been gradually worsening since onset. The problem is uncontrolled. Pertinent negatives include no anxiety, blurred vision, headaches or peripheral edema. There are no associated agents to hypertension. Risk factors for coronary artery disease include obesity and sedentary lifestyle. Past treatments include angiotensin blockers and diuretics. There are no compliance problems.  There is no history of angina or kidney disease.     Past Medical History:  Diagnosis Date   Hypertension    Iron deficiency anemia due to chronic blood loss 10/31/2017   Iron deficiency anemia, unspecified    Menometrorrhagia 10/31/2017   Obesity       Family History  Problem Relation Age of Onset   Diabetes Mother    Hyperlipidemia Father    Hypertension Father      Current Outpatient Medications:    atorvastatin (LIPITOR) 20 MG tablet, Take 1 tablet (20 mg total) by mouth daily., Disp: 30 tablet, Rfl: 11   ketoconazole (NIZORAL) 2 % cream, Apply 1 Application topically daily., Disp: 60 g, Rfl: 0   Multiple Vitamins-Minerals (MULTIVITAMIN WITH MINERALS) tablet, Take 2 tablets by mouth daily., Disp: , Rfl:    Semaglutide (RYBELSUS) 3 MG TABS, Take 3 mg by mouth daily., Disp: 30 tablet, Rfl: 3   telmisartan-hydrochlorothiazide (MICARDIS HCT) 80-25 MG tablet, Take 1 tablet by mouth daily., Disp: 90 tablet, Rfl: 2   Allergies  Allergen Reactions   Shellfish Allergy Hives     Review of Systems  Constitutional: Negative.   Eyes:  Negative for blurred vision.  Respiratory: Negative.    Cardiovascular: Negative.   Gastrointestinal: Negative.   Musculoskeletal: Negative.   Skin: Negative.   Neurological: Negative.  Negative for headaches.  Psychiatric/Behavioral: Negative.       Today's Vitals   11/15/22 1554  BP: 134/88  Pulse: 86  Temp: (!) 97.5 F (36.4 C)  SpO2: 98%  Weight: (!) 329 lb 3.2 oz (149.3 kg)  Height: 5\' 2"  (1.575 m)   Body mass index is 60.21 kg/m.  Wt Readings from Last 3 Encounters:  11/15/22 (!) 329 lb 3.2 oz (149.3 kg)  09/19/22 (!) 328 lb (148.8 kg)  07/19/22 (!) 328 lb (148.8 kg)  Objective:  Physical Exam Vitals and nursing note reviewed.  Constitutional:      Appearance: Normal appearance. She is obese.  HENT:     Head: Normocephalic and atraumatic.     Nose:     Comments: Masked     Mouth/Throat:     Comments: Masked  Eyes:     Extraocular Movements: Extraocular movements intact.  Cardiovascular:     Rate and Rhythm: Normal rate and regular rhythm.     Heart sounds: Normal heart sounds.  Pulmonary:     Effort: Pulmonary effort is normal.     Breath sounds: Normal breath  sounds.  Musculoskeletal:     Cervical back: Normal range of motion.  Skin:    General: Skin is warm.  Neurological:     General: No focal deficit present.     Mental Status: She is alert.  Psychiatric:        Mood and Affect: Mood normal.        Behavior: Behavior normal.     Assessment And Plan:     1. Pure hypercholesterolemia Comments: Unfortunately, she has not started atorvastatin. Advised to take nightly. I will have her rto in 6-8 weeks for chol check.  2. Obesity, diabetes, and hypertension syndrome ([facility]) Comments: Chronic, fair control of HTN. Goal BP<130/80,ideally<120/80. She will c/w telmisartan/hct 80/25mg  daily. I will start DM meds once she updates her new insurance. Last A1c was 6.0 in Jan 2024.  She will f/u in 6-8 weeks for re-evaluation.   3. Class 3 severe obesity due to excess calories with serious comorbidity and body mass index (BMI) of 60.0 to 69.9 in adult [facility]) Comments: Chronic, she is encouraged to aim for at least 150 minutes of exercise/week, while initially striving for BMI<54 to decrease cardiac risk.   Patient was given opportunity to ask questions. Patient verbalized understanding of the plan and was able to repeat key elements of the plan. All questions were answered to their satisfaction.   I, [name], MD, have reviewed all documentation for this visit. The documentation on 11/15/22 for the exam, diagnosis, procedures, and orders are all accurate and complete.   IF YOU HAVE BEEN REFERRED TO A SPECIALIST, IT MAY TAKE 1-2 WEEKS TO SCHEDULE/PROCESS THE REFERRAL. IF YOU HAVE NOT HEARD FROM US/SPECIALIST IN TWO WEEKS, PLEASE GIVE [place] A CALL AT [phone].   THE PATIENT IS ENCOURAGED TO PRACTICE SOCIAL DISTANCING DUE TO THE COVID-19 PANDEMIC.

## 2022-11-15 NOTE — Patient Instructions (Signed)
Cholesterol Content in Foods ?Cholesterol is a waxy, fat-like substance that helps to carry fat in the blood. The body needs cholesterol in small amounts, but too much cholesterol can cause damage to the arteries and heart. ?What foods have cholesterol? ? ?Cholesterol is found in animal-based foods, such as meat, seafood, and dairy. Generally, low-fat dairy and lean meats have less cholesterol than full-fat dairy and fatty meats. The milligrams of cholesterol per serving (mg per serving) of common cholesterol-containing foods are listed below. ?Meats and other proteins ?Egg -- one large whole egg has 186 mg. ?Veal shank -- 4 oz (113 g) has 141 mg. ?Lean ground turkey (93% lean) -- 4 oz (113 g) has 118 mg. ?Fat-trimmed lamb loin -- 4 oz (113 g) has 106 mg. ?Lean ground beef (90% lean) -- 4 oz (113 g) has 100 mg. ?Lobster -- 3.5 oz (99 g) has 90 mg. ?Pork loin chops -- 4 oz (113 g) has 86 mg. ?Canned salmon -- 3.5 oz (99 g) has 83 mg. ?Fat-trimmed beef top loin -- 4 oz (113 g) has 78 mg. ?Frankfurter -- 1 frank (3.5 oz or 99 g) has 77 mg. ?Crab -- 3.5 oz (99 g) has 71 mg. ?Roasted chicken without skin, white meat -- 4 oz (113 g) has 66 mg. ?Light bologna -- 2 oz (57 g) has 45 mg. ?Deli-cut turkey -- 2 oz (57 g) has 31 mg. ?Canned tuna -- 3.5 oz (99 g) has 31 mg. ?Bacon -- 1 oz (28 g) has 29 mg. ?Oysters and mussels (raw) -- 3.5 oz (99 g) has 25 mg. ?Mackerel -- 1 oz (28 g) has 22 mg. ?Trout -- 1 oz (28 g) has 20 mg. ?Pork sausage -- 1 link (1 oz or 28 g) has 17 mg. ?Salmon -- 1 oz (28 g) has 16 mg. ?Tilapia -- 1 oz (28 g) has 14 mg. ?Dairy ?Soft-serve ice cream -- ? cup (4 oz or 86 g) has 103 mg. ?Whole-milk yogurt -- 1 cup (8 oz or 245 g) has 29 mg. ?Cheddar cheese -- 1 oz (28 g) has 28 mg. ?American cheese -- 1 oz (28 g) has 28 mg. ?Whole milk -- 1 cup (8 oz or 250 mL) has 23 mg. ?2% milk -- 1 cup (8 oz or 250 mL) has 18 mg. ?Cream cheese -- 1 tablespoon (Tbsp) (14.5 g) has 15 mg. ?Cottage cheese -- ? cup (4 oz or  113 g) has 14 mg. ?Low-fat (1%) milk -- 1 cup (8 oz or 250 mL) has 10 mg. ?Sour cream -- 1 Tbsp (12 g) has 8.5 mg. ?Low-fat yogurt -- 1 cup (8 oz or 245 g) has 8 mg. ?Nonfat Greek yogurt -- 1 cup (8 oz or 228 g) has 7 mg. ?Half-and-half cream -- 1 Tbsp (15 mL) has 5 mg. ?Fats and oils ?Cod liver oil -- 1 tablespoon (Tbsp) (13.6 g) has 82 mg. ?Butter -- 1 Tbsp (14 g) has 15 mg. ?Lard -- 1 Tbsp (12.8 g) has 14 mg. ?Bacon grease -- 1 Tbsp (12.9 g) has 14 mg. ?Mayonnaise -- 1 Tbsp (13.8 g) has 5-10 mg. ?Margarine -- 1 Tbsp (14 g) has 3-10 mg. ?The items listed above may not be a complete list of foods with cholesterol. Exact amounts of cholesterol in these foods may vary depending on specific ingredients and brands. Contact a dietitian for more information. ?What foods do not have cholesterol? ?Most plant-based foods do not have cholesterol unless you combine them with a food that has   cholesterol. Foods without cholesterol include: ?Grains and cereals. ?Vegetables. ?Fruits. ?Vegetable oils, such as olive, canola, and sunflower oil. ?Legumes, such as peas, beans, and lentils. ?Nuts and seeds. ?Egg whites. ?The items listed above may not be a complete list of foods that do not have cholesterol. Contact a dietitian for more information. ?Summary ?The body needs cholesterol in small amounts, but too much cholesterol can cause damage to the arteries and heart. ?Cholesterol is found in animal-based foods, such as meat, seafood, and dairy. Generally, low-fat dairy and lean meats have less cholesterol than full-fat dairy and fatty meats. ?This information is not intended to replace advice given to you by your health care provider. Make sure you discuss any questions you have with your health care provider. ?Document Revised: 12/25/2020 Document Reviewed: 12/25/2020 ?Elsevier Patient Education ? 2023 Elsevier Inc. ? ?

## 2022-11-22 ENCOUNTER — Other Ambulatory Visit: Payer: Self-pay

## 2022-11-22 DIAGNOSIS — I10 Essential (primary) hypertension: Secondary | ICD-10-CM

## 2022-11-22 MED ORDER — VALSARTAN-HYDROCHLOROTHIAZIDE 160-12.5 MG PO TABS: 1.0000 | ORAL_TABLET | Freq: Every day | ORAL | 1 refills | Status: DC

## 2022-11-28 ENCOUNTER — Ambulatory Visit: Payer: 59 | Admitting: Podiatry

## 2022-11-30 ENCOUNTER — Ambulatory Visit
Admission: EM | Admit: 2022-11-30 | Discharge: 2022-11-30 | Disposition: A | Discharge status: Home / Self Care | Payer: BC Managed Care – PPO | Attending: Emergency Medicine | Admitting: Emergency Medicine

## 2022-11-30 DIAGNOSIS — N3001 Acute cystitis with hematuria: Secondary | ICD-10-CM | POA: Diagnosis not present

## 2022-11-30 LAB — POCT URINALYSIS DIP (MANUAL ENTRY)
Bilirubin, UA: NEGATIVE
Glucose, UA: NEGATIVE mg/dL
Ketones, POC UA: NEGATIVE mg/dL
Nitrite, UA: NEGATIVE
Protein Ur, POC: 100 mg/dL — AB
Spec Grav, UA: >=1.03 — AB (ref 1.010–1.025)
Urobilinogen, UA: 1 E.U./dL
pH, UA: 6.5 (ref 5.0–8.0)

## 2022-11-30 MED ORDER — CEPHALEXIN 500 MG PO CAPS: 500.0000 mg | ORAL_CAPSULE | Freq: Two times a day (BID) | ORAL | 0 refills | Status: AC

## 2022-11-30 NOTE — ED Provider Notes (Signed)
[name]    CSN: [id number] Arrival date & time: 11/30/22  1803      History   Chief Complaint Chief Complaint  Patient presents with   Dysuria    HPI Ashley Harrison is a 52 y.o. female.   Patient presents for evaluation of urinary frequency, urgency, dysuria, hematuria and lower abdominal pressure with urination beginning today.  Has not attempted treatment.  Denies fevers, flank pain, vaginal symptoms.    Past Medical History:  Diagnosis Date   Hypertension    Iron deficiency anemia due to chronic blood loss 10/31/2017   Iron deficiency anemia, unspecified    Menometrorrhagia 10/31/2017   Obesity     Patient Active Problem List   Diagnosis Date Noted   Obesity, diabetes, and hypertension syndrome 09/19/2022   Chronic venous insufficiency 07/19/2022   Insulin resistance 12/13/2021   OSA on CPAP 06/19/2019   Fatigue due to sleep pattern disturbance 03/31/2019   Essential hypertension, benign 10/09/2018   Snoring 10/09/2018   Class 3 severe obesity due to excess calories with serious comorbidity and body mass index (BMI) of 50.0 to 59.9 in adult 10/09/2018   Morbid obesity due to excess calories 06/05/2018   Iron deficiency anemia due to chronic blood loss 10/31/2017   Menometrorrhagia 10/31/2017   Anemia 09/21/2011    Past Surgical History:  Procedure Laterality Date   CESAREAN SECTION  01/2001, 05/2004    x 2   COLONOSCOPY WITH PROPOFOL N/A 03/29/2022   Procedure: COLONOSCOPY WITH PROPOFOL;  Surgeon: Otis Brace, MD;  Location: [facility];  Service: Gastroenterology;  Laterality: N/A;   HYSTEROSCOPY WITH NOVASURE N/A 02/06/2014   Procedure: HYSTEROSCOPY WITH NOVASURE;  Surgeon: Logan Bores, MD;  Location: [facility];  Service: Gynecology;  Laterality: N/A;  1hr OR time   POLYPECTOMY  03/29/2022   Procedure: POLYPECTOMY;  Surgeon: Otis Brace, MD;  Location: [facility];  Service: Gastroenterology;;   TUBAL LIGATION  2005   WISDOM TOOTH  EXTRACTION  1988    OB History   No obstetric history on file.      Home Medications    Prior to Admission medications   Medication Sig Start Date End Date Taking? Authorizing Provider  atorvastatin (LIPITOR) 20 MG tablet Take 1 tablet (20 mg total) by mouth daily. 09/29/22 09/29/23  Glendale Chard, MD  ketoconazole (NIZORAL) 2 % cream Apply 1 Application topically daily. 06/28/22   Trula Slade, DPM  Multiple Vitamins-Minerals (MULTIVITAMIN WITH MINERALS) tablet Take 2 tablets by mouth daily.    [provider]  Semaglutide (RYBELSUS) 3 MG TABS Take 3 mg by mouth daily. 12/13/21   Glendale Chard, MD  valsartan-hydrochlorothiazide (DIOVAN HCT) 160-12.5 MG tablet Take 1 tablet by mouth daily. 11/22/22 11/22/23  Glendale Chard, MD    Family History Family History  Problem Relation Age of Onset   Diabetes Mother    Hyperlipidemia Father    Hypertension Father     Social History Social History   Tobacco Use   Smoking status: Never   Smokeless tobacco: Never  Vaping Use   Vaping Use: Never used  Substance Use Topics   Alcohol use: No   Drug use: No     Allergies   Shellfish allergy   Review of Systems Review of Systems  Constitutional: Negative.   HENT: Negative.    Respiratory: Negative.    Cardiovascular: Negative.   Genitourinary:  Positive for dysuria, frequency, hematuria, pelvic pain and urgency. Negative for decreased urine volume,  difficulty urinating, dyspareunia, enuresis, flank pain, genital sores, menstrual problem, vaginal bleeding, vaginal discharge and vaginal pain.     Physical Exam Triage Vital Signs ED Triage Vitals  Enc Vitals Group     BP 11/30/22 1823 (!) 195/117     Pulse Rate 11/30/22 1823 82     Resp 11/30/22 1823 18     Temp 11/30/22 1823 98 F (36.7 C)     Temp Source 11/30/22 1823 Oral     SpO2 11/30/22 1823 95 %     Weight --      Height --      Head Circumference --      Peak Flow --      Pain Score 11/30/22 1824  0     Pain Loc --      Pain Edu? --      Excl. in [place]? --    No data found.  Updated Vital Signs BP (!) 187/117   Pulse 82   Temp 98 F (36.7 C) (Oral)   Resp 18   SpO2 95%   Visual Acuity Right Eye Distance:   Left Eye Distance:   Bilateral Distance:    Right Eye Near:   Left Eye Near:    Bilateral Near:     Physical Exam Constitutional:      Appearance: Normal appearance.  Eyes:     Extraocular Movements: Extraocular movements intact.  Pulmonary:     Effort: Pulmonary effort is normal.  Abdominal:     General: Abdomen is flat. Bowel sounds are normal. There is no distension.     Palpations: Abdomen is soft.     Tenderness: There is no abdominal tenderness. There is no right CVA tenderness, left CVA tenderness or guarding.  Neurological:     Mental Status: She is alert and oriented to person, place, and time. Mental status is at baseline.      UC Treatments / Results  Labs (all labs ordered are listed, but only abnormal results are displayed) Labs Reviewed  POCT URINALYSIS DIP (MANUAL ENTRY) - Abnormal; Notable for the following components:      Result Value   Color, UA red (*)    Clarity, UA cloudy (*)    Spec Grav, UA >=1.030 (*)    Blood, UA large (*)    Protein Ur, POC =100 (*)    Leukocytes, UA Small (1+) (*)    All other components within normal limits  URINE CULTURE    EKG   Radiology No results found.  Procedures Procedures (including critical care time)  Medications Ordered in UC Medications - No data to display  Initial Impression / Assessment and Plan / UC Course  I have reviewed the triage vital signs and the nursing notes.  Pertinent labs & imaging results that were available during my care of the patient were reviewed by me and considered in my medical decision making (see chart for details).  Acute cystitis with hematuria  Urinalysis showing leukocytes and hemoglobin, negative for nitrates, sent for culture, discussed findings  with patient, she is symptomatic we will prophylactically begin treatment, cephalexin prescribed, recommended additional supportive measures through Pyridium, increase fluid intake and good hygiene measures, may follow-up with urgent care as needed if symptoms persist or worsen or recur Final Clinical Impressions(s) / UC Diagnoses   Final diagnoses:  Acute cystitis with hematuria   Discharge Instructions   None    ED Prescriptions   None    PDMP not reviewed this  encounter.   Hans Eden, NP 11/30/22 1905

## 2022-11-30 NOTE — Discharge Instructions (Addendum)
Your urinalysis shows Ashley Harrison blood cells which fight infection but did not show bacteria, your urine will be sent to the lab to determine exactly which bacteria is present, if any changes need to be made to your medications you will be notified  Begin use of illicit every morning and every evening for 5 days  You may use over-the-counter Pyridium (Azo, Cystex) to help minimize your symptoms until antibiotic removes bacteria, this medication will turn your urine orange  Increase your fluid intake through use of water  As always practice good hygiene, wiping front to back and avoidance of scented vaginal products to prevent further irritation  If symptoms continue to persist after use of medication or recur please follow-up with urgent care or your primary doctor as needed

## 2022-11-30 NOTE — ED Triage Notes (Signed)
Pt c/o frequency, dysuria, some blood after voiding   Onset ~ today

## 2022-12-05 LAB — URINE CULTURE: Culture: 80000 — AB

## 2022-12-07 ENCOUNTER — Ambulatory Visit: Payer: Self-pay | Admitting: Vascular Surgery

## 2022-12-13 ENCOUNTER — Ambulatory Visit: Payer: BC Managed Care – PPO

## 2022-12-13 VITALS — BP 136/100 | HR 85 | Temp 98.4°F | Ht 63.0 in | Wt 329.0 lb

## 2022-12-13 DIAGNOSIS — I10 Essential (primary) hypertension: Secondary | ICD-10-CM

## 2022-12-13 MED ORDER — AMLODIPINE BESYLATE 2.5 MG PO TABS: ORAL_TABLET | ORAL | 2 refills | Status: DC

## 2022-12-13 NOTE — Progress Notes (Signed)
Patient presents today for BP check and labs, patient currently taking valsartan-hctz 160-12.5MG . BP Readings from Last 3 Encounters:  12/13/22 (!) 130/100  11/30/22 (!) 187/117  11/15/22 134/88  Per provider- add amlodipine 2.5mg  nightly - 2 week NV follow up

## 2022-12-14 LAB — BMP8+EGFR
BUN/Creatinine Ratio: 12 (ref 9–23)
BUN: 8 mg/dL (ref 6–24)
CO2: 23 mmol/L (ref 20–29)
Calcium: 9.9 mg/dL (ref 8.7–10.2)
Chloride: 102 mmol/L (ref 96–106)
Creatinine, Ser: 0.69 mg/dL (ref 0.57–1.00)
Glucose: 106 mg/dL — ABNORMAL HIGH (ref 70–99)
Potassium: 4.3 mmol/L (ref 3.5–5.2)
Sodium: 142 mmol/L (ref 134–144)
eGFR: 104 mL/min/{1.73_m2} (ref >59)

## 2022-12-15 ENCOUNTER — Other Ambulatory Visit: Payer: Self-pay | Admitting: Internal Medicine

## 2022-12-21 ENCOUNTER — Ambulatory Visit: Payer: BC Managed Care – PPO | Admitting: Vascular Surgery

## 2022-12-21 ENCOUNTER — Encounter: Payer: Self-pay | Admitting: Vascular Surgery

## 2022-12-21 VITALS — BP 166/112 | HR 90 | Temp 98.0°F | Resp 20 | Ht 63.0 in | Wt 329.0 lb

## 2022-12-21 DIAGNOSIS — I8393 Asymptomatic varicose veins of bilateral lower extremities: Secondary | ICD-10-CM

## 2022-12-21 DIAGNOSIS — I872 Venous insufficiency (chronic) (peripheral): Secondary | ICD-10-CM | POA: Diagnosis not present

## 2022-12-21 NOTE — Progress Notes (Signed)
Patient ID: Ashley Harrison, female   DOB: Jan 03, 1971, 52 y.o.   MRN: [id number]  Reason for Consult: Follow-up   Referred by [name], MD  Subjective:     HPI:  Ashley Harrison is a 52 y.o. female history hypertension and obesity presents for bilateral lower extremity leg swelling left greater than right.  She does have skin changes on the left.  She states that the swelling has been better with compression stockings which she is wearing religiously.  She has not lost weight since her last evaluation.  She does walk without any assistance does not have any ulceration and has not had previous ulceration of her legs.  Past Medical History:  Diagnosis Date   Hypertension    Iron deficiency anemia due to chronic blood loss 10/31/2017   Iron deficiency anemia, unspecified    Menometrorrhagia 10/31/2017   Obesity    Family History  Problem Relation Age of Onset   Diabetes Mother    Hyperlipidemia Father    Hypertension Father    Past Surgical History:  Procedure Laterality Date   CESAREAN SECTION  01/2001, 05/2004    x 2   COLONOSCOPY WITH PROPOFOL N/A 03/29/2022   Procedure: COLONOSCOPY WITH PROPOFOL;  Surgeon: Kathi Der, MD;  Location: [facility];  Service: Gastroenterology;  Laterality: N/A;   HYSTEROSCOPY WITH NOVASURE N/A 02/06/2014   Procedure: HYSTEROSCOPY WITH NOVASURE;  Surgeon: Oliver Pila, MD;  Location: [facility];  Service: Gynecology;  Laterality: N/A;  1hr OR time   POLYPECTOMY  03/29/2022   Procedure: POLYPECTOMY;  Surgeon: Kathi Der, MD;  Location: [facility];  Service: Gastroenterology;;   TUBAL LIGATION  2005   WISDOM TOOTH EXTRACTION  1988    Short Social History:  Social History   Tobacco Use   Smoking status: Never   Smokeless tobacco: Never  Substance Use Topics   Alcohol use: No    Allergies  Allergen Reactions   Shellfish Allergy Hives    Current Outpatient Medications  Medication Sig Dispense Refill   amLODipine  (NORVASC) 2.5 MG tablet Take 1 tablet by mouth every night 90 tablet 2   atorvastatin (LIPITOR) 20 MG tablet Take 1 tablet (20 mg total) by mouth daily. 30 tablet 11   ketoconazole (NIZORAL) 2 % cream Apply 1 Application topically daily. 60 g 0   Multiple Vitamins-Minerals (MULTIVITAMIN WITH MINERALS) tablet Take 2 tablets by mouth daily.     RYBELSUS 3 MG TABS Take 1 tablet by mouth once daily 30 tablet 0   valsartan-hydrochlorothiazide (DIOVAN HCT) 160-12.5 MG tablet Take 1 tablet by mouth daily. 30 tablet 1   No current facility-administered medications for this visit.    Review of Systems  Constitutional:  Constitutional negative. HENT: HENT negative.  Eyes: Eyes negative.  Respiratory: Respiratory negative.  Cardiovascular: Positive for leg swelling.  GI: Gastrointestinal negative.  Musculoskeletal: Positive for leg pain.  Skin: Skin negative.  Neurological: Neurological negative. Hematologic: Hematologic/lymphatic negative.  Psychiatric: Psychiatric negative.        Objective:  Objective   Vitals:   12/21/22 0822  BP: (!) 166/112  Pulse: 90  Resp: 20  Temp: 98 F (36.7 C)  SpO2: 96%     Physical Exam HENT:     Head: Normocephalic.     Nose: Nose normal.  Eyes:     Pupils: Pupils are equal, round, and reactive to light.  Cardiovascular:     Rate and Rhythm: Normal rate.  Pulmonary:  Effort: Pulmonary effort is normal.  Abdominal:     General: Abdomen is flat.     Palpations: Abdomen is soft.  Musculoskeletal:     Cervical back: Normal range of motion.     Right lower leg: Edema present.     Left lower leg: Edema present.  Skin:    General: Skin is warm.     Capillary Refill: Capillary refill takes less than 2 seconds.     Comments: Significant dark discoloration left medial ankle extending to calf with minimal on the right  Neurological:     General: No focal deficit present.     Mental Status: She is alert.  Psychiatric:        Mood and Affect:  Mood normal.        Thought Content: Thought content normal.        Judgment: Judgment normal.     Data: LEFT          Reflux NoRefluxReflux TimeDiameter cmsComments                          Yes                                   +--------------+---------+------+-----------+------------+--------+  CFV                    yes   >1 second                       +--------------+---------+------+-----------+------------+--------+  FV prox       no                                              +--------------+---------+------+-----------+------------+--------+  FV mid        no                                              +--------------+---------+------+-----------+------------+--------+  FV dist       no                                              +--------------+---------+------+-----------+------------+--------+  Popliteal    no                                              +--------------+---------+------+-----------+------------+--------+  GSV at SFJ              yes    >500 ms      0.70              +--------------+---------+------+-----------+------------+--------+  GSV prox thigh          yes    >500 ms      0.85              +--------------+---------+------+-----------+------------+--------+  GSV mid thigh           yes    >  500 ms      0.72              +--------------+---------+------+-----------+------------+--------+  GSV dist thigh          yes    >500 ms      0.63              +--------------+---------+------+-----------+------------+--------+  GSV at knee   no                            0.53              +--------------+---------+------+-----------+------------+--------+  GSV prox calf no                            0.45              +--------------+---------+------+-----------+------------+--------+  SSV prox calf no                            0.29               +--------------+---------+------+-----------+------------+--------+  SSV mid calf            yes    >500 ms      0.19              +--------------+---------+------+-----------+------------+--------+        Summary:  Left:  - No evidence of deep vein thrombosis seen in the left lower extremity,  from the common femoral through the popliteal veins.  - No evidence of superficial venous thrombosis in the left lower  extremity.    - Venous reflux is noted in the left common femoral vein.  - Venous reflux is noted in the left sapheno-femoral junction.  - Venous reflux is noted in the left greater saphenous vein in the thigh.  - Venous reflux is noted in the left short saphenous vein.      Assessment/Plan:     52 year old female was seen for a left lower extremity chronic venous insufficiency with large refluxing great saphenous vein.  I recommended continued compression stockings and significant weight loss to help her symptoms.  I discussed that any venous intervention is likely to recur at her current level of obesity and as such would not recommend any surgical intervention unless she truly was developing ulceration which she is at risk for.  She will continue her compression stockings hopefully will lose some weight symptoms call me on an basis.     Maeola Harman MD [facility]

## 2022-12-27 ENCOUNTER — Ambulatory Visit: Payer: Self-pay

## 2022-12-27 NOTE — Progress Notes (Signed)
NO SHOW

## 2022-12-30 ENCOUNTER — Telehealth: Payer: Self-pay

## 2022-12-30 NOTE — Telephone Encounter (Signed)
PA for Rybelsus 3mg  has been submitted through covermymeds we are waiting on the determination. Pt will also receive the determination through text. YL,RMA

## 2023-01-20 DIAGNOSIS — Z1231 Encounter for screening mammogram for malignant neoplasm of breast: Secondary | ICD-10-CM | POA: Diagnosis not present

## 2023-01-20 LAB — HM MAMMOGRAPHY

## 2023-01-24 ENCOUNTER — Other Ambulatory Visit: Payer: Self-pay

## 2023-01-24 MED ORDER — BLOOD PRESSURE MONITOR DEVI: 0 refills | Status: AC

## 2023-01-24 NOTE — Progress Notes (Signed)
   Ashley Harrison 1971/05/19 [id number]  Patient outreached by Seward Meth , PharmD Candidate on 01/24/23.  Blood Pressure Readings: Last documented ambulatory systolic blood pressure: 166 Last documented ambulatory diastolic blood pressure: 112 Does the patient have a validated home blood pressure machine?: Yes They report home readings :BP cuff is too small -- spoke with Catie and she is sending in a new script for a large one and spoke with patient about purchasing just the cuff if insurance does not cover a new machine.   Medication review was performed. Is the patient taking their medications as prescribed?: Yes Differences from their prescribed list include: She is currently taking Rybelsus but she just switched insurance and they no longer cover it. Let her know to mention it to her provider tomorrow as she thinks she needs a prior authorization.   The following barriers to adherence were noted: Does the patient have cost concerns?: No Does the patient have transportation concerns?: No Does the patient need assistance obtaining refills?: No Does the patient have questions or concerns about their medications?: Yes Does the patient have a follow up scheduled with their primary care provider/cardiologist?: Yes   Interventions: Interventions Completed: Medications were reviewed, Patient was educated on proper technique to check home blood pressure and reminded to bring home machine and readings to next provider appointment  The patient has follow up scheduled:  PCP: [name], MD   Seward Meth, Student-PharmD

## 2023-01-25 ENCOUNTER — Encounter: Payer: Self-pay | Admitting: Internal Medicine

## 2023-01-25 ENCOUNTER — Ambulatory Visit: Payer: BC Managed Care – PPO | Admitting: Internal Medicine

## 2023-01-25 VITALS — BP 140/100 | HR 85 | Temp 98.1°F | Ht 63.0 in | Wt 332.4 lb

## 2023-01-25 DIAGNOSIS — Z6841 Body Mass Index (BMI) 40.0 and over, adult: Secondary | ICD-10-CM

## 2023-01-25 DIAGNOSIS — I152 Hypertension secondary to endocrine disorders: Secondary | ICD-10-CM | POA: Diagnosis not present

## 2023-01-25 DIAGNOSIS — E7800 Pure hypercholesterolemia, unspecified: Secondary | ICD-10-CM | POA: Diagnosis not present

## 2023-01-25 DIAGNOSIS — E1169 Type 2 diabetes mellitus with other specified complication: Secondary | ICD-10-CM | POA: Diagnosis not present

## 2023-01-25 DIAGNOSIS — E1159 Type 2 diabetes mellitus with other circulatory complications: Secondary | ICD-10-CM | POA: Diagnosis not present

## 2023-01-25 DIAGNOSIS — E669 Obesity, unspecified: Secondary | ICD-10-CM | POA: Diagnosis not present

## 2023-01-25 LAB — CMP14+EGFR
ALT: 12 IU/L (ref 0–32)
AST: 14 IU/L (ref 0–40)
Albumin/Globulin Ratio: 1.5 (ref 1.2–2.2)
Albumin: 4.1 g/dL (ref 3.8–4.9)
Alkaline Phosphatase: 144 IU/L — ABNORMAL HIGH (ref 44–121)
BUN/Creatinine Ratio: 12 (ref 9–23)
BUN: 8 mg/dL (ref 6–24)
Bilirubin Total: 0.4 mg/dL (ref 0.0–1.2)
CO2: 24 mmol/L (ref 20–29)
Calcium: 9.7 mg/dL (ref 8.7–10.2)
Chloride: 102 mmol/L (ref 96–106)
Creatinine, Ser: 0.66 mg/dL (ref 0.57–1.00)
Globulin, Total: 2.8 g/dL (ref 1.5–4.5)
Glucose: 115 mg/dL — ABNORMAL HIGH (ref 70–99)
Potassium: 4.3 mmol/L (ref 3.5–5.2)
Sodium: 141 mmol/L (ref 134–144)
Total Protein: 6.9 g/dL (ref 6.0–8.5)
eGFR: 105 mL/min/{1.73_m2} (ref >59)

## 2023-01-25 LAB — LIPID PANEL
Chol/HDL Ratio: 2.7 ratio (ref 0.0–4.4)
Cholesterol, Total: 216 mg/dL — ABNORMAL HIGH (ref 100–199)
HDL: 79 mg/dL (ref >39)
LDL Chol Calc (NIH): 127 mg/dL — ABNORMAL HIGH (ref 0–99)
Triglycerides: 59 mg/dL (ref 0–149)
VLDL Cholesterol Cal: 10 mg/dL (ref 5–40)

## 2023-01-25 LAB — HEMOGLOBIN A1C
Est. average glucose Bld gHb Est-mCnc: 140 mg/dL
Hgb A1c MFr Bld: 6.5 % — ABNORMAL HIGH (ref 4.8–5.6)

## 2023-01-25 NOTE — Progress Notes (Signed)
I,Victoria T Hamilton,acting as a scribe for [name], MD.,have documented all relevant documentation on the behalf of [name], MD,as directed by  [name], MD while in the presence of [name], MD.    Subjective:     Patient ID: Ashley Harrison , female    DOB: 1970/12/31 , 52 y.o.   MRN: [id number]   Chief Complaint  Patient presents with   Diabetes   Hypertension   Hyperlipidemia    HPI  Patient presents today for HTN and diabetes check. She reports compliance with meds.  She denies headaches, chest pain and shortness of breath.  She reports her insurance will no longer cover the Rybelsus. She admits only having a couple of pills left.   She reports completing mammogram last Friday via mobile mammogram bus.   Hypertension This is a chronic problem. The current episode started more than 1 year ago. The problem has been gradually worsening since onset. The problem is uncontrolled. Pertinent negatives include no anxiety, blurred vision, headaches or peripheral edema. There are no associated agents to hypertension. Risk factors for coronary artery disease include obesity and sedentary lifestyle. Past treatments include angiotensin blockers and diuretics. There are no compliance problems.  There is no history of angina or kidney disease.     Past Medical History:  Diagnosis Date   Hypertension    Iron deficiency anemia due to chronic blood loss 10/31/2017   Iron deficiency anemia, unspecified    Menometrorrhagia 10/31/2017   Obesity      Family History  Problem Relation Age of Onset   Diabetes Mother    Hyperlipidemia Father    Hypertension Father      Current Outpatient Medications:    amLODipine (NORVASC) 2.5 MG tablet, Take 1 tablet by mouth every night, Disp: 90 tablet, Rfl: 2   atorvastatin (LIPITOR) 20 MG tablet, Take 1 tablet (20 mg total) by mouth daily., Disp: 30 tablet, Rfl: 11   Blood Pressure Monitor DEVI, Use to check blood pressure  daily. Please dispense XL blood pressure cuff if covered by insurance. I10.0., Disp: 1 each, Rfl: 0   Multiple Vitamins-Minerals (MULTIVITAMIN WITH MINERALS) tablet, Take 2 tablets by mouth daily., Disp: , Rfl:    RYBELSUS 3 MG TABS, Take 1 tablet by mouth once daily, Disp: 30 tablet, Rfl: 0   valsartan-hydrochlorothiazide (DIOVAN HCT) 160-12.5 MG tablet, Take 1 tablet by mouth daily., Disp: 30 tablet, Rfl: 1   ketoconazole (NIZORAL) 2 % cream, Apply 1 Application topically daily. (Patient not taking: Reported on 01/24/2023), Disp: 60 g, Rfl: 0   Allergies  Allergen Reactions   Shellfish Allergy Hives     Review of Systems  Constitutional: Negative.   Eyes:  Negative for blurred vision.  Respiratory: Negative.    Cardiovascular: Negative.   Gastrointestinal: Negative.   Musculoskeletal: Negative.   Skin: Negative.   Neurological: Negative.  Negative for headaches.  Psychiatric/Behavioral: Negative.       Today's Vitals   01/25/23 0848 01/25/23 0914  BP: (!) 150/110 (!) 140/100  Pulse: 85   Temp: 98.1 F (36.7 C)   SpO2: 98%   Weight: (!) 332 lb 6.4 oz (150.8 kg)   Height: 5\' 3"  (1.6 m)    Wt Readings from Last 3 Encounters:  01/25/23 (!) 332 lb 6.4 oz (150.8 kg)  12/21/22 (!) 329 lb (149.2 kg)  12/13/22 (!) 329 lb (149.2 kg)    Body mass index is 58.88 kg/m.  The 10-year  ASCVD risk score (Arnett DK, et al., 2019) is: 9.9%   Values used to calculate the score:     Age: 71 years     Sex: Female     Is Non-Hispanic African American: Yes     Diabetic: Yes     Tobacco smoker: No     Systolic Blood Pressure: 140 mmHg     Is BP treated: Yes     HDL Cholesterol: 79 mg/dL     Total Cholesterol: 216 mg/dL ++ Objective:  Physical Exam Vitals and nursing note reviewed.  Constitutional:      Appearance: Normal appearance. She is obese.  HENT:     Head: Normocephalic and atraumatic.  Eyes:     Extraocular Movements: Extraocular movements intact.  Cardiovascular:      Rate and Rhythm: Normal rate and regular rhythm.     Heart sounds: Normal heart sounds.  Pulmonary:     Effort: Pulmonary effort is normal.     Breath sounds: Normal breath sounds.  Musculoskeletal:     Cervical back: Normal range of motion.  Skin:    General: Skin is warm.  Neurological:     General: No focal deficit present.     Mental Status: She is alert.  Psychiatric:        Mood and Affect: Mood normal.        Behavior: Behavior normal.      Assessment And Plan:     1. Obesity, diabetes, and hypertension syndrome (HCC) Comments: Bp uncontrolled. I will increase amlodipine to 5mg  nightly. She agrees to take TWO 2.5mg  tabs daily until she runs out. She will rto in 2 wks for NV. I plan to increase Rybelsus to 6mg , TWO 3mg  tablets daily. She was unable to tolerate 7mg  dose in the past. She will have DM check in 3 months.  - Hemoglobin A1c - CMP14+EGFR  2. Pure hypercholesterolemia Comments: Chronic, due to underlying diabetes, LDL goal < 70.  She will c/w atorvastatin 20mg  daily. - Lipid panel  3. Class 3 severe obesity due to excess calories with serious comorbidity and body mass index (BMI) of 50.0 to 59.9 in adult [facility]) Chronic, encouraged to follow heart healthy lifestyle. Clean eating, regular exercise and stress management are all a part of this lifestyle. She is also encouraged to continue with statin therapy.    Return in 2 weeks (on 02/08/2023), or nv bp check, for 3 month dm check.  Patient was given opportunity to ask questions. Patient verbalized understanding of the plan and was able to repeat key elements of the plan. All questions were answered to their satisfaction.     IF YOU HAVE BEEN REFERRED TO A SPECIALIST, IT MAY TAKE 1-2 WEEKS TO SCHEDULE/PROCESS THE REFERRAL. IF YOU HAVE NOT HEARD FROM US/SPECIALIST IN TWO WEEKS, PLEASE GIVE [place] A CALL AT [phone].   THE PATIENT IS ENCOURAGED TO PRACTICE SOCIAL DISTANCING DUE TO THE COVID-19 PANDEMIC.

## 2023-01-25 NOTE — Patient Instructions (Signed)
Hypertension, Adult Hypertension is another name for high blood pressure. High blood pressure forces your heart to work harder to pump blood. This can cause problems over time. There are two numbers in a blood pressure reading. There is a top number (systolic) over a bottom number (diastolic). It is best to have a blood pressure that is below 120/80. What are the causes? The cause of this condition is not known. Some other conditions can lead to high blood pressure. What increases the risk? Some lifestyle factors can make you more likely to develop high blood pressure: Smoking. Not getting enough exercise or physical activity. Being overweight. Having too much fat, sugar, calories, or salt (sodium) in your diet. Drinking too much alcohol. Other risk factors include: Having any of these conditions: Heart disease. Diabetes. High cholesterol. Kidney disease. Obstructive sleep apnea. Having a family history of high blood pressure and high cholesterol. Age. The risk increases with age. Stress. What are the signs or symptoms? High blood pressure may not cause symptoms. Very high blood pressure (hypertensive crisis) may cause: Headache. Fast or uneven heartbeats (palpitations). Shortness of breath. Nosebleed. Vomiting or feeling like you may vomit (nauseous). Changes in how you see. Very bad chest pain. Feeling dizzy. Seizures. How is this treated? This condition is treated by making healthy lifestyle changes, such as: Eating healthy foods. Exercising more. Drinking less alcohol. Your doctor may prescribe medicine if lifestyle changes do not help enough and if: Your top number is above 130. Your bottom number is above 80. Your personal target blood pressure may vary. Follow these instructions at home: Eating and drinking  If told, follow the DASH eating plan. To follow this plan: Fill one half of your plate at each meal with fruits and vegetables. Fill one fourth of your plate  at each meal with whole grains. Whole grains include whole-wheat pasta, brown rice, and whole-grain bread. Eat or drink low-fat dairy products, such as skim milk or low-fat yogurt. Fill one fourth of your plate at each meal with low-fat (lean) proteins. Low-fat proteins include fish, chicken without skin, eggs, beans, and tofu. Avoid fatty meat, cured and processed meat, or chicken with skin. Avoid pre-made or processed food. Limit the amount of salt in your diet to less than 1,500 mg each day. Do not drink alcohol if: Your doctor tells you not to drink. You are pregnant, may be pregnant, or are planning to become pregnant. If you drink alcohol: Limit how much you have to: 0-1 drink a day for women. 0-2 drinks a day for men. Know how much alcohol is in your drink. In the [place] one drink equals one 12 oz bottle of beer (355 mL), one 5 oz glass of wine (148 mL), or one 1 oz glass of hard liquor (44 mL). Lifestyle  Work with your doctor to stay at a healthy weight or to lose weight. Ask your doctor what the best weight is for you. Get at least 30 minutes of exercise that causes your heart to beat faster (aerobic exercise) most days of the week. This may include walking, swimming, or biking. Get at least 30 minutes of exercise that strengthens your muscles (resistance exercise) at least 3 days a week. This may include lifting weights or doing Pilates. Do not smoke or use any products that contain nicotine or tobacco. If you need help quitting, ask your doctor. Check your blood pressure at home as told by your doctor. Keep all follow-up visits. Medicines Take over-the-counter and prescription medicines   only as told by your doctor. Follow directions carefully. Do not skip doses of blood pressure medicine. The medicine does not work as well if you skip doses. Skipping doses also puts you at risk for problems. Ask your doctor about side effects or reactions to medicines that you should watch  for. Contact a doctor if: You think you are having a reaction to the medicine you are taking. You have headaches that keep coming back. You feel dizzy. You have swelling in your ankles. You have trouble with your vision. Get help right away if: You get a very bad headache. You start to feel mixed up (confused). You feel weak or numb. You feel faint. You have very bad pain in your: Chest. Belly (abdomen). You vomit more than once. You have trouble breathing. These symptoms may be an emergency. Get help right away. Call 911. Do not wait to see if the symptoms will go away. Do not drive yourself to the hospital. Summary Hypertension is another name for high blood pressure. High blood pressure forces your heart to work harder to pump blood. For most people, a normal blood pressure is less than 120/80. Making healthy choices can help lower blood pressure. If your blood pressure does not get lower with healthy choices, you may need to take medicine. This information is not intended to replace advice given to you by your health care provider. Make sure you discuss any questions you have with your health care provider. Document Revised: 06/03/2021 Document Reviewed: 06/03/2021 Elsevier Patient Education  2024 [employer]vier Inc.  

## 2023-02-07 ENCOUNTER — Ambulatory Visit: Payer: BC Managed Care – PPO

## 2023-02-08 LAB — HM MAMMOGRAPHY

## 2023-02-14 ENCOUNTER — Ambulatory Visit: Payer: BC Managed Care – PPO

## 2023-03-21 ENCOUNTER — Other Ambulatory Visit: Payer: Self-pay | Admitting: Internal Medicine

## 2023-03-21 DIAGNOSIS — I10 Essential (primary) hypertension: Secondary | ICD-10-CM

## 2023-04-01 DIAGNOSIS — M1712 Unilateral primary osteoarthritis, left knee: Secondary | ICD-10-CM | POA: Diagnosis not present

## 2023-04-21 NOTE — Patient Instructions (Signed)

## 2023-04-24 ENCOUNTER — Ambulatory Visit: Payer: BC Managed Care – PPO | Admitting: Internal Medicine

## 2023-04-24 ENCOUNTER — Encounter: Payer: Self-pay | Admitting: Internal Medicine

## 2023-04-24 VITALS — BP 124/78 | HR 84 | Temp 98.3°F | Ht 63.0 in | Wt 325.2 lb

## 2023-04-24 DIAGNOSIS — E1159 Type 2 diabetes mellitus with other circulatory complications: Secondary | ICD-10-CM

## 2023-04-24 DIAGNOSIS — E7800 Pure hypercholesterolemia, unspecified: Secondary | ICD-10-CM | POA: Diagnosis not present

## 2023-04-24 DIAGNOSIS — I152 Hypertension secondary to endocrine disorders: Secondary | ICD-10-CM | POA: Diagnosis not present

## 2023-04-24 DIAGNOSIS — E1169 Type 2 diabetes mellitus with other specified complication: Secondary | ICD-10-CM

## 2023-04-24 DIAGNOSIS — E669 Obesity, unspecified: Secondary | ICD-10-CM | POA: Diagnosis not present

## 2023-04-24 DIAGNOSIS — D500 Iron deficiency anemia secondary to blood loss (chronic): Secondary | ICD-10-CM | POA: Diagnosis not present

## 2023-04-24 DIAGNOSIS — Z6841 Body Mass Index (BMI) 40.0 and over, adult: Secondary | ICD-10-CM

## 2023-04-24 NOTE — Progress Notes (Signed)
I,[name], CMA,acting as a Neurosurgeon for [name], MD.,have documented all relevant documentation on the behalf of [name], MD,as directed by  [name], MD while in the presence of [name], MD.  Subjective:  Patient ID: Ashley Harrison , female    DOB: 06-16-1971 , 52 y.o.   MRN: [id number]  Chief Complaint  Patient presents with   Diabetes   Hypertension    HPI  Patient presents today for diabetes follow up. She reports compliance with medications.  She is now taking atorvastatin most days.  She has not had any issues with any other medication.  She is hesitant to increase the dose of the Rybelsus because she does not want to experience any GI side effects.  Denies headache, chest pain, and SOB. She reports having an eye exam scheduled for next month. She denies having any other questions or concerns at this time.   Diabetes She presents for her follow-up diabetic visit. She has type 2 diabetes mellitus. Her disease course has been stable. There are no hypoglycemic associated symptoms. Pertinent negatives for hypoglycemia include no headaches. Associated symptoms include fatigue. Pertinent negatives for diabetes include no blurred vision and no chest pain. There are no hypoglycemic complications. Risk factors for coronary artery disease include diabetes mellitus, hypertension, obesity and sedentary lifestyle.  Hypertension This is a chronic problem. The current episode started more than 1 year ago. The problem has been gradually worsening since onset. The problem is uncontrolled. Pertinent negatives include no anxiety, blurred vision, chest pain, headaches, peripheral edema or shortness of breath. There are no associated agents to hypertension. Risk factors for coronary artery disease include obesity and sedentary lifestyle. Past treatments include angiotensin blockers and diuretics. There are no compliance problems.  There is no history of angina or kidney  disease.     Past Medical History:  Diagnosis Date   Hypertension    Iron deficiency anemia due to chronic blood loss 10/31/2017   Iron deficiency anemia, unspecified    Menometrorrhagia 10/31/2017   Obesity      Family History  Problem Relation Age of Onset   Diabetes Mother    Hyperlipidemia Father    Hypertension Father      Current Outpatient Medications:    amLODipine (NORVASC) 2.5 MG tablet, Take 1 tablet by mouth every night, Disp: 90 tablet, Rfl: 2   atorvastatin (LIPITOR) 20 MG tablet, Take 1 tablet (20 mg total) by mouth daily., Disp: 30 tablet, Rfl: 11   Blood Pressure Monitor DEVI, Use to check blood pressure daily. Please dispense XL blood pressure cuff if covered by insurance. I10.0., Disp: 1 each, Rfl: 0   Multiple Vitamins-Minerals (MULTIVITAMIN WITH MINERALS) tablet, Take 2 tablets by mouth daily., Disp: , Rfl:    RYBELSUS 3 MG TABS, Take 1 tablet by mouth once daily, Disp: 30 tablet, Rfl: 0   valsartan-hydrochlorothiazide (DIOVAN-HCT) 160-12.5 MG tablet, Take 1 tablet by mouth once daily, Disp: 90 tablet, Rfl: 0   ketoconazole (NIZORAL) 2 % cream, Apply 1 Application topically daily. (Patient not taking: Reported on 01/24/2023), Disp: 60 g, Rfl: 0   Allergies  Allergen Reactions   Shellfish Allergy Hives     Review of Systems  Constitutional:  Positive for fatigue.  HENT: Negative.    Eyes: Negative.  Negative for blurred vision.  Respiratory: Negative.  Negative for shortness of breath.   Cardiovascular: Negative.  Negative for chest pain.  Gastrointestinal: Negative.   Neurological: Negative.  Negative for headaches.  Psychiatric/Behavioral: Negative.       Today's Vitals   04/24/23 1546 04/24/23 1607  BP: (!) 140/98 124/78  Pulse: 84   Temp: 98.3 F (36.8 C)   SpO2: 95%   Weight: (!) 325 lb 3.2 oz (147.5 kg)   Height: 5\' 3"  (1.6 m)    Body mass index is 57.61 kg/m.  Wt Readings from Last 3 Encounters:  04/24/23 (!) 325 lb 3.2 oz (147.5 kg)   01/25/23 (!) 332 lb 6.4 oz (150.8 kg)  12/21/22 (!) 329 lb (149.2 kg)     Objective:  Physical Exam Vitals and nursing note reviewed.  Constitutional:      Appearance: Normal appearance. She is obese.  HENT:     Head: Normocephalic and atraumatic.  Eyes:     Extraocular Movements: Extraocular movements intact.  Cardiovascular:     Rate and Rhythm: Normal rate and regular rhythm.     Heart sounds: Normal heart sounds.  Pulmonary:     Effort: Pulmonary effort is normal.     Breath sounds: Normal breath sounds.  Musculoskeletal:     Cervical back: Normal range of motion.  Skin:    General: Skin is warm.  Neurological:     General: No focal deficit present.     Mental Status: She is alert.  Psychiatric:        Mood and Affect: Mood normal.        Behavior: Behavior normal.         Assessment And Plan:  Obesity, diabetes, and hypertension syndrome (HCC) Assessment & Plan: Chronic, I will check labs as below. She will continue with Rybelsus 3mg  daily.  I do think she will benefit from dose increase to 7mg  daily. However, due to her concern for side effects, we will maintain 3mg  dose for now. BP is controlled.  She will continue with amlodipine 2.5mg  and valsartan/hct 160/12.5mg  daily. She is encouraged to aim for at least 150 minutes of exercise per week.   Orders: -     Hemoglobin A1c -     BMP8+eGFR  Pure hypercholesterolemia Assessment & Plan: Chronic, LDL goal is less than 70. She will continue with atorvastatin 20mg  daily.   Orders: -     Lipid panel -     ALT  Iron deficiency anemia due to chronic blood loss Assessment & Plan: Chronic, I will check labs as below. I will replete iron as needed. Importance of compliance with supplementation was discussed with the patient.   Orders: -     CBC -     Iron, TIBC and Ferritin Panel  Class 3 severe obesity due to excess calories with serious comorbidity and body mass index (BMI) of 50.0 to 59.9 in adult  [facility]) Assessment & Plan: Chronic, she was congratulated on her 7lb weight loss since May 2024.  She is encouraged to initialy strive for BMI less than 50 to decrease cardiac risk. Advised to aim for at least 150 minutes of exercise per week.    She is encouraged to strive for BMI less than 30 to decrease cardiac risk. Advised to aim for at least 150 minutes of exercise per week.    Return in 3 months (on 07/25/2023), or dm check.  Patient was given opportunity to ask questions. Patient verbalized understanding of the plan and was able to repeat key elements of the plan. All questions were answered to their satisfaction.    I, [name], MD, have reviewed  all documentation for this visit. The documentation on 04/24/23 for the exam, diagnosis, procedures, and orders are all accurate and complete.   IF YOU HAVE BEEN REFERRED TO A SPECIALIST, IT MAY TAKE 1-2 WEEKS TO SCHEDULE/PROCESS THE REFERRAL. IF YOU HAVE NOT HEARD FROM US/SPECIALIST IN TWO WEEKS, PLEASE GIVE [place] A CALL AT [phone].   THE PATIENT IS ENCOURAGED TO PRACTICE SOCIAL DISTANCING DUE TO THE COVID-19 PANDEMIC.

## 2023-04-25 LAB — HEMOGLOBIN A1C
Est. average glucose Bld gHb Est-mCnc: 140 mg/dL
Hgb A1c MFr Bld: 6.5 % — ABNORMAL HIGH (ref 4.8–5.6)

## 2023-04-25 LAB — CBC
Hematocrit: 38.9 % (ref 34.0–46.6)
Hemoglobin: 12.3 g/dL (ref 11.1–15.9)
MCH: 25.3 pg — ABNORMAL LOW (ref 26.6–33.0)
MCHC: 31.6 g/dL (ref 31.5–35.7)
MCV: 80 fL (ref 79–97)
Platelets: 374 10*3/uL (ref 150–450)
RBC: 4.86 x10E6/uL (ref 3.77–5.28)
RDW: 14.5 % (ref 11.7–15.4)
WBC: 8.7 10*3/uL (ref 3.4–10.8)

## 2023-04-25 LAB — BMP8+EGFR
BUN/Creatinine Ratio: 12 (ref 9–23)
BUN: 8 mg/dL (ref 6–24)
CO2: 23 mmol/L (ref 20–29)
Calcium: 10 mg/dL (ref 8.7–10.2)
Chloride: 101 mmol/L (ref 96–106)
Creatinine, Ser: 0.66 mg/dL (ref 0.57–1.00)
Glucose: 89 mg/dL (ref 70–99)
Potassium: 4.1 mmol/L (ref 3.5–5.2)
Sodium: 140 mmol/L (ref 134–144)
eGFR: 105 mL/min/{1.73_m2} (ref >59)

## 2023-04-25 LAB — ALT: ALT: 13 IU/L (ref 0–32)

## 2023-04-25 LAB — LIPID PANEL
Chol/HDL Ratio: 2.8 ratio (ref 0.0–4.4)
Cholesterol, Total: 220 mg/dL — ABNORMAL HIGH (ref 100–199)
HDL: 79 mg/dL (ref >39)
LDL Chol Calc (NIH): 130 mg/dL — ABNORMAL HIGH (ref 0–99)
Triglycerides: 62 mg/dL (ref 0–149)
VLDL Cholesterol Cal: 11 mg/dL (ref 5–40)

## 2023-04-25 LAB — IRON,TIBC AND FERRITIN PANEL
Ferritin: 210 ng/mL — ABNORMAL HIGH (ref 15–150)
Iron Saturation: 11 % — ABNORMAL LOW (ref 15–55)
Iron: 41 ug/dL (ref 27–159)
Total Iron Binding Capacity: 364 ug/dL (ref 250–450)
UIBC: 323 ug/dL (ref 131–425)

## 2023-04-28 ENCOUNTER — Encounter: Payer: Self-pay | Admitting: Internal Medicine

## 2023-05-01 DIAGNOSIS — E7800 Pure hypercholesterolemia, unspecified: Secondary | ICD-10-CM | POA: Insufficient documentation

## 2023-05-01 NOTE — Assessment & Plan Note (Signed)
Chronic, I will check labs as below. I will replete iron as needed. Importance of compliance with supplementation was discussed with the patient.

## 2023-05-01 NOTE — Assessment & Plan Note (Addendum)
Chronic, I will check labs as below. She will continue with Rybelsus 3mg  daily.  I do think she will benefit from dose increase to 7mg  daily. However, due to her concern for side effects, we will maintain 3mg  dose for now. BP is controlled.  She will continue with amlodipine 2.5mg  and valsartan/hct 160/12.5mg  daily. She is encouraged to aim for at least 150 minutes of exercise per week.

## 2023-05-01 NOTE — Assessment & Plan Note (Signed)
Chronic, she was congratulated on her 7lb weight loss since May 2024.  She is encouraged to initialy strive for BMI less than 50 to decrease cardiac risk. Advised to aim for at least 150 minutes of exercise per week.

## 2023-05-01 NOTE — Assessment & Plan Note (Signed)
Chronic, LDL goal is less than 70. She will continue with atorvastatin 20mg  daily.

## 2023-05-09 DIAGNOSIS — M1712 Unilateral primary osteoarthritis, left knee: Secondary | ICD-10-CM | POA: Diagnosis not present

## 2023-05-10 ENCOUNTER — Other Ambulatory Visit: Payer: Self-pay | Admitting: Orthopedic Surgery

## 2023-05-10 DIAGNOSIS — M25562 Pain in left knee: Secondary | ICD-10-CM

## 2023-05-11 DIAGNOSIS — E119 Type 2 diabetes mellitus without complications: Secondary | ICD-10-CM | POA: Diagnosis not present

## 2023-05-11 LAB — HM DIABETES EYE EXAM

## 2023-06-06 ENCOUNTER — Ambulatory Visit
Admission: RE | Admit: 2023-06-06 | Discharge: 2023-06-06 | Disposition: A | Discharge status: Home / Self Care | Payer: BC Managed Care – PPO | Source: Ambulatory Visit | Attending: Orthopedic Surgery | Admitting: Orthopedic Surgery

## 2023-06-06 DIAGNOSIS — M25562 Pain in left knee: Secondary | ICD-10-CM | POA: Diagnosis not present

## 2023-06-06 DIAGNOSIS — M23362 Other meniscus derangements, other lateral meniscus, left knee: Secondary | ICD-10-CM | POA: Diagnosis not present

## 2023-06-06 DIAGNOSIS — M23322 Other meniscus derangements, posterior horn of medial meniscus, left knee: Secondary | ICD-10-CM | POA: Diagnosis not present

## 2023-06-22 DIAGNOSIS — M1712 Unilateral primary osteoarthritis, left knee: Secondary | ICD-10-CM | POA: Diagnosis not present

## 2023-06-28 DIAGNOSIS — H40003 Preglaucoma, unspecified, bilateral: Secondary | ICD-10-CM | POA: Diagnosis not present

## 2023-07-24 ENCOUNTER — Ambulatory Visit: Payer: BC Managed Care – PPO | Admitting: Internal Medicine

## 2023-08-01 ENCOUNTER — Ambulatory Visit: Payer: Self-pay | Admitting: Internal Medicine

## 2023-08-15 ENCOUNTER — Ambulatory Visit: Payer: BC Managed Care – PPO | Admitting: Internal Medicine

## 2023-08-15 DIAGNOSIS — Z23 Encounter for immunization: Secondary | ICD-10-CM

## 2023-08-15 DIAGNOSIS — I10 Essential (primary) hypertension: Secondary | ICD-10-CM

## 2023-08-15 DIAGNOSIS — E7800 Pure hypercholesterolemia, unspecified: Secondary | ICD-10-CM

## 2023-08-15 DIAGNOSIS — M1712 Unilateral primary osteoarthritis, left knee: Secondary | ICD-10-CM

## 2023-08-15 DIAGNOSIS — E1169 Type 2 diabetes mellitus with other specified complication: Secondary | ICD-10-CM | POA: Diagnosis not present

## 2023-08-15 LAB — HEMOGLOBIN A1C
Est. average glucose Bld gHb Est-mCnc: 137 mg/dL
Hgb A1c MFr Bld: 6.4 % — ABNORMAL HIGH (ref 4.8–5.6)

## 2023-08-15 LAB — CMP14+EGFR
ALT: 17 [IU]/L (ref 0–32)
AST: 14 [IU]/L (ref 0–40)
Albumin: 4 g/dL (ref 3.8–4.9)
Alkaline Phosphatase: 163 [IU]/L — ABNORMAL HIGH (ref 44–121)
BUN/Creatinine Ratio: 14 (ref 9–23)
BUN: 10 mg/dL (ref 6–24)
Bilirubin Total: 0.5 mg/dL (ref 0.0–1.2)
CO2: 23 mmol/L (ref 20–29)
Calcium: 9.7 mg/dL (ref 8.7–10.2)
Chloride: 102 mmol/L (ref 96–106)
Creatinine, Ser: 0.71 mg/dL (ref 0.57–1.00)
Globulin, Total: 2.6 g/dL (ref 1.5–4.5)
Glucose: 118 mg/dL — ABNORMAL HIGH (ref 70–99)
Potassium: 4.2 mmol/L (ref 3.5–5.2)
Sodium: 141 mmol/L (ref 134–144)
Total Protein: 6.6 g/dL (ref 6.0–8.5)
eGFR: 102 mL/min/{1.73_m2} (ref >59)

## 2023-08-15 LAB — LIPID PANEL
Chol/HDL Ratio: 2.8 {ratio} (ref 0.0–4.4)
Cholesterol, Total: 197 mg/dL (ref 100–199)
HDL: 70 mg/dL (ref >39)
LDL Chol Calc (NIH): 114 mg/dL — ABNORMAL HIGH (ref 0–99)
Triglycerides: 69 mg/dL (ref 0–149)
VLDL Cholesterol Cal: 13 mg/dL (ref 5–40)

## 2023-08-15 NOTE — Assessment & Plan Note (Signed)
Chronic, she was given Dexcom and taught how to use the app to follow her sugars. For now, she will continue with Rybelsus 3mg  daily. Ideally, we should increase the dose as tolerated to optimize the weight loss benefits. Will consider at her next visit. She will f/u in four months for re-evaluation.

## 2023-08-15 NOTE — Progress Notes (Unsigned)
I,Jameka J Llittleton, CMA,acting as a Neurosurgeon for [name], MD.,have documented all relevant documentation on the behalf of [name], MD,as directed by  [name], MD while in the presence of [name], MD.  Subjective:  Patient ID: Ashley Harrison , female    DOB: [birth year] , 52 y.o.   MRN: [id number]  Chief Complaint  Patient presents with   Hypertension   Diabetes    HPI  Patient presents today for diabetes follow up. She reports compliance with medications.  Denies headache, chest pain, and SOB. She denies having any other questions or concerns at this time.   Diabetes She presents for her follow-up diabetic visit. She has type 2 diabetes mellitus. Her disease course has been stable. There are no hypoglycemic associated symptoms. Pertinent negatives for hypoglycemia include no headaches. Pertinent negatives for diabetes include no blurred vision, no chest pain, no polydipsia, no polyphagia and no polyuria. There are no hypoglycemic complications. Risk factors for coronary artery disease include diabetes mellitus, hypertension, obesity and sedentary lifestyle.  Hypertension This is a chronic problem. The current episode started more than 1 year ago. The problem has been gradually worsening since onset. The problem is uncontrolled. Pertinent negatives include no anxiety, blurred vision, chest pain, headaches, peripheral edema or shortness of breath. There are no associated agents to hypertension. Risk factors for coronary artery disease include obesity and sedentary lifestyle. Past treatments include angiotensin blockers and diuretics. There are no compliance problems.  There is no history of angina or kidney disease.     Past Medical History:  Diagnosis Date   Hypertension    Iron deficiency anemia due to chronic blood loss 10/31/2017   Iron deficiency anemia, unspecified    Menometrorrhagia 10/31/2017   Obesity      Family History  Problem Relation Age of  Onset   Diabetes Mother    Hyperlipidemia Father    Hypertension Father      Current Outpatient Medications:    amLODipine (NORVASC) 2.5 MG tablet, Take 1 tablet by mouth every night, Disp: 90 tablet, Rfl: 2   atorvastatin (LIPITOR) 20 MG tablet, Take 1 tablet (20 mg total) by mouth daily., Disp: 30 tablet, Rfl: 11   Blood Pressure Monitor DEVI, Use to check blood pressure daily. Please dispense XL blood pressure cuff if covered by insurance. I10.0., Disp: 1 each, Rfl: 0   Multiple Vitamins-Minerals (MULTIVITAMIN WITH MINERALS) tablet, Take 2 tablets by mouth daily., Disp: , Rfl:    RYBELSUS 3 MG TABS, Take 1 tablet by mouth once daily, Disp: 30 tablet, Rfl: 0   valsartan-hydrochlorothiazide (DIOVAN-HCT) 160-12.5 MG tablet, Take 1 tablet by mouth once daily, Disp: 90 tablet, Rfl: 0   Allergies  Allergen Reactions   Shellfish Allergy Hives     Review of Systems  Constitutional: Negative.   Eyes: Negative.  Negative for blurred vision.  Respiratory:  Negative for shortness of breath.   Cardiovascular:  Negative for chest pain.  Endocrine: Negative for polydipsia, polyphagia and polyuria.  Musculoskeletal: Negative.   Skin: Negative.   Neurological:  Negative for headaches.  Psychiatric/Behavioral: Negative.       Today's Vitals   08/15/23 0833  BP: 132/80  Pulse: 93  Temp: 98.1 F (36.7 C)  TempSrc: Oral  Weight: (!) 323 lb (146.5 kg)  Height: 5\' 3"  (1.6 m)  PainSc: 0-No pain   Body mass index is 57.22 kg/m.  Wt Readings from Last 3 Encounters:  08/15/23 (!) 323 lb (  146.5 kg)  04/24/23 (!) 325 lb 3.2 oz (147.5 kg)  01/25/23 (!) 332 lb 6.4 oz (150.8 kg)    The 10-year ASCVD risk score (Arnett DK, et al., 2019) is: 7.8%   Values used to calculate the score:     Age: 79 years     Sex: Female     Is Non-Hispanic African American: Yes     Diabetic: Yes     Tobacco smoker: No     Systolic Blood Pressure: 132 mmHg     Is BP treated: Yes     HDL Cholesterol: 71  mg/dL     Total Cholesterol: 184 mg/dL  Objective:  Physical Exam Vitals and nursing note reviewed.  Constitutional:      Appearance: Normal appearance. She is obese.  HENT:     Head: Normocephalic and atraumatic.  Eyes:     Extraocular Movements: Extraocular movements intact.  Cardiovascular:     Rate and Rhythm: Normal rate and regular rhythm.     Heart sounds: Normal heart sounds.  Pulmonary:     Effort: Pulmonary effort is normal.     Breath sounds: Normal breath sounds.  Musculoskeletal:     Cervical back: Normal range of motion.  Skin:    General: Skin is warm.  Neurological:     General: No focal deficit present.     Mental Status: She is alert.  Psychiatric:        Mood and Affect: Mood normal.        Behavior: Behavior normal.         Assessment And Plan:  Type 2 diabetes mellitus with morbid obesity (HCC) -     CMP14+EGFR -     Hemoglobin A1c  Pure hypercholesterolemia  Primary osteoarthritis of left knee  Class 3 severe obesity due to excess calories with serious comorbidity and body mass index (BMI) of 50.0 to 59.9 in adult [facility])  Immunization due -     PFIZER Comirnaty(GRAY TOP)COVID-19 Vaccine   Return if symptoms worsen or fail to improve.  Patient was given opportunity to ask questions. Patient verbalized understanding of the plan and was able to repeat key elements of the plan. All questions were answered to their satisfaction.    I, [name], MD, have reviewed all documentation for this visit. The documentation on 08/15/23 for the exam, diagnosis, procedures, and orders are all accurate and complete.   IF YOU HAVE BEEN REFERRED TO A SPECIALIST, IT MAY TAKE 1-2 WEEKS TO SCHEDULE/PROCESS THE REFERRAL. IF YOU HAVE NOT HEARD FROM US/SPECIALIST IN TWO WEEKS, PLEASE GIVE [place] A CALL AT [phone].

## 2023-08-16 NOTE — Assessment & Plan Note (Signed)
Chronic, she has been evaluated by Ortho. Unfortunately, this has affected her ability to exercise. She is encouraged to consider water exercises, this would be less pressure on her joints. She is encouraged to follow an anti-inflammatory diet. s

## 2023-08-16 NOTE — Assessment & Plan Note (Addendum)
Chronic, fair control,. Goal BP<120/80.  For now, she will continue with amlodipine 2.5mg  daily and valsartan/hct 160/12.5mg  daily. She is encouraged to follow low sodium diet and to incorporate more exercise into her daily routine. She will f/u in four months.

## 2023-08-16 NOTE — Assessment & Plan Note (Signed)
Chronic, LDL goal is less than 70.  She will continue with atorvastatin 20mg  daily. She is encouraged to follow a heart healthy lifestyle.

## 2023-10-02 ENCOUNTER — Encounter: Payer: Self-pay | Admitting: Internal Medicine

## 2023-10-02 ENCOUNTER — Ambulatory Visit (INDEPENDENT_AMBULATORY_CARE_PROVIDER_SITE_OTHER): Payer: BC Managed Care – PPO | Admitting: Internal Medicine

## 2023-10-02 VITALS — BP 152/110 | HR 81 | Temp 98.1°F | Ht 63.0 in | Wt 336.6 lb

## 2023-10-02 DIAGNOSIS — E1169 Type 2 diabetes mellitus with other specified complication: Secondary | ICD-10-CM | POA: Diagnosis not present

## 2023-10-02 DIAGNOSIS — Z0000 Encounter for general adult medical examination without abnormal findings: Secondary | ICD-10-CM | POA: Insufficient documentation

## 2023-10-02 DIAGNOSIS — M1712 Unilateral primary osteoarthritis, left knee: Secondary | ICD-10-CM | POA: Diagnosis not present

## 2023-10-02 DIAGNOSIS — I10 Essential (primary) hypertension: Secondary | ICD-10-CM

## 2023-10-02 DIAGNOSIS — D500 Iron deficiency anemia secondary to blood loss (chronic): Secondary | ICD-10-CM

## 2023-10-02 LAB — POCT URINALYSIS DIPSTICK
Bilirubin, UA: NEGATIVE
Blood, UA: NEGATIVE
Glucose, UA: NEGATIVE
Ketones, UA: NEGATIVE
Nitrite, UA: POSITIVE
Protein, UA: POSITIVE — AB
Spec Grav, UA: 1.025 (ref 1.010–1.025)
Urobilinogen, UA: 1 U/dL
pH, UA: 6.5 (ref 5.0–8.0)

## 2023-10-02 NOTE — Progress Notes (Unsigned)
I,Ashley Harrison, CMA,acting as a Neurosurgeon for Ashley Aliment, MD.,have documented all relevant documentation on the behalf of Ashley Aliment, MD,as directed by  Ashley Aliment, MD while in the presence of Ashley Aliment, MD.  Subjective:    Patient ID: Ashley Harrison , female    DOB: 1971-06-13 , 53 y.o.   MRN: [id number]  Chief Complaint  Patient presents with   Annual Exam   Diabetes   Hypertension   Hyperlipidemia    HPI  The patient is here today for a physical examination. She is followed by Dr. Denny Harrison for her GYN exams. She has no specific questions or concerns. Denies headache, chest pain & sob.  She admits not taking bp medication this morning.   Hypertension This is a chronic problem. The current episode started more than 1 year ago. The problem has been gradually worsening since onset. The problem is uncontrolled. Pertinent negatives include no anxiety or peripheral edema. There are no associated agents to hypertension. Risk factors for coronary artery disease include obesity and sedentary lifestyle. Past treatments include angiotensin blockers and diuretics. There are no compliance problems.  There is no history of angina or kidney disease.     Past Medical History:  Diagnosis Date   Hypertension    Iron deficiency anemia due to chronic blood loss 10/31/2017   Iron deficiency anemia, unspecified    Menometrorrhagia 10/31/2017   Obesity      Family History  Problem Relation Age of Onset   Diabetes Mother    Hyperlipidemia Father    Hypertension Father      Current Outpatient Medications:    amLODipine (NORVASC) 2.5 MG tablet, Take 1 tablet by mouth every night, Disp: 90 tablet, Rfl: 2   Blood Pressure Monitor DEVI, Use to check blood pressure daily. Please dispense XL blood pressure cuff if covered by insurance. I10.0., Disp: 1 each, Rfl: 0   Multiple Vitamins-Minerals (MULTIVITAMIN WITH MINERALS) tablet, Take 2 tablets by mouth daily., Disp: , Rfl:     RYBELSUS 3 MG TABS, Take 1 tablet by mouth once daily, Disp: 30 tablet, Rfl: 0   valsartan-hydrochlorothiazide (DIOVAN-HCT) 160-12.5 MG tablet, Take 1 tablet by mouth once daily, Disp: 90 tablet, Rfl: 0   atorvastatin (LIPITOR) 20 MG tablet, Take 1 tablet (20 mg total) by mouth daily., Disp: 30 tablet, Rfl: 11   Allergies  Allergen Reactions   Shellfish Allergy Hives      The patient states she uses none for birth control. No LMP recorded. Patient has had an ablation.. Negative for Dysmenorrhea. Negative for: breast discharge, breast lump(s), breast pain and breast self exam. Associated symptoms include abnormal vaginal bleeding. Pertinent negatives include abnormal bleeding (hematology), anxiety, decreased libido, depression, difficulty falling sleep, dyspareunia, history of infertility, nocturia, sexual dysfunction, sleep disturbances, urinary incontinence, urinary urgency, vaginal discharge and vaginal itching. Diet regular.The patient states her exercise level is    . The patient's tobacco use is:  Social History   Tobacco Use  Smoking Status Never  Smokeless Tobacco Never  . She has been exposed to passive smoke. The patient's alcohol use is:  Social History   Substance and Sexual Activity  Alcohol Use No     Review of Systems  Constitutional: Negative.   HENT: Negative.    Eyes: Negative.   Respiratory: Negative.    Cardiovascular: Negative.   Gastrointestinal: Negative.   Endocrine: Negative.   Genitourinary: Negative.   Musculoskeletal: Negative.   Skin: Negative.  Allergic/Immunologic: Negative.   Neurological: Negative.   Hematological: Negative.   Psychiatric/Behavioral: Negative.       Today's Vitals   10/02/23 1009 10/02/23 1030  BP: (!) 150/110 (!) 152/110  Pulse: 81   Temp: 98.1 F (36.7 C)   SpO2: 98%   Weight: (!) 336 lb 9.6 oz (152.7 kg)   Height: 5\' 3"  (1.6 m)    Body mass index is 59.63 kg/m.  Wt Readings from Last 3 Encounters:  10/02/23 (!)  336 lb 9.6 oz (152.7 kg)  08/15/23 (!) 323 lb (146.5 kg)  04/24/23 (!) 325 lb 3.2 oz (147.5 kg)    BP Readings from Last 3 Encounters:  10/02/23 (!) 152/110  08/15/23 132/80  04/24/23 124/78    Objective:  Physical Exam Vitals and nursing note reviewed.  Constitutional:      Appearance: Normal appearance. She is obese.     Comments: She kept pants/shoes on for exam.   HENT:     Head: Normocephalic and atraumatic.     Right Ear: Tympanic membrane, ear canal and external ear normal.     Left Ear: Tympanic membrane, ear canal and external ear normal.  Eyes:     Extraocular Movements: Extraocular movements intact.     Conjunctiva/sclera: Conjunctivae normal.     Pupils: Pupils are equal, round, and reactive to light.  Cardiovascular:     Rate and Rhythm: Normal rate and regular rhythm.     Pulses: Normal pulses.          Dorsalis pedis pulses are 2+ on the right side and 2+ on the left side.     Heart sounds: Normal heart sounds.  Pulmonary:     Effort: Pulmonary effort is normal.     Breath sounds: Normal breath sounds.  Chest:  Breasts:    Tanner Score is 5.     Right: Normal.     Left: Normal.  Abdominal:     General: Bowel sounds are normal.     Palpations: Abdomen is soft.     Comments: Obese, difficult to assess organomegaly   Genitourinary:    Comments: deferred Musculoskeletal:        General: Normal range of motion.     Cervical back: Normal range of motion and neck supple.     Right lower leg: Edema present.     Left lower leg: Edema present.     Comments: POS crepitus  Feet:     Right foot:     Protective Sensation: 5 sites tested.  5 sites sensed.     Skin integrity: Dry skin present.     Toenail Condition: Right toenails are long.     Left foot:     Protective Sensation: 5 sites tested.  5 sites sensed.     Skin integrity: Dry skin present.     Toenail Condition: Left toenails are long.  Skin:    General: Skin is warm and dry.     Comments: Brawny  skin b/l LE L>R  Neurological:     General: No focal deficit present.     Mental Status: She is alert and oriented to person, place, and time.  Psychiatric:        Mood and Affect: Mood normal.        Behavior: Behavior normal.         Assessment And Plan:     Encounter for general adult medical examination w/o abnormal findings Assessment & Plan: A full exam was performed.  Importance of monthly self breast exams was discussed with the patient.  She is advised to get 30-45 minutes of regular exercise, no less than four to five days per week. Both weight-bearing and aerobic exercises are recommended.  She is advised to follow a healthy diet with at least six fruits/veggies per day, decrease intake of red meat and other saturated fats and to increase fish intake to twice weekly.  Meats/fish should not be fried -- baked, boiled or broiled is preferable. It is also important to cut back on your sugar intake.  Be sure to read labels - try to avoid anything with added sugar, high fructose corn syrup or other sweeteners.  If you must use a sweetener, you can try stevia or monkfruit.  It is also important to avoid artificially sweetened foods/beverages and diet drinks. Lastly, wear SPF 50 sunscreen on exposed skin and when in direct sunlight for an extended period of time.  Be sure to avoid fast food restaurants and aim for at least 60 ounces of water daily.       Type 2 diabetes mellitus with morbid obesity (HCC) Assessment & Plan: Chronic, diabetic foot exam was not performed.  For now, she will continue with Rybelsus 3mg  daily. Ideally, we should increase the dose as tolerated to optimize the weight loss benefits. Will consider at her next visit. She will f/u in four months for re-evaluation. I DISCUSSED WITH THE PATIENT AT LENGTH REGARDING THE GOALS OF GLYCEMIC CONTROL AND POSSIBLE LONG-TERM COMPLICATIONS.  I  ALSO STRESSED THE IMPORTANCE OF COMPLIANCE WITH HOME GLUCOSE MONITORING, DIETARY  RESTRICTIONS INCLUDING AVOIDANCE OF SUGARY DRINKS/PROCESSED FOODS,  ALONG WITH REGULAR EXERCISE.  I  ALSO STRESSED THE IMPORTANCE OF ANNUAL EYE EXAMS, SELF FOOT CARE AND COMPLIANCE WITH OFFICE VISITS.   Orders: -     POCT urinalysis dipstick -     Microalbumin / creatinine urine ratio -     EKG 12-Lead  Essential hypertension, benign Assessment & Plan: Chronic, fair control,. Goal BP<120/80.  She admits not taking her BP meds this morning.  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  I will increase amlodipine to 2.5mg  TWO tabs daily. She will take this until she runs out of medication. I will send rx amlodipine 5mg  at that time.  She will also continue with valsartan/hct 160/12.5mg  daily. She is encouraged to follow low sodium diet and to incorporate more exercise into her daily routine. She agrees to f/u in two weeks for NV  I will see her in four months.   Orders: -     POCT urinalysis dipstick -     Microalbumin / creatinine urine ratio -     EKG 12-Lead  Primary osteoarthritis of left knee Assessment & Plan: Chronic, she has been evaluated by Ortho. Unfortunately, this has affected her ability to exercise. She is encouraged to consider water exercises, this would be less pressure on her joints. She is encouraged to follow an anti-inflammatory diet.    She is encouraged to strive for BMI less than 30 to decrease cardiac risk. Advised to aim for at least 150 minutes of exercise per week.    Return in 2 weeks (on 10/16/2023), or bp check NV, for 1 year physical, end of April, dm check. Patient was given opportunity to ask questions. Patient verbalized understanding of the plan and was able to repeat key elements of the plan. All questions were answered to their satisfaction.   I, Ashley Aliment, MD, have reviewed all  documentation for this visit. The documentation on 10/02/23 for the exam, diagnosis, procedures, and orders are all accurate and complete.

## 2023-10-02 NOTE — Patient Instructions (Addendum)
Please increase amlodipine 2.5mg  to TWO tablets daily.   Please send BP reading from work in two weeks.   Health Maintenance, Female Adopting a healthy lifestyle and getting preventive care are important in promoting health and wellness. Ask your health care provider about: The right schedule for you to have regular tests and exams. Things you can do on your own to prevent diseases and keep yourself healthy. What should I know about diet, weight, and exercise? Eat a healthy diet  Eat a diet that includes plenty of vegetables, fruits, low-fat dairy products, and lean protein. Do not eat a lot of foods that are high in solid fats, added sugars, or sodium. Maintain a healthy weight Body mass index (BMI) is used to identify weight problems. It estimates body fat based on height and weight. Your health care provider can help determine your BMI and help you achieve or maintain a healthy weight. Get regular exercise Get regular exercise. This is one of the most important things you can do for your health. Most adults should: Exercise for at least 150 minutes each week. The exercise should increase your heart rate and make you sweat (moderate-intensity exercise). Do strengthening exercises at least twice a week. This is in addition to the moderate-intensity exercise. Spend less time sitting. Even light physical activity can be beneficial. Watch cholesterol and blood lipids Have your blood tested for lipids and cholesterol at 55 years of age, then have this test every 5 years. Have your cholesterol levels checked more often if: Your lipid or cholesterol levels are high. You are older than 55 years of age. You are at high risk for heart disease. What should I know about cancer screening? Depending on your health history and family history, you may need to have cancer screening at various ages. This may include screening for: Breast cancer. Cervical cancer. Colorectal cancer. Skin cancer. Lung  cancer. What should I know about heart disease, diabetes, and high blood pressure? Blood pressure and heart disease High blood pressure causes heart disease and increases the risk of stroke. This is more likely to develop in people who have high blood pressure readings or are overweight. Have your blood pressure checked: Every 3-5 years if you are 38-11 years of age. Every year if you are 106 years old or older. Diabetes Have regular diabetes screenings. This checks your fasting blood sugar level. Have the screening done: Once every three years after age 69 if you are at a normal weight and have a low risk for diabetes. More often and at a younger age if you are overweight or have a high risk for diabetes. What should I know about preventing infection? Hepatitis B If you have a higher risk for hepatitis B, you should be screened for this virus. Talk with your health care provider to find out if you are at risk for hepatitis B infection. Hepatitis C Testing is recommended for: Everyone born from [birth year] through 1967. Anyone with known risk factors for hepatitis C. Sexually transmitted infections (STIs) Get screened for STIs, including gonorrhea and chlamydia, if: You are sexually active and are younger than 55 years of age. You are older than 55 years of age and your health care provider tells you that you are at risk for this type of infection. Your sexual activity has changed since you were last screened, and you are at increased risk for chlamydia or gonorrhea. Ask your health care provider if you are at risk. Ask your health care provider about  whether you are at high risk for HIV. Your health care provider may recommend a prescription medicine to help prevent HIV infection. If you choose to take medicine to prevent HIV, you should first get tested for HIV. You should then be tested every 3 months for as long as you are taking the medicine. Pregnancy If you are about to stop having your  period (premenopausal) and you may become pregnant, seek counseling before you get pregnant. Take 400 to 800 micrograms (mcg) of folic acid every day if you become pregnant. Ask for birth control (contraception) if you want to prevent pregnancy. Osteoporosis and menopause Osteoporosis is a disease in which the bones lose minerals and strength with aging. This can result in bone fractures. If you are 56 years old or older, or if you are at risk for osteoporosis and fractures, ask your health care provider if you should: Be screened for bone loss. Take a calcium or vitamin D supplement to lower your risk of fractures. Be given hormone replacement therapy (HRT) to treat symptoms of menopause. Follow these instructions at home: Alcohol use Do not drink alcohol if: Your health care provider tells you not to drink. You are pregnant, may be pregnant, or are planning to become pregnant. If you drink alcohol: Limit how much you have to: 0-1 drink a day. Know how much alcohol is in your drink. In the [place] one drink equals one 12 oz bottle of beer (355 mL), one 5 oz glass of wine (148 mL), or one 1 oz glass of hard liquor (44 mL). Lifestyle Do not use any products that contain nicotine or tobacco. These products include cigarettes, chewing tobacco, and vaping devices, such as e-cigarettes. If you need help quitting, ask your health care provider. Do not use street drugs. Do not share needles. Ask your health care provider for help if you need support or information about quitting drugs. General instructions Schedule regular health, dental, and eye exams. Stay current with your vaccines. Tell your health care provider if: You often feel depressed. You have ever been abused or do not feel safe at home. Summary Adopting a healthy lifestyle and getting preventive care are important in promoting health and wellness. Follow your health care provider's instructions about healthy diet, exercising, and  getting tested or screened for diseases. Follow your health care provider's instructions on monitoring your cholesterol and blood pressure. This information is not intended to replace advice given to you by your health care provider. Make sure you discuss any questions you have with your health care provider. Document Revised: 01/04/2021 Document Reviewed: 01/04/2021 Elsevier Patient Education  2024 [employer].

## 2023-10-03 ENCOUNTER — Encounter: Payer: Self-pay | Admitting: Internal Medicine

## 2023-10-03 LAB — MICROALBUMIN / CREATININE URINE RATIO
Creatinine, Urine: 175.6 mg/dL
Microalb/Creat Ratio: 26 mg/g{creat} (ref 0–29)
Microalbumin, Urine: 44.8 ug/mL

## 2023-10-03 NOTE — Assessment & Plan Note (Signed)
Chronic, she has been evaluated by Ortho. Unfortunately, this has affected her ability to exercise. She is encouraged to consider water exercises, this would be less pressure on her joints. She is encouraged to follow an anti-inflammatory diet.

## 2023-10-03 NOTE — Assessment & Plan Note (Addendum)
 Chronic, diabetic foot exam was not performed.  For now, she will continue with Rybelsus  3mg  daily. Ideally, we should increase the dose as tolerated to optimize the weight loss benefits. Will consider at her next visit. She will f/u in four months for re-evaluation. I DISCUSSED WITH THE PATIENT AT LENGTH REGARDING THE GOALS OF GLYCEMIC CONTROL AND POSSIBLE LONG-TERM COMPLICATIONS.  I  ALSO STRESSED THE IMPORTANCE OF COMPLIANCE WITH HOME GLUCOSE MONITORING, DIETARY RESTRICTIONS INCLUDING AVOIDANCE OF SUGARY DRINKS/PROCESSED FOODS,  ALONG WITH REGULAR EXERCISE.  I  ALSO STRESSED THE IMPORTANCE OF ANNUAL EYE EXAMS, SELF FOOT CARE AND COMPLIANCE WITH OFFICE VISITS.

## 2023-10-03 NOTE — Assessment & Plan Note (Addendum)
 Chronic, fair control,. Goal BP<120/80.  She admits not taking her BP meds this morning.  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  I will increase amlodipine  to 2.5mg  TWO tabs daily. She will take this until she runs out of medication. I will send rx amlodipine  5mg  at that time.  She will also continue with valsartan /hct 160/12.5mg  daily. She is encouraged to follow low sodium diet and to incorporate more exercise into her daily routine. She agrees to f/u in two weeks for NV  I will see her in four months.

## 2023-10-03 NOTE — Assessment & Plan Note (Signed)

## 2023-10-04 DIAGNOSIS — Z01411 Encounter for gynecological examination (general) (routine) with abnormal findings: Secondary | ICD-10-CM | POA: Diagnosis not present

## 2023-10-04 DIAGNOSIS — Z1151 Encounter for screening for human papillomavirus (HPV): Secondary | ICD-10-CM | POA: Diagnosis not present

## 2023-10-04 DIAGNOSIS — R82998 Other abnormal findings in urine: Secondary | ICD-10-CM | POA: Diagnosis not present

## 2023-10-04 DIAGNOSIS — Z130 Encounter for screening for diseases of the blood and blood-forming organs and certain disorders involving the immune mechanism: Secondary | ICD-10-CM | POA: Diagnosis not present

## 2023-10-04 DIAGNOSIS — N921 Excessive and frequent menstruation with irregular cycle: Secondary | ICD-10-CM | POA: Diagnosis not present

## 2023-10-04 DIAGNOSIS — Z124 Encounter for screening for malignant neoplasm of cervix: Secondary | ICD-10-CM | POA: Diagnosis not present

## 2023-10-10 LAB — HM PAP SMEAR: HPV, high-risk: NEGATIVE

## 2023-11-22 DIAGNOSIS — R051 Acute cough: Secondary | ICD-10-CM | POA: Diagnosis not present

## 2023-11-22 DIAGNOSIS — R062 Wheezing: Secondary | ICD-10-CM | POA: Diagnosis not present

## 2023-11-22 DIAGNOSIS — J189 Pneumonia, unspecified organism: Secondary | ICD-10-CM | POA: Diagnosis not present

## 2023-11-29 ENCOUNTER — Encounter: Payer: Self-pay | Admitting: Internal Medicine

## 2023-11-29 ENCOUNTER — Ambulatory Visit: Payer: Self-pay | Admitting: Internal Medicine

## 2023-11-29 VITALS — BP 170/112 | HR 91 | Temp 97.9°F | Ht 63.0 in | Wt 334.4 lb

## 2023-11-29 DIAGNOSIS — I10 Essential (primary) hypertension: Secondary | ICD-10-CM | POA: Diagnosis not present

## 2023-11-29 DIAGNOSIS — J189 Pneumonia, unspecified organism: Secondary | ICD-10-CM | POA: Insufficient documentation

## 2023-11-29 DIAGNOSIS — E1169 Type 2 diabetes mellitus with other specified complication: Secondary | ICD-10-CM | POA: Diagnosis not present

## 2023-11-29 DIAGNOSIS — R9431 Abnormal electrocardiogram [ECG] [EKG]: Secondary | ICD-10-CM | POA: Insufficient documentation

## 2023-11-29 MED ORDER — AMLODIPINE BESYLATE 5 MG PO TABS: 5.0000 mg | ORAL_TABLET | Freq: Every day | ORAL | 2 refills | Status: DC

## 2023-11-29 NOTE — Progress Notes (Signed)
 I,[name], CMA,acting as a Neurosurgeon for [name], MD.,have documented all relevant documentation on the behalf of [name], MD,as directed by  [name], MD while in the presence of [name], MD.  Subjective:  Patient ID: Ashley Harrison , female    DOB: Jan 22, 1971 , 53 y.o.   MRN: [id number]  Chief Complaint  Patient presents with   Hypertension    HPI Discussed the use of AI scribe software for clinical note transcription with the patient, who gave verbal consent to proceed.  History of Present Illness   History of Present Illness   Ashley Harrison is a 53 year old female with hypertension who presents for a blood pressure check.  She notes elevated blood pressure readings and has not been adhering to her medication regimen. She is currently taking valsartan in the morning but has not been taking amlodipine at night as prescribed. Valsartan causes frequent urination, which disrupts her work schedule. She is on a low dose of amlodipine and has been instructed to take it with dinner.  She is reluctant to have her A1c tested today due to a dislike for blood draws. It has been four months since her last A1c test, which is overdue.  Her work involves being out in the field, making frequent bathroom breaks inconvenient.        Discussed the use of AI scribe software for clinical note transcription with the patient, who gave verbal consent to proceed.  History of Present Illness Ashley Harrison is a 53 year old female with hypertension who presents for a blood pressure check.  She notes elevated blood pressure readings and admits she has not been adhering to her medication regimen. She is currently taking valsartan in the morning but has not been taking amlodipine at night as prescribed. Valsartan causes frequent urination, which disrupts her work schedule. She is on a low dose of amlodipine and has been instructed to take it with dinner.  She is  reluctant to have her A1c tested today due to a dislike for blood draws. It has been four months since her last A1c test, which is overdue.Her work involves being out in the field, making frequent bathroom breaks inconvenient.   Hypertension This is a chronic problem. The current episode started more than 1 year ago. The problem has been gradually worsening since onset. The problem is uncontrolled. Pertinent negatives include no anxiety, blurred vision, headaches or peripheral edema. There are no associated agents to hypertension. Risk factors for coronary artery disease include obesity and sedentary lifestyle. Past treatments include angiotensin blockers and diuretics. There are no compliance problems.  There is no history of angina or kidney disease.        Past Medical History:  Diagnosis Date   Hypertension    Iron deficiency anemia due to chronic blood loss 10/31/2017   Iron deficiency anemia, unspecified    Menometrorrhagia 10/31/2017   Obesity      Family History  Problem Relation Age of Onset   Diabetes Mother    Hyperlipidemia Father    Hypertension Father      Current Outpatient Medications:    amLODipine (NORVASC) 5 MG tablet, Take 1 tablet (5 mg total) by mouth daily., Disp: 90 tablet, Rfl: 2   Blood Pressure Monitor DEVI, Use to check blood pressure daily. Please dispense XL blood pressure cuff if covered by insurance. I10.0., Disp: 1 each, Rfl: 0   Multiple Vitamins-Minerals (MULTIVITAMIN WITH MINERALS)  tablet, Take 2 tablets by mouth daily., Disp: , Rfl:    RYBELSUS 3 MG TABS, Take 1 tablet by mouth once daily, Disp: 30 tablet, Rfl: 0   valsartan-hydrochlorothiazide (DIOVAN-HCT) 160-12.5 MG tablet, Take 1 tablet by mouth once daily, Disp: 90 tablet, Rfl: 0   atorvastatin (LIPITOR) 20 MG tablet, Take 1 tablet (20 mg total) by mouth daily., Disp: 30 tablet, Rfl: 11   Allergies  Allergen Reactions   Shellfish Allergy Hives     Review of Systems  Constitutional:  Negative.   Eyes:  Negative for blurred vision.  Respiratory:  Positive for cough.        States she was recently treated for PNA at Urgent care. She is feeling better, recently had f/u.   Cardiovascular: Negative.   Gastrointestinal: Negative.   Genitourinary:  Positive for frequency.  Neurological: Negative.  Negative for headaches.  Psychiatric/Behavioral: Negative.       Today's Vitals   11/29/23 0923 11/29/23 0945  BP: (!) 170/110 (!) 170/112  Pulse: 91   Temp: 97.9 F (36.6 C)   SpO2: 98%   Weight: (!) 334 lb 6.4 oz (151.7 kg)   Height: 5\' 3"  (1.6 m)    Body mass index is 59.24 kg/m.  Wt Readings from Last 3 Encounters:  11/29/23 (!) 334 lb 6.4 oz (151.7 kg)  10/02/23 (!) 336 lb 9.6 oz (152.7 kg)  08/15/23 (!) 323 lb (146.5 kg)    BP Readings from Last 3 Encounters:  11/29/23 (!) 170/112  10/02/23 (!) 152/110  08/15/23 132/80    Objective:  Physical Exam Vitals and nursing note reviewed.  Constitutional:      Appearance: Normal appearance. She is obese.  HENT:     Head: Normocephalic and atraumatic.  Eyes:     Extraocular Movements: Extraocular movements intact.  Cardiovascular:     Rate and Rhythm: Normal rate and regular rhythm.     Heart sounds: Normal heart sounds.  Pulmonary:     Effort: Pulmonary effort is normal.     Breath sounds: Normal breath sounds.  Musculoskeletal:     Cervical back: Normal range of motion.  Skin:    General: Skin is warm.  Neurological:     General: No focal deficit present.     Mental Status: She is alert.  Psychiatric:        Mood and Affect: Mood normal.        Behavior: Behavior normal.        Assessment And Plan:  HTN (hypertension), malignant Assessment & Plan: Elevated blood pressure with stroke risk. Non-adherence to valsartan and amlodipine due to side effects and inconvenience. Current amlodipine dose is low. - Increase amlodipine to 5 mg daily, taking two 2.5 mg tablets at dinner until current supply is  exhausted. - Send new prescription for amlodipine 5 mg. - Emphasized medication adherence and strategies to manage frequent urination due to diuretic. 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 Will check 2D echo to evaluate heart function. She is agreeable to treatment plan.   Orders: -     ECHOCARDIOGRAM COMPLETE; Future  Pneumonia due to infectious organism, unspecified laterality, unspecified part of lung Assessment & Plan: UC notes reviewed in Care Everywhere in detail  during her visit. All questions were answered to her satisfaction.    Type 2 diabetes mellitus with morbid obesity (HCC) Assessment & Plan: Chronic, due for A1c test, last done four months ago. She agreed to have it done during a nurse visit. - Order A1c test during nurse visit in two weeks.  - For now, she will continue with Rybelsus 3mg  daily. Ideally, we should increase the dose as tolerated to optimize the weight loss benefits. Will consider at her next visit. She will f/u in three to four months for re-evaluation.  -Also encouraged to incorporate more exercise into her daily routine.    Orders: -     CMP14+EGFR; Future -     Hemoglobin A1c; Future  Other orders -     amLODIPine Besylate; Take 1 tablet (5 mg total) by mouth daily.  Dispense: 90 tablet; Refill: 2    Return in about 2 weeks (around 12/13/2023), or bp check-NV, LAB VISIT TOO-orders are in.  Patient was given opportunity to ask questions. Patient verbalized understanding of the plan and was able to repeat key elements of the plan. All questions were answered to their satisfaction.    I, [name], MD, have reviewed all documentation for this visit. The documentation on 11/29/23 for the  exam, diagnosis, procedures, and orders are all accurate and complete.   IF YOU HAVE BEEN REFERRED TO A SPECIALIST, IT MAY TAKE 1-2 WEEKS TO SCHEDULE/PROCESS THE REFERRAL. IF YOU HAVE NOT HEARD FROM US/SPECIALIST IN TWO WEEKS, PLEASE GIVE [place] A CALL AT [phone].   THE PATIENT IS ENCOURAGED TO PRACTICE SOCIAL DISTANCING DUE TO THE COVID-19 PANDEMIC.

## 2023-11-29 NOTE — Assessment & Plan Note (Signed)
 UC notes reviewed in Care Everywhere in detail during her visit. All questions were answered to her satisfaction.

## 2023-11-29 NOTE — Patient Instructions (Addendum)
 Hypertension, Adult Hypertension is another name for high blood pressure. High blood pressure forces your heart to work harder to pump blood. This can cause problems over time. There are two numbers in a blood pressure reading. There is a top number (systolic) over a bottom number (diastolic). It is best to have a blood pressure that is below 120/80. What are the causes? The cause of this condition is not known. Some other conditions can lead to high blood pressure. What increases the risk? Some lifestyle factors can make you more likely to develop high blood pressure: Smoking. Not getting enough exercise or physical activity. Being overweight. Having too much fat, sugar, calories, or salt (sodium) in your diet. Drinking too much alcohol. Other risk factors include: Having any of these conditions: Heart disease. Diabetes. High cholesterol. Kidney disease. Obstructive sleep apnea. Having a family history of high blood pressure and high cholesterol. Age. The risk increases with age. Stress. What are the signs or symptoms? High blood pressure may not cause symptoms. Very high blood pressure (hypertensive crisis) may cause: Headache. Fast or uneven heartbeats (palpitations). Shortness of breath. Nosebleed. Vomiting or feeling like you may vomit (nauseous). Changes in how you see. Very bad chest pain. Feeling dizzy. Seizures. How is this treated? This condition is treated by making healthy lifestyle changes, such as: Eating healthy foods. Exercising more. Drinking less alcohol. Your doctor may prescribe medicine if lifestyle changes do not help enough and if: Your top number is above 130. Your bottom number is above 80. Your personal target blood pressure may vary. Follow these instructions at home: Eating and drinking  If told, follow the DASH eating plan. To follow this plan: Fill one half of your plate at each meal with fruits and vegetables. Fill one fourth of your plate  at each meal with whole grains. Whole grains include whole-wheat pasta, brown rice, and whole-grain bread. Eat or drink low-fat dairy products, such as skim milk or low-fat yogurt. Fill one fourth of your plate at each meal with low-fat (lean) proteins. Low-fat proteins include fish, chicken without skin, eggs, beans, and tofu. Avoid fatty meat, cured and processed meat, or chicken with skin. Avoid pre-made or processed food. Limit the amount of salt in your diet to less than 1,500 mg each day. Do not drink alcohol if: Your doctor tells you not to drink. You are pregnant, may be pregnant, or are planning to become pregnant. If you drink alcohol: Limit how much you have to: 0-1 drink a day for women. 0-2 drinks a day for men. Know how much alcohol is in your drink. In the [place] one drink equals one 12 oz bottle of beer (355 mL), one 5 oz glass of wine (148 mL), or one 1 oz glass of hard liquor (44 mL). Lifestyle  Work with your doctor to stay at a healthy weight or to lose weight. Ask your doctor what the best weight is for you. Get at least 30 minutes of exercise that causes your heart to beat faster (aerobic exercise) most days of the week. This may include walking, swimming, or biking. Get at least 30 minutes of exercise that strengthens your muscles (resistance exercise) at least 3 days a week. This may include lifting weights or doing Pilates. Do not smoke or use any products that contain nicotine or tobacco. If you need help quitting, ask your doctor. Check your blood pressure at home as told by your doctor. Keep all follow-up visits. Medicines Take over-the-counter and prescription medicines  only as told by your doctor. Follow directions carefully. Do not skip doses of blood pressure medicine. The medicine does not work as well if you skip doses. Skipping doses also puts you at risk for problems. Ask your doctor about side effects or reactions to medicines that you should watch  for. Contact a doctor if: You think you are having a reaction to the medicine you are taking. You have headaches that keep coming back. You feel dizzy. You have swelling in your ankles. You have trouble with your vision. Get help right away if: You get a very bad headache. You start to feel mixed up (confused). You feel weak or numb. You feel faint. You have very bad pain in your: Chest. Belly (abdomen). You vomit more than once. You have trouble breathing. These symptoms may be an emergency. Get help right away. Call 911. Do not wait to see if the symptoms will go away. Do not drive yourself to the hospital. Summary Hypertension is another name for high blood pressure. High blood pressure forces your heart to work harder to pump blood. For most people, a normal blood pressure is less than 120/80. Making healthy choices can help lower blood pressure. If your blood pressure does not get lower with healthy choices, you may need to take medicine. This information is not intended to replace advice given to you by your health care provider. Make sure you discuss any questions you have with your health care provider. Document Revised: 06/03/2021 Document Reviewed: 06/03/2021 Elsevier Patient Education  2024 [employer].

## 2023-11-29 NOTE — Assessment & Plan Note (Addendum)
 Chronic, due for A1c test, last done four months ago. She agreed to have it done during a nurse visit. - Order A1c test during nurse visit in two weeks.  - For now, she will continue with Rybelsus 3mg  daily. Ideally, we should increase the dose as tolerated to optimize the weight loss benefits. Will consider at her next visit. She will f/u in three to four months for re-evaluation.  -Also encouraged to incorporate more exercise into her daily routine.

## 2023-11-29 NOTE — Assessment & Plan Note (Addendum)
 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 Will check 2D echo to evaluate heart function. She is agreeable to treatment plan.

## 2023-11-29 NOTE — Assessment & Plan Note (Addendum)
 Elevated blood pressure with stroke risk. Non-adherence to valsartan and amlodipine due to side effects and inconvenience. Current amlodipine dose is low. - Increase amlodipine to 5 mg daily, taking two 2.5 mg tablets at dinner until current supply is exhausted. - Send new prescription for amlodipine 5 mg. - Emphasized medication adherence and strategies to manage frequent urination due to diuretic. -She will also continue with valsartan/hydrochlorothiazide in the mornings

## 2023-11-30 ENCOUNTER — Other Ambulatory Visit: Payer: Self-pay | Admitting: Internal Medicine

## 2023-11-30 DIAGNOSIS — E786 Lipoprotein deficiency: Secondary | ICD-10-CM

## 2023-12-12 ENCOUNTER — Other Ambulatory Visit: Payer: Self-pay

## 2023-12-12 ENCOUNTER — Other Ambulatory Visit

## 2023-12-12 ENCOUNTER — Ambulatory Visit

## 2023-12-12 VITALS — BP 140/88 | HR 85 | Temp 98.5°F | Ht 63.0 in | Wt 334.0 lb

## 2023-12-12 DIAGNOSIS — E1169 Type 2 diabetes mellitus with other specified complication: Secondary | ICD-10-CM | POA: Diagnosis not present

## 2023-12-12 DIAGNOSIS — I10 Essential (primary) hypertension: Secondary | ICD-10-CM

## 2023-12-12 MED ORDER — VALSARTAN-HYDROCHLOROTHIAZIDE 160-25 MG PO TABS: 1.0000 | ORAL_TABLET | Freq: Every day | ORAL | 1 refills | Status: DC

## 2023-12-12 NOTE — Progress Notes (Signed)
 Patient presents today for a bp check- patient currently taking amLODipine 5mg  PM and valsartan-hydrochlorothiazide 160-12.5mg  AM. Second readinf 140/88 BP Readings from Last 3 Encounters:  12/12/23 (!) 120/90  11/29/23 (!) 170/112  10/02/23 (!) 152/110   Per provider- send valsartan 160/25 daily #90/1 - 2 week NV

## 2023-12-13 LAB — CMP14+EGFR
ALT: 15 IU/L (ref 0–32)
AST: 14 IU/L (ref 0–40)
Albumin: 4 g/dL (ref 3.8–4.9)
Alkaline Phosphatase: 151 IU/L — ABNORMAL HIGH (ref 44–121)
BUN/Creatinine Ratio: 19 (ref 9–23)
BUN: 12 mg/dL (ref 6–24)
Bilirubin Total: 0.5 mg/dL (ref 0.0–1.2)
CO2: 25 mmol/L (ref 20–29)
Calcium: 10 mg/dL (ref 8.7–10.2)
Chloride: 100 mmol/L (ref 96–106)
Creatinine, Ser: 0.63 mg/dL (ref 0.57–1.00)
Globulin, Total: 2.5 g/dL (ref 1.5–4.5)
Glucose: 109 mg/dL — ABNORMAL HIGH (ref 70–99)
Potassium: 4.1 mmol/L (ref 3.5–5.2)
Sodium: 142 mmol/L (ref 134–144)
Total Protein: 6.5 g/dL (ref 6.0–8.5)
eGFR: 105 mL/min/{1.73_m2} (ref >59)

## 2023-12-13 LAB — HEMOGLOBIN A1C
Est. average glucose Bld gHb Est-mCnc: 146 mg/dL
Hgb A1c MFr Bld: 6.7 % — ABNORMAL HIGH (ref 4.8–5.6)

## 2023-12-16 ENCOUNTER — Encounter: Payer: Self-pay | Admitting: Internal Medicine

## 2023-12-25 ENCOUNTER — Ambulatory Visit: Payer: BC Managed Care – PPO | Admitting: Internal Medicine

## 2023-12-26 ENCOUNTER — Ambulatory Visit: Payer: Self-pay

## 2023-12-26 VITALS — BP 130/90 | HR 81 | Temp 97.8°F | Ht 63.0 in | Wt 334.0 lb

## 2023-12-26 DIAGNOSIS — I10 Essential (primary) hypertension: Secondary | ICD-10-CM

## 2023-12-26 LAB — BMP8+EGFR
BUN/Creatinine Ratio: 13 (ref 9–23)
BUN: 9 mg/dL (ref 6–24)
CO2: 24 mmol/L (ref 20–29)
Calcium: 9.5 mg/dL (ref 8.7–10.2)
Chloride: 100 mmol/L (ref 96–106)
Creatinine, Ser: 0.67 mg/dL (ref 0.57–1.00)
Glucose: 126 mg/dL — ABNORMAL HIGH (ref 70–99)
Potassium: 4.4 mmol/L (ref 3.5–5.2)
Sodium: 139 mmol/L (ref 134–144)
eGFR: 104 mL/min/{1.73_m2} (ref >59)

## 2023-12-26 MED ORDER — AMLODIPINE BESYLATE 10 MG PO TABS
10.0000 mg | ORAL_TABLET | Freq: Every day | ORAL | Status: AC
Start: 2023-12-26 — End: 2024-12-25

## 2023-12-26 NOTE — Progress Notes (Signed)
 Patient presents today for a bpc. Patient reports compliance with her meds. Patient reports she takes the valsartan  hydrochlorothiazide  160-25mg  in the mornings and amlodipine  5mg  in the evenings.  I checked her blood pressure and it was 142/100 P82. I had the patient wait 10 minutes and it was 130/90 P81. After speaking with provider I advised patient to increase her amlodipine  5mg  to 10mg . She stated she just received a new prescription so I advised her to take 2 tablets by mouth until she runs out then we will send in the new prescription. She is to keep her follow up as scheduled. YL,RMA  BP Readings from Last 3 Encounters:  12/12/23 (!) 140/88  11/29/23 (!) 170/112  10/02/23 (!) 152/110

## 2023-12-26 NOTE — Patient Instructions (Signed)
 Hypertension, Adult Hypertension is another name for high blood pressure. High blood pressure forces your heart to work harder to pump blood. This can cause problems over time. There are two numbers in a blood pressure reading. There is a top number (systolic) over a bottom number (diastolic). It is best to have a blood pressure that is below 120/80. What are the causes? The cause of this condition is not known. Some other conditions can lead to high blood pressure. What increases the risk? Some lifestyle factors can make you more likely to develop high blood pressure: Smoking. Not getting enough exercise or physical activity. Being overweight. Having too much fat, sugar, calories, or salt (sodium) in your diet. Drinking too much alcohol. Other risk factors include: Having any of these conditions: Heart disease. Diabetes. High cholesterol. Kidney disease. Obstructive sleep apnea. Having a family history of high blood pressure and high cholesterol. Age. The risk increases with age. Stress. What are the signs or symptoms? High blood pressure may not cause symptoms. Very high blood pressure (hypertensive crisis) may cause: Headache. Fast or uneven heartbeats (palpitations). Shortness of breath. Nosebleed. Vomiting or feeling like you may vomit (nauseous). Changes in how you see. Very bad chest pain. Feeling dizzy. Seizures. How is this treated? This condition is treated by making healthy lifestyle changes, such as: Eating healthy foods. Exercising more. Drinking less alcohol. Your doctor may prescribe medicine if lifestyle changes do not help enough and if: Your top number is above 130. Your bottom number is above 80. Your personal target blood pressure may vary. Follow these instructions at home: Eating and drinking  If told, follow the DASH eating plan. To follow this plan: Fill one half of your plate at each meal with fruits and vegetables. Fill one fourth of your plate  at each meal with whole grains. Whole grains include whole-wheat pasta, brown rice, and whole-grain bread. Eat or drink low-fat dairy products, such as skim milk or low-fat yogurt. Fill one fourth of your plate at each meal with low-fat (lean) proteins. Low-fat proteins include fish, chicken without skin, eggs, beans, and tofu. Avoid fatty meat, cured and processed meat, or chicken with skin. Avoid pre-made or processed food. Limit the amount of salt in your diet to less than 1,500 mg each day. Do not drink alcohol if: Your doctor tells you not to drink. You are pregnant, may be pregnant, or are planning to become pregnant. If you drink alcohol: Limit how much you have to: 0-1 drink a day for women. 0-2 drinks a day for men. Know how much alcohol is in your drink. In the [place] one drink equals one 12 oz bottle of beer (355 mL), one 5 oz glass of wine (148 mL), or one 1 oz glass of hard liquor (44 mL). Lifestyle  Work with your doctor to stay at a healthy weight or to lose weight. Ask your doctor what the best weight is for you. Get at least 30 minutes of exercise that causes your heart to beat faster (aerobic exercise) most days of the week. This may include walking, swimming, or biking. Get at least 30 minutes of exercise that strengthens your muscles (resistance exercise) at least 3 days a week. This may include lifting weights or doing Pilates. Do not smoke or use any products that contain nicotine or tobacco. If you need help quitting, ask your doctor. Check your blood pressure at home as told by your doctor. Keep all follow-up visits. Medicines Take over-the-counter and prescription medicines  only as told by your doctor. Follow directions carefully. Do not skip doses of blood pressure medicine. The medicine does not work as well if you skip doses. Skipping doses also puts you at risk for problems. Ask your doctor about side effects or reactions to medicines that you should watch  for. Contact a doctor if: You think you are having a reaction to the medicine you are taking. You have headaches that keep coming back. You feel dizzy. You have swelling in your ankles. You have trouble with your vision. Get help right away if: You get a very bad headache. You start to feel mixed up (confused). You feel weak or numb. You feel faint. You have very bad pain in your: Chest. Belly (abdomen). You vomit more than once. You have trouble breathing. These symptoms may be an emergency. Get help right away. Call 911. Do not wait to see if the symptoms will go away. Do not drive yourself to the hospital. Summary Hypertension is another name for high blood pressure. High blood pressure forces your heart to work harder to pump blood. For most people, a normal blood pressure is less than 120/80. Making healthy choices can help lower blood pressure. If your blood pressure does not get lower with healthy choices, you may need to take medicine. This information is not intended to replace advice given to you by your health care provider. Make sure you discuss any questions you have with your health care provider. Document Revised: 06/03/2021 Document Reviewed: 06/03/2021 Elsevier Patient Education  2024 [employer].

## 2023-12-28 ENCOUNTER — Encounter: Payer: Self-pay | Admitting: Internal Medicine

## 2023-12-28 ENCOUNTER — Ambulatory Visit (HOSPITAL_BASED_OUTPATIENT_CLINIC_OR_DEPARTMENT_OTHER)

## 2023-12-28 DIAGNOSIS — R9431 Abnormal electrocardiogram [ECG] [EKG]: Secondary | ICD-10-CM

## 2023-12-28 DIAGNOSIS — I10 Essential (primary) hypertension: Secondary | ICD-10-CM | POA: Diagnosis not present

## 2023-12-28 LAB — ECHOCARDIOGRAM COMPLETE
AR max vel: 1.7 cm2
AV Area VTI: 1.82 cm2
AV Area mean vel: 1.69 cm2
AV Mean grad: 4 mmHg
AV Peak grad: 7.7 mmHg
Ao pk vel: 1.39 m/s
Area-P 1/2: 4.24 cm2
S' Lateral: 3.36 cm

## 2023-12-30 ENCOUNTER — Encounter: Payer: Self-pay | Admitting: Internal Medicine

## 2024-01-30 ENCOUNTER — Encounter (HOSPITAL_BASED_OUTPATIENT_CLINIC_OR_DEPARTMENT_OTHER): Payer: Self-pay

## 2024-02-01 ENCOUNTER — Encounter (HOSPITAL_BASED_OUTPATIENT_CLINIC_OR_DEPARTMENT_OTHER): Payer: Self-pay | Admitting: Family

## 2024-02-01 ENCOUNTER — Ambulatory Visit (INDEPENDENT_AMBULATORY_CARE_PROVIDER_SITE_OTHER): Admitting: Family

## 2024-02-01 VITALS — BP 130/84 | HR 97 | Ht 63.0 in | Wt 328.9 lb

## 2024-02-01 DIAGNOSIS — G4733 Obstructive sleep apnea (adult) (pediatric): Secondary | ICD-10-CM

## 2024-02-01 DIAGNOSIS — I10 Essential (primary) hypertension: Secondary | ICD-10-CM | POA: Diagnosis not present

## 2024-02-01 NOTE — Patient Instructions (Signed)
 Medication Instructions:  Your physician recommends that you continue on your current medications as directed. Please refer to the Current Medication list given to you today.   Labwork: Labs Today   Follow-Up: Please follow up in 3 months in [facility] with Dr. Theodis Fiscal, Neomi Banks, NP or Donivan Furry PharmD    Special Instructions:      Provider Referral Exercise Program (P.R.E.P.)      12 Weeks to Wellness       What is included in the PREP?  --Health Coaching and personalized exercise prescription --Full Membership to participating [facility] for the 12 weeks --Pre- and Post-consultations to assess progress and formulate an exercise plan for       continuation of exercise   What is my investment?  Your cost is $100 (reg. $144). This includes full membership privileges to [facility] in [place] and across the country. If you complete the post-assessment visit, any applicable enrollment costs will be waived if you decide to join the [facility].   Who will benefit from PREP?  People with challenges, including (but not limited to): ---Low back pain ---Arthritis ---Hypertension ---Diabetes ---Obesity ---Joint Replacement ---Neuromuscular Disorders ---Cancer Recovery ---Weight Loss ---Many others (as determined by provider)   Get Started Today! Ask your healthcare provider to fill out a Healthcare Provider Referral for Exercise form. Be sure to turn it in before you leave the office and send/fax/email it directly to the Wellness RN to schedule your Initial Consultation. If you have family or friends that would also benefit, please share this referral with them.   Contacts:  [facility] [phone]    Larrie Po, Wellness RN  [phone]  [email or web address]

## 2024-02-01 NOTE — Progress Notes (Signed)
 [facility] Initial Assessment:    Date:  02/01/2024   ID:  [name], DOB [birth year], MRN [id number]  PCP:  Cleave Curling, MD  Cardiologist:  None  Nephrologist:  Referring MD: Cleave Curling, MD   CC: Hypertension  History of Present Illness:    Ashley Harrison is a 53 y.o. female with a hx of hypertension, hyperlipidemia, DM2, OSAhere to establish care in the [facility].   Echo 12/28/2023 normal LVEF, grade 1 diastolic dysfunction, RV mildly enlarged, no significant valvular abnormalities.  Discussed the use of AI scribe software for clinical note transcription with the patient, who gave verbal consent to proceed.  History of Present Illness Hypertension was diagnosed postpartum with significant fluid retention, requiring hospitalization and IV diuretics. Blood pressure remains elevated, except during her second pregnancy. Current medications include valsartan  hydrochlorothiazide  160/25 mg in the morning and amlodipine  at night. Morning medication causes frequent urination, inconvenient due to her job requiring travel.  Family history includes hypertension in her mother, father, and maternal grandparents. Paternal grandparents' medical history is unknown.  She experiences occasional heart racing or fluttering without chest pain, pressure, tightness, lightheadedness, or dizziness. She has noticed weight loss attributed to dietary changes.  Swelling in ankles and feet is managed with compression socks. 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  Severe sleep apnea diagnosed in 2020, prescribed CPAP, but non-compliant due to discomfort with the nasal pillow mask. Experiences snoring at night.  Knee issues limit exercise; previously engaged in walking and weight training. Uses arthritis cream, knee brace, and herbal patches for relief. Cost concerns prevent further treatment for gel  injection.   Blood pressure not checked routinely at home though plans to purchase larger cuff.  reports tobacco use never. For exercise she has no formal routine. she eats at home and outside of the home and does follow low sodium diet.   Previous antihypertensives: Lisinopril-hydrochlorothiazide  Lasix  Past Medical History:  Diagnosis Date   Hypertension    Iron  deficiency anemia due to chronic blood loss 10/31/2017   Iron  deficiency anemia, unspecified    Menometrorrhagia 10/31/2017   Obesity     Past Surgical History:  Procedure Laterality Date   CESAREAN SECTION  01/2001, 05/2004    x 2   COLONOSCOPY WITH PROPOFOL  N/A 03/29/2022   Procedure: COLONOSCOPY WITH PROPOFOL ;  Surgeon: Felecia Hopper, MD;  Location: [facility];  Service: Gastroenterology;  Laterality: N/A;   HYSTEROSCOPY WITH NOVASURE N/A 02/06/2014   Procedure: HYSTEROSCOPY WITH NOVASURE;  Surgeon: Adrianna Albee, MD;  Location: [facility];  Service: Gynecology;  Laterality: N/A;  1hr OR time   POLYPECTOMY  03/29/2022   Procedure: POLYPECTOMY;  Surgeon: Felecia Hopper, MD;  Location: [facility];  Service: Gastroenterology;;   TUBAL LIGATION  2005   WISDOM TOOTH EXTRACTION  1988    Current Medications: Current Meds  Medication Sig   amLODipine  (NORVASC ) 10 MG tablet Take 1 tablet (10 mg total) by mouth daily.   atorvastatin  (LIPITOR) 20 MG tablet Take 1 tablet by mouth once daily   RYBELSUS  3 MG TABS Take 1 tablet by mouth once daily   valsartan -hydrochlorothiazide  (DIOVAN -HCT) 160-25 MG tablet Take 1 tablet by mouth daily.     Allergies:   Shellfish allergy   Social History   Socioeconomic History   Marital status: Divorced    Spouse name: Not on file   Number of children: Not on  file   Years of education: Not on file   Highest education level: Bachelor's degree (e.g., BA, AB, BS)  Occupational History   Not on file  Tobacco Use   Smoking status: Never   Smokeless tobacco: Never  Vaping Use    Vaping status: Never Used  Substance and Sexual Activity   Alcohol use: No   Drug use: No   Sexual activity: Not Currently    Birth control/protection: Surgical  Other Topics Concern   Not on file  Social History Narrative   Not on file   Social Drivers of Health   Financial Resource Strain: Low Risk  (08/15/2023)   Overall Financial Resource Strain (CARDIA)    Difficulty of Paying Living Expenses: Not hard at all  Food Insecurity: No Food Insecurity (08/15/2023)   Hunger Vital Sign    Worried About Running Out of Food in the Last Year: Never true    Ran Out of Food in the Last Year: Never true  Transportation Needs: No Transportation Needs (08/15/2023)   PRAPARE - Administrator, Civil Service (Medical): No    Lack of Transportation (Non-Medical): No  Physical Activity: Unknown (08/15/2023)   Exercise Vital Sign    Days of Exercise per Week: 0 days    Minutes of Exercise per Session: Not on file  Stress: No Stress Concern Present (08/15/2023)   [employer] of Occupational Health - Occupational Stress Questionnaire    Feeling of Stress : Not at all  Social Connections: Unknown (08/15/2023)   Social Connection and Isolation Panel [NHANES]    Frequency of Communication with Friends and Family: More than three times a week    Frequency of Social Gatherings with Friends and Family: Once a week    Attends Religious Services: More than 4 times per year    Active Member of [employer] or Organizations: Patient declined    Attends Engineer, structural: Not on file    Marital Status: Divorced     Family History: The patient's family history includes Diabetes in her mother; Hyperlipidemia in her father; Hypertension in her father, maternal grandfather, maternal grandmother, and mother.  ROS:   Please see the history of present illness.     All other systems reviewed and are negative.  EKGs/Labs/Other Studies Reviewed:         Recent Labs: 04/24/2023:  Hemoglobin 12.3; Platelets 374 12/12/2023: ALT 15 12/26/2023: BUN 9; Creatinine, Ser 0.67; Potassium 4.4; Sodium 139   Recent Lipid Panel    Component Value Date/Time   CHOL 197 08/15/2023 0910   TRIG 69 08/15/2023 0910   HDL 70 08/15/2023 0910   CHOLHDL 2.8 08/15/2023 0910   LDLCALC 114 (H) 08/15/2023 0910    Physical Exam:   VS:  BP 130/84 (BP Location: Right Arm, Patient Position: Sitting)   Pulse 97   Ht 5\' 3"  (1.6 m)   Wt (!) 328 lb 14.4 oz (149.2 kg)   SpO2 98%   BMI 58.26 kg/m  , BMI Body mass index is 58.26 kg/m.  Vitals:   02/01/24 0759 02/01/24 0806  BP: 128/78 130/84  Pulse: 97   Height: 5\' 3"  (1.6 m)   Weight: (!) 328 lb 14.4 oz (149.2 kg)   SpO2: 98%   BMI (Calculated): 58.28     GENERAL:  Well appearing, overweight HEENT: Pupils equal round and reactive, fundi not visualized, oral mucosa unremarkable NECK:  No jugular venous distention, waveform within normal limits, carotid upstroke brisk and symmetric,  no bruits, no thyromegaly LYMPHATICS:  No cervical adenopathy LUNGS:  Clear to auscultation bilaterally HEART:  RRR.  PMI not displaced or sustained,S1 and S2 within normal limits, no S3, no S4, no clicks, no rubs, no murmurs ABD:  Flat, positive bowel sounds normal in frequency in pitch, no bruits, no rebound, no guarding, no midline pulsatile mass, no hepatomegaly, no splenomegaly EXT:  2 plus pulses throughout, no edema, no cyanosis no clubbing SKIN:  No rashes no nodules NEURO:  Cranial nerves II through XII grossly intact, motor grossly intact throughout PSYCH:  Cognitively intact, oriented to person place and time   ASSESSMENT/PLAN:     Assessment & Plan Hypertension Hypertension post-pregnancy well-controlled with current medication regimen. Discussed secondary causes and lifestyle modifications. Addressed diuretic side effects and medication efficacy. - Order blood work for secondary causes: thyroid  function, renin-aldosterone, catecholamines,  metanephrines. - Continue valsartan  hydrochlorothiazide  160-25mg  daily and amlodipine  10mg  daily - Discuss separating valsartan  and hydrochlorothiazide  to manage diuretic side effects. Prefers to keep as-is. Hopeful that with lifestyle changes will be able to reduce diuretic dose. - Encourage lifestyle modifications: weight loss, exercise. -Refer to PREP exercise program  Severe sleep apnea Severe sleep apnea diagnosed with Dr. Albertina Hugger 2020 with CPAP discomfort. Discussed alternative treatments and emphasized importance for blood pressure control. - Encouraged to consider alternate CPAP mask trial  Knee pain Chronic knee pain with limited treatment options due to insurance. Discussed exercise options to minimize strain. - Provide information on PREP and [facility] [employer] exercise programs. - Encourage use of knee brace and Voltaren  gel.    Screening for Secondary Hypertension:     02/01/2024    9:40 AM  Causes  Drugs/Herbals N/A  Sleep Apnea Screened     - Comments untreated, did not tolerate CPAP  Thyroid  Disease Screened  Hyperaldosteronism Screened  Pheochromocytoma Screened  Coarctation of the Aorta N/A     - Comments BP symmetrical  Compliance Screened     - Comments occasionally skips valsartan -hctz due to urinary frequency    Relevant Labs/Studies:    Latest Ref Rng & Units 12/26/2023    9:09 AM 12/12/2023   12:00 AM 08/15/2023    9:10 AM  Basic Labs  Sodium 134 - 144 mmol/L 139  142  141   Potassium 3.5 - 5.2 mmol/L 4.4  4.1  4.2   Creatinine 0.57 - 1.00 mg/dL 4.09  8.11  9.14        Latest Ref Rng & Units 09/09/2021    1:05 PM 07/09/2019    5:46 PM  Thyroid    TSH 0.450 - 4.500 uIU/mL 0.761  0.745                  she is interested in enrolling in the PREP exercise and nutrition program through the [facility].     Disposition:    FU with MD/APP/PharmD in 3 months    Medication Adjustments/Labs and Tests Ordered: Current medicines are reviewed at length with the  patient today.  Concerns regarding medicines are outlined above.  Orders Placed This Encounter  Procedures   TSH   Metanephrines, plasma   Catecholamines, fractionated, plasma   Aldosterone + renin activity w/ ratio   Amb Referral To Provider Referral Exercise Program (P.R.E.P)   No orders of the defined types were placed in this encounter.    Signed, Clearnce Curia, NP  02/01/2024 9:41 AM    [facility]

## 2024-02-05 ENCOUNTER — Telehealth: Payer: Self-pay

## 2024-02-05 NOTE — Telephone Encounter (Signed)
 Called [facility] program; explained PREP, she would like to attend but works 8-5 M-F in [place] so cannot attend day time classes.

## 2024-02-12 ENCOUNTER — Ambulatory Visit (HOSPITAL_BASED_OUTPATIENT_CLINIC_OR_DEPARTMENT_OTHER): Payer: Self-pay | Admitting: Family

## 2024-02-12 LAB — TSH: TSH: 0.982 u[IU]/mL (ref 0.450–4.500)

## 2024-02-12 LAB — CATECHOLAMINES, FRACTIONATED, PLASMA
Dopamine: <10 pg/mL (ref 0.0–36.7)
Epinephrine: 22.2 pg/mL (ref 0.0–55.4)
Norepinephrine: 533 pg/mL — ABNORMAL HIGH (ref 115–524)

## 2024-02-12 LAB — ALDOSTERONE + RENIN ACTIVITY W/ RATIO
Aldos/Renin Ratio: 7.3 (ref 0.0–30.0)
Aldosterone: 10.8 ng/dL (ref 0.0–30.0)
Renin Activity, Plasma: 1.475 ng/mL/h (ref 0.167–5.380)

## 2024-02-12 LAB — METANEPHRINES, PLASMA
Metanephrine, Free: 28.9 pg/mL (ref 0.0–88.0)
Normetanephrine, Free: 141.3 pg/mL (ref 0.0–244.0)

## 2024-02-13 ENCOUNTER — Ambulatory Visit: Admitting: Internal Medicine

## 2024-02-21 NOTE — Progress Notes (Signed)
 I,[name], CMA,acting as a Neurosurgeon for [name], MD.,have documented all relevant documentation on the behalf of [name], MD,as directed by  [name], MD while in the presence of [name], MD.  Subjective:  Patient ID: Ashley Harrison , female    DOB: [birth year] , 53 y.o.   MRN: [id number]  Chief Complaint  Patient presents with   Hypertension    Patient presents today for bp follow up. She reports compliance with medications. Denies headache, chest pain & sob. At last visit, amlodipine  increased to 10mg  from 5mg . She admits missing some doses due to excessive bathroom stops. She will call to schedule mammogram.      HPI Discussed the use of AI scribe software for clinical note transcription with the patient, who gave verbal consent to proceed.  History of Present Illness Ashley Harrison is a 53 year old female with hypertension and sleep apnea who presents for follow-up of her blood pressure management.  She is currently taking valsartan  in the morning and amlodipine  at dinner for hypertension. She does not monitor her blood pressure at home but mentions a recent specialist visit where her blood pressure was good. She feels nervous during this visit. Her blood pressure was recorded at 150/108 mmHg during the visit.  She is not using her CPAP machine, which was previously prescribed for severe sleep apnea, due to discomfort with the mask. She is unsure if the machine still works. Non-compliance with CPAP therapy may contribute to elevated blood pressure and fatigue.  She has not taken atorvastatin  since April, although she denies any issues with the medication. She is currently taking Rybelsus  3 mg, but her insurance does not cover it. She works at [employer] and mentions that she might be able to obtain the medication through [facility], where she also sees a Control and instrumentation engineer who is a Teacher, early years/pre.  She works from home two days a week,  which may facilitate the completion of a 24-hour urine test recommended by a specialist to evaluate elevated catecholamine levels. She mentions that her veins are small and roll, making blood draws challenging.   Hypertension This is a chronic problem. The current episode started more than 1 year ago. The problem has been gradually worsening since onset. The problem is uncontrolled. Pertinent negatives include no anxiety, blurred vision, headaches or peripheral edema. There are no associated agents to hypertension. Risk factors for coronary artery disease include obesity and sedentary lifestyle. Past treatments include angiotensin blockers and diuretics. There are no compliance problems.  There is no history of angina or kidney disease.     Past Medical History:  Diagnosis Date   Hypertension    Iron  deficiency anemia due to chronic blood loss 10/31/2017   Iron  deficiency anemia, unspecified    Menometrorrhagia 10/31/2017   Obesity      Family History  Problem Relation Age of Onset   Hypertension Mother    Diabetes Mother    Hyperlipidemia Father    Hypertension Father    Hypertension Maternal Grandmother    Hypertension Maternal Grandfather      Current Outpatient Medications:    amLODipine  (NORVASC ) 10 MG tablet, Take 1 tablet (10 mg total) by mouth daily., Disp: , Rfl:    Blood Pressure Monitor DEVI, Use to check blood pressure daily. Please dispense XL blood pressure cuff if covered by insurance. I10.0., Disp: 1 each, Rfl: 0   Multiple Vitamins-Minerals (MULTIVITAMIN WITH MINERALS) tablet, Take  2 tablets by mouth daily., Disp: , Rfl:    RYBELSUS  3 MG TABS, Take 1 tablet by mouth once daily, Disp: 30 tablet, Rfl: 0   valsartan -hydrochlorothiazide  (DIOVAN -HCT) 160-25 MG tablet, Take 1 tablet by mouth daily., Disp: 90 tablet, Rfl: 1   atorvastatin  (LIPITOR) 20 MG tablet, Take 1 tablet (20 mg total) by mouth daily., Disp: 90 tablet, Rfl: 2   Allergies  Allergen Reactions   Shellfish  Allergy Hives     Review of Systems  Constitutional: Negative.   Eyes:  Negative for blurred vision.  Respiratory: Negative.    Cardiovascular: Negative.   Gastrointestinal: Negative.   Neurological: Negative.  Negative for headaches.  Psychiatric/Behavioral: Negative.       Today's Vitals   02/22/24 0919 02/22/24 0938  BP: (!) 150/100 (!) 150/108  Pulse: 80   Temp: 97.9 F (36.6 C)   SpO2: 98%   Weight: (!) 336 lb 6.4 oz (152.6 kg)   Height: 5' 3 (1.6 m)    Body mass index is 59.59 kg/m.  Wt Readings from Last 3 Encounters:  02/22/24 (!) 336 lb 6.4 oz (152.6 kg)  02/01/24 (!) 328 lb 14.4 oz (149.2 kg)  12/26/23 (!) 334 lb (151.5 kg)     Objective:  Physical Exam Vitals and nursing note reviewed.  Constitutional:      Appearance: Normal appearance. She is obese.  HENT:     Head: Normocephalic and atraumatic.  Eyes:     Extraocular Movements: Extraocular movements intact.  Cardiovascular:     Rate and Rhythm: Normal rate and regular rhythm.     Heart sounds: Normal heart sounds.  Pulmonary:     Effort: Pulmonary effort is normal.     Breath sounds: Normal breath sounds.  Musculoskeletal:     Cervical back: Normal range of motion.  Skin:    General: Skin is warm.  Neurological:     General: No focal deficit present.     Mental Status: She is alert.  Psychiatric:        Mood and Affect: Mood normal.        Behavior: Behavior normal.      Assessment And Plan:  Malignant hypertension Assessment & Plan: Chronic, blood pressure elevated at 150/108 mmHg. Non-adherence to CPAP therapy contributing to hypertension. - Encouraged CPAP adherence, contact provider for new masks if needed. - Continue valsartan  and amlodipine . - Order 24-hour urine catecholamines test. - Importance of dietary/medication compliance was discussed with the patient  Orders: -     Catecholamines, fractionated, urine, 24 hour; Future  Pure hypercholesterolemia Assessment &  Plan: Atorvastatin  not filled since April. Emphasized cholesterol management and medication adherence. - Renew atorvastatin  prescription. - Schedule cholesterol check in six weeks.  Orders: -     Atorvastatin  Calcium ; Take 1 tablet (20 mg total) by mouth daily.  Dispense: 90 tablet; Refill: 2  Type 2 diabetes mellitus with morbid obesity (HCC) Assessment & Plan: Taking Rybelsus  3 mg. Insurance does not cover, but may obtain through work pharmacy. - Check diabetes control in six weeks. - Investigate prior authorization for Rybelsus .   OSA (obstructive sleep apnea) Assessment & Plan: Severe sleep apnea with non-adherence to CPAP due to mask discomfort. CPAP crucial for blood pressure control and cardiovascular risk reduction. - Contact CPAP provider for new masks. - Discuss procedural options if CPAP intolerable and BMI < 35.    Return in 6 weeks (on 04/04/2024) for 4 month bpc.  Patient was given opportunity to ask questions.  Patient verbalized understanding of the plan and was able to repeat key elements of the plan. All questions were answered to their satisfaction.                                                                                                                                                                 I, [name], MD, have reviewed all documentation for this visit. The documentation on 02/22/24 for the exam, diagnosis, procedures, and orders are all accurate and complete.   IF YOU HAVE BEEN REFERRED TO A SPECIALIST, IT MAY TAKE 1-2 WEEKS TO SCHEDULE/PROCESS THE REFERRAL. IF YOU HAVE NOT HEARD FROM US /SPECIALIST IN TWO WEEKS, PLEASE GIVE US  A CALL AT [phone].   THE PATIENT IS ENCOURAGED TO PRACTICE SOCIAL DISTANCING DUE TO THE COVID-19 PANDEMIC.

## 2024-02-21 NOTE — Patient Instructions (Signed)
 Hypertension, Adult Hypertension is another name for high blood pressure. High blood pressure forces your heart to work harder to pump blood. This can cause problems over time. There are two numbers in a blood pressure reading. There is a top number (systolic) over a bottom number (diastolic). It is best to have a blood pressure that is below 120/80. What are the causes? The cause of this condition is not known. Some other conditions can lead to high blood pressure. What increases the risk? Some lifestyle factors can make you more likely to develop high blood pressure: Smoking. Not getting enough exercise or physical activity. Being overweight. Having too much fat, sugar, calories, or salt (sodium) in your diet. Drinking too much alcohol. Other risk factors include: Having any of these conditions: Heart disease. Diabetes. High cholesterol. Kidney disease. Obstructive sleep apnea. Having a family history of high blood pressure and high cholesterol. Age. The risk increases with age. Stress. What are the signs or symptoms? High blood pressure may not cause symptoms. Very high blood pressure (hypertensive crisis) may cause: Headache. Fast or uneven heartbeats (palpitations). Shortness of breath. Nosebleed. Vomiting or feeling like you may vomit (nauseous). Changes in how you see. Very bad chest pain. Feeling dizzy. Seizures. How is this treated? This condition is treated by making healthy lifestyle changes, such as: Eating healthy foods. Exercising more. Drinking less alcohol. Your doctor may prescribe medicine if lifestyle changes do not help enough and if: Your top number is above 130. Your bottom number is above 80. Your personal target blood pressure may vary. Follow these instructions at home: Eating and drinking  If told, follow the DASH eating plan. To follow this plan: Fill one half of your plate at each meal with fruits and vegetables. Fill one fourth of your plate  at each meal with whole grains. Whole grains include whole-wheat pasta, brown rice, and whole-grain bread. Eat or drink low-fat dairy products, such as skim milk or low-fat yogurt. Fill one fourth of your plate at each meal with low-fat (lean) proteins. Low-fat proteins include fish, chicken without skin, eggs, beans, and tofu. Avoid fatty meat, cured and processed meat, or chicken with skin. Avoid pre-made or processed food. Limit the amount of salt in your diet to less than 1,500 mg each day. Do not drink alcohol if: Your doctor tells you not to drink. You are pregnant, may be pregnant, or are planning to become pregnant. If you drink alcohol: Limit how much you have to: 0-1 drink a day for women. 0-2 drinks a day for men. Know how much alcohol is in your drink. In the [place] one drink equals one 12 oz bottle of beer (355 mL), one 5 oz glass of wine (148 mL), or one 1 oz glass of hard liquor (44 mL). Lifestyle  Work with your doctor to stay at a healthy weight or to lose weight. Ask your doctor what the best weight is for you. Get at least 30 minutes of exercise that causes your heart to beat faster (aerobic exercise) most days of the week. This may include walking, swimming, or biking. Get at least 30 minutes of exercise that strengthens your muscles (resistance exercise) at least 3 days a week. This may include lifting weights or doing Pilates. Do not smoke or use any products that contain nicotine or tobacco. If you need help quitting, ask your doctor. Check your blood pressure at home as told by your doctor. Keep all follow-up visits. Medicines Take over-the-counter and prescription medicines  only as told by your doctor. Follow directions carefully. Do not skip doses of blood pressure medicine. The medicine does not work as well if you skip doses. Skipping doses also puts you at risk for problems. Ask your doctor about side effects or reactions to medicines that you should watch  for. Contact a doctor if: You think you are having a reaction to the medicine you are taking. You have headaches that keep coming back. You feel dizzy. You have swelling in your ankles. You have trouble with your vision. Get help right away if: You get a very bad headache. You start to feel mixed up (confused). You feel weak or numb. You feel faint. You have very bad pain in your: Chest. Belly (abdomen). You vomit more than once. You have trouble breathing. These symptoms may be an emergency. Get help right away. Call 911. Do not wait to see if the symptoms will go away. Do not drive yourself to the hospital. Summary Hypertension is another name for high blood pressure. High blood pressure forces your heart to work harder to pump blood. For most people, a normal blood pressure is less than 120/80. Making healthy choices can help lower blood pressure. If your blood pressure does not get lower with healthy choices, you may need to take medicine. This information is not intended to replace advice given to you by your health care provider. Make sure you discuss any questions you have with your health care provider. Document Revised: 06/03/2021 Document Reviewed: 06/03/2021 Elsevier Patient Education  2024 [employer].

## 2024-02-22 ENCOUNTER — Ambulatory Visit: Admitting: Internal Medicine

## 2024-02-22 ENCOUNTER — Encounter: Payer: Self-pay | Admitting: Internal Medicine

## 2024-02-22 VITALS — BP 150/108 | HR 80 | Temp 97.9°F | Ht 63.0 in | Wt 336.4 lb

## 2024-02-22 DIAGNOSIS — E1169 Type 2 diabetes mellitus with other specified complication: Secondary | ICD-10-CM | POA: Diagnosis not present

## 2024-02-22 DIAGNOSIS — I10 Essential (primary) hypertension: Secondary | ICD-10-CM

## 2024-02-22 DIAGNOSIS — G4733 Obstructive sleep apnea (adult) (pediatric): Secondary | ICD-10-CM

## 2024-02-22 DIAGNOSIS — E7800 Pure hypercholesterolemia, unspecified: Secondary | ICD-10-CM | POA: Diagnosis not present

## 2024-02-22 DIAGNOSIS — M1712 Unilateral primary osteoarthritis, left knee: Secondary | ICD-10-CM

## 2024-02-22 MED ORDER — ATORVASTATIN CALCIUM 20 MG PO TABS: 20.0000 mg | ORAL_TABLET | Freq: Every day | ORAL | 2 refills | Status: AC

## 2024-02-26 ENCOUNTER — Telehealth: Payer: Self-pay

## 2024-03-01 NOTE — Assessment & Plan Note (Signed)
 Severe sleep apnea with non-adherence to CPAP due to mask discomfort. CPAP crucial for blood pressure control and cardiovascular risk reduction. - Contact CPAP provider for new masks. - Discuss procedural options if CPAP intolerable and BMI < 35.

## 2024-03-01 NOTE — Assessment & Plan Note (Signed)
 Atorvastatin  not filled since April. Emphasized cholesterol management and medication adherence. - Renew atorvastatin  prescription. - Schedule cholesterol check in six weeks.

## 2024-03-01 NOTE — Assessment & Plan Note (Signed)
 Chronic, blood pressure elevated at 150/108 mmHg. Non-adherence to CPAP therapy contributing to hypertension. - Encouraged CPAP adherence, contact provider for new masks if needed. - Continue valsartan  and amlodipine . - Order 24-hour urine catecholamines test. - Importance of dietary/medication compliance was discussed with the patient

## 2024-03-01 NOTE — Assessment & Plan Note (Signed)
 Taking Rybelsus  3 mg. Insurance does not cover, but may obtain through work pharmacy. - Check diabetes control in six weeks. - Investigate prior authorization for Rybelsus .

## 2024-04-04 ENCOUNTER — Ambulatory Visit: Admitting: Internal Medicine

## 2024-04-18 ENCOUNTER — Ambulatory Visit: Admitting: Internal Medicine

## 2024-04-18 ENCOUNTER — Encounter: Payer: Self-pay | Admitting: Internal Medicine

## 2024-04-18 VITALS — BP 136/98 | HR 92 | Temp 98.4°F | Ht 63.0 in | Wt 335.0 lb

## 2024-04-18 DIAGNOSIS — I10 Essential (primary) hypertension: Secondary | ICD-10-CM

## 2024-04-18 DIAGNOSIS — E1169 Type 2 diabetes mellitus with other specified complication: Secondary | ICD-10-CM

## 2024-04-18 DIAGNOSIS — E66813 Obesity, class 3: Secondary | ICD-10-CM
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
## 2024-04-18 NOTE — Patient Instructions (Signed)
 Hypertension, Adult Hypertension is another name for high blood pressure. High blood pressure forces your heart to work harder to pump blood. This can cause problems over time. There are two numbers in a blood pressure reading. There is a top number (systolic) over a bottom number (diastolic). It is best to have a blood pressure that is below 120/80. What are the causes? The cause of this condition is not known. Some other conditions can lead to high blood pressure. What increases the risk? Some lifestyle factors can make you more likely to develop high blood pressure: Smoking. Not getting enough exercise or physical activity. Being overweight. Having too much fat, sugar, calories, or salt (sodium) in your diet. Drinking too much alcohol . Other risk factors include: Having any of these conditions: Heart disease. Diabetes. High cholesterol. Kidney disease. Obstructive sleep apnea. Having a family history of high blood pressure and high cholesterol. Age. The risk increases with age. Stress. What are the signs or symptoms? High blood pressure may not cause symptoms. Very high blood pressure (hypertensive crisis) may cause: Headache. Fast or uneven heartbeats (palpitations). Shortness of breath. Nosebleed. Vomiting or feeling like you may vomit (nauseous). Changes in how you see. Very bad chest pain. Feeling dizzy. Seizures. How is this treated? This condition is treated by making healthy lifestyle changes, such as: Eating healthy foods. Exercising more. Drinking less alcohol . Your doctor may prescribe medicine if lifestyle changes do not help enough and if: Your top number is above 130. Your bottom number is above 80. Your personal target blood pressure may vary. Follow these instructions at home: Eating and drinking  If told, follow the DASH eating plan. To follow this plan: Fill one half of your plate at each meal with fruits and vegetables. Fill one fourth of your plate  at each meal with whole grains. Whole grains include whole-wheat pasta, brown rice, and whole-grain bread. Eat or drink low-fat dairy products, such as skim milk or low-fat yogurt. Fill one fourth of your plate at each meal with low-fat (lean) proteins. Low-fat proteins include fish, chicken without skin, eggs, beans, and tofu. Avoid fatty meat, cured and processed meat, or chicken with skin. Avoid pre-made or processed food. Limit the amount of salt in your diet to less than 1,500 mg each day. Do not drink alcohol  if: Your doctor tells you not to drink. You are pregnant, may be pregnant, or are planning to become pregnant. If you drink alcohol : Limit how much you have to: 0-1 drink a day for women. 0-2 drinks a day for men. Know how much alcohol  is in your drink. In the [place] one drink equals one 12 oz bottle of beer (355 mL), one 5 oz glass of wine (148 mL), or one 1 oz glass of hard liquor (44 mL). Lifestyle  Work with your doctor to stay at a healthy weight or to lose weight. Ask your doctor what the best weight is for you. Get at least 30 minutes of exercise that causes your heart to beat faster (aerobic exercise) most days of the week. This may include walking, swimming, or biking. Get at least 30 minutes of exercise that strengthens your muscles (resistance exercise) at least 3 days a week. This may include lifting weights or doing Pilates. Do not smoke or use any products that contain nicotine  or tobacco. If you need help quitting, ask your doctor. Check your blood pressure at home as told by your doctor. Keep all follow-up visits. Medicines Take over-the-counter and prescription medicines  only as told by your doctor. Follow directions carefully. Do not skip doses of blood pressure medicine. The medicine does not work as well if you skip doses. Skipping doses also puts you at risk for problems. Ask your doctor about side effects or reactions to medicines that you should watch  for. Contact a doctor if: You think you are having a reaction to the medicine you are taking. You have headaches that keep coming back. You feel dizzy. You have swelling in your ankles. You have trouble with your vision. Get help right away if: You get a very bad headache. You start to feel mixed up (confused). You feel weak or numb. You feel faint. You have very bad pain in your: Chest. Belly (abdomen). You vomit more than once. You have trouble breathing. These symptoms may be an emergency. Get help right away. Call 911. Do not wait to see if the symptoms will go away. Do not drive yourself to the hospital. Summary Hypertension is another name for high blood pressure. High blood pressure forces your heart to work harder to pump blood. For most people, a normal blood pressure is less than 120/80. Making healthy choices can help lower blood pressure. If your blood pressure does not get lower with healthy choices, you may need to take medicine. This information is not intended to replace advice given to you by your health care provider. Make sure you discuss any questions you have with your health care provider. Document Revised: 06/03/2021 Document Reviewed: 06/03/2021 Elsevier Patient Education  2024 [employer].

## 2024-04-20 NOTE — Assessment & Plan Note (Signed)
 Discussed insurance coverage for Rebelsys and potential health coach involvement. Current supply of Rebelsys is expired but usable for a short period. - Coordinate with her health coach Rosina regarding Rebelsys access.

## 2024-04-20 NOTE — Assessment & Plan Note (Signed)
 Hypertension management complicated by polyuria and edema. Current regimen includes valsartan  HCT and amlodipine . Discussed amlodipine  side effects and alternative dosing to manage symptoms while maintaining blood pressure control. - Prescribe valsartan  HCT 160/12.5 mg to reduce diuretic component. - Instruct to take half a pill of valsartan  HCT at breakfast and half at lunch or dinner. - Continue amlodipine , consider taking in the morning. - Reassess blood pressure control and symptoms at next visit.

## 2024-05-02 ENCOUNTER — Ambulatory Visit (HOSPITAL_BASED_OUTPATIENT_CLINIC_OR_DEPARTMENT_OTHER): Admitting: Family

## 2024-05-02 ENCOUNTER — Encounter (HOSPITAL_BASED_OUTPATIENT_CLINIC_OR_DEPARTMENT_OTHER): Payer: Self-pay

## 2024-05-02 ENCOUNTER — Encounter (HOSPITAL_BASED_OUTPATIENT_CLINIC_OR_DEPARTMENT_OTHER): Payer: Self-pay | Admitting: Family

## 2024-05-02 VITALS — BP 127/66 | HR 80 | Ht 63.0 in | Wt 336.4 lb

## 2024-05-02 DIAGNOSIS — I10 Essential (primary) hypertension: Secondary | ICD-10-CM

## 2024-05-02 DIAGNOSIS — Z6841 Body Mass Index (BMI) 40.0 and over, adult: Secondary | ICD-10-CM

## 2024-05-02 DIAGNOSIS — G4733 Obstructive sleep apnea (adult) (pediatric): Secondary | ICD-10-CM | POA: Diagnosis not present

## 2024-05-02 MED ORDER — VALSARTAN-HYDROCHLOROTHIAZIDE 160-12.5 MG PO TABS: 1.0000 | ORAL_TABLET | Freq: Every day | ORAL | 1 refills | Status: AC

## 2024-05-02 NOTE — Patient Instructions (Addendum)
 Medication Instructions:   DECREASE YOUR VALSARTAN -hydrochlorothiazide  (DIOVAN -HCT) TO 160-12.5 MG TABLET--TAKE ONE TABLET BY MOUTH DAILY     Follow-Up: Please follow up in 2-3 months in [facility] with Reche Finder, NP    Special Instructions:      Orthopedics: Dr. Genelle or his team [place], [place] or [place], [place])

## 2024-05-02 NOTE — Progress Notes (Signed)
 [facility] Assessment:    Date:  05/02/2024   ID:  Ashley Harrison, DOB [birth year], MRN [id number]  PCP:  Jarold Medici, MD  Cardiologist:  None  Nephrologist:  Referring MD: Jarold Medici, MD   CC: Hypertension  History of Present Illness:    Ashley Harrison is a 53 y.o. female with a hx of hypertension, hyperlipidemia, DM2, OSAhere to follow up  care in the [facility].   Echo 12/28/2023 normal LVEF, grade 1 diastolic dysfunction, RV mildly enlarged, no significant valvular abnormalities.  Discussed the use of AI scribe software for clinical note transcription with the patient, who gave verbal consent to proceed.  History of Present Illness Established with [facility] 02/01/24. Hypertension was diagnosed postpartum with significant fluid retention, requiring hospitalization and IV diuretics. Blood pressure remains elevated, except during her second pregnancy.She noted frequent urination, inconvenient due to her job requiring travel.  Family history includes hypertension in her mother, father, and maternal grandparents. Paternal grandparents' medical history is unknown.  Severe sleep apnea diagnosed in 2020, prescribed CPAP, but non-compliant due to discomfort with the nasal pillow mask. Knee issues limited exercise.  She was encouraged to trial different CPAP mask. Referred to PREP exercise program but unable to participate due to daytime classes conflicting with work schedule. TSH, renin-aldo,  metanephrines unremarkable. Plasma catecholamines mildly elevated, recommended for 24h urine catecholamines.  BP reasonably controlled at 130/84 with plan for lifestyle changes to hopefully be able to reduce diuretic dose.  At visit 04/18/24 with PCP her Valsartan  was adjusted to 160-25mg  taking half tablet twice daily to see if improved urinary frequency.  Presents today for follow up. Despite adjusting timing of  Valsartan -hydrochlorothiazide  to 160-12.5 mg her urinary frequency remains the same. She tried splitting the dose but found it cumbersome. She takes Amlodipine  at night time. The urinary frequency lasts until about 4:30pm after taking the medication at 8-9 am. She feels more urgency to urination after taking. If she takes it consistently notes that the symptoms do improve. She does feel she needs a diuretic component due to LE edema.   Previous antihypertensives: Lisinopril-hydrochlorothiazide  Lasix  Past Medical History:  Diagnosis Date   Hypertension    Iron  deficiency anemia due to chronic blood loss 10/31/2017   Iron  deficiency anemia, unspecified    Menometrorrhagia 10/31/2017   Obesity     Past Surgical History:  Procedure Laterality Date   CESAREAN SECTION  01/2001, 05/2004    x 2   COLONOSCOPY WITH PROPOFOL  N/A 03/29/2022   Procedure: COLONOSCOPY WITH PROPOFOL ;  Surgeon: Elicia Claw, MD;  Location: [facility];  Service: Gastroenterology;  Laterality: N/A;   HYSTEROSCOPY WITH NOVASURE N/A 02/06/2014   Procedure: HYSTEROSCOPY WITH NOVASURE;  Surgeon: Nathanel LELON Bunker, MD;  Location: [facility];  Service: Gynecology;  Laterality: N/A;  1hr OR time   POLYPECTOMY  03/29/2022   Procedure: POLYPECTOMY;  Surgeon: Elicia Claw, MD;  Location: [facility];  Service: Gastroenterology;;   TUBAL LIGATION  2005   WISDOM TOOTH EXTRACTION  1988    Current Medications: Current Meds  Medication Sig   amLODipine  (NORVASC ) 10 MG tablet Take 1 tablet (10 mg total) by mouth daily.   atorvastatin  (LIPITOR) 20 MG tablet Take 1 tablet (20 mg total) by mouth daily.   Blood Pressure Monitor DEVI Use to check blood pressure daily. Please dispense XL blood pressure cuff if covered by insurance. I10.0.   Multiple Vitamins-Minerals (MULTIVITAMIN WITH MINERALS) tablet Take 2 tablets by  mouth daily.   RYBELSUS  3 MG TABS Take 1 tablet by mouth once daily   valsartan -hydrochlorothiazide  (DIOVAN -HCT)  160-12.5 MG tablet Take 1 tablet by mouth daily.   [DISCONTINUED] valsartan -hydrochlorothiazide  (DIOVAN -HCT) 160-25 MG tablet Take 1 tablet by mouth daily.     Allergies:   Shellfish allergy   Social History   Socioeconomic History   Marital status: Divorced    Spouse name: Not on file   Number of children: Not on file   Years of education: Not on file   Highest education level: Bachelor's degree (e.g., BA, AB, BS)  Occupational History   Not on file  Tobacco Use   Smoking status: Never   Smokeless tobacco: Never  Vaping Use   Vaping status: Never Used  Substance and Sexual Activity   Alcohol use: No   Drug use: No   Sexual activity: Not Currently    Birth control/protection: Surgical  Other Topics Concern   Not on file  Social History Narrative   Not on file   Social Drivers of Health   Financial Resource Strain: Low Risk  (05/02/2024)   Overall Financial Resource Strain (CARDIA)    Difficulty of Paying Living Expenses: Not hard at all  Food Insecurity: No Food Insecurity (05/02/2024)   Hunger Vital Sign    Worried About Running Out of Food in the Last Year: Never true    Ran Out of Food in the Last Year: Never true  Transportation Needs: No Transportation Needs (05/02/2024)   PRAPARE - Administrator, Civil Service (Medical): No    Lack of Transportation (Non-Medical): No  Physical Activity: Inactive (05/02/2024)   Exercise Vital Sign    Days of Exercise per Week: 0 days    Minutes of Exercise per Session: 0 min  Stress: No Stress Concern Present (05/02/2024)   [employer] of Occupational Health - Occupational Stress Questionnaire    Feeling of Stress: Not at all  Social Connections: Moderately Integrated (05/02/2024)   Social Connection and Isolation Panel    Frequency of Communication with Friends and Family: More than three times a week    Frequency of Social Gatherings with Friends and Family: Once a week    Attends Religious Services: More than 4  times per year    Active Member of [employer] or Organizations: Yes    Attends Banker Meetings: 1 to 4 times per year    Marital Status: Divorced     Family History: The patient's family history includes Diabetes in her mother; Hyperlipidemia in her father; Hypertension in her father, maternal grandfather, maternal grandmother, and mother.  ROS:   Please see the history of present illness.     All other systems reviewed and are negative.  EKGs/Labs/Other Studies Reviewed:         Recent Labs: 12/12/2023: ALT 15 12/26/2023: BUN 9; Creatinine, Ser 0.67; Potassium 4.4; Sodium 139 02/01/2024: TSH 0.982   Recent Lipid Panel    Component Value Date/Time   CHOL 197 08/15/2023 0910   TRIG 69 08/15/2023 0910   HDL 70 08/15/2023 0910   CHOLHDL 2.8 08/15/2023 0910   LDLCALC 114 (H) 08/15/2023 0910    Physical Exam:   VS:  BP 127/66 (BP Location: Left Arm)   Pulse 80   Ht 5' 3 (1.6 m)   Wt (!) 336 lb 6.4 oz (152.6 kg)   SpO2 99%   BMI 59.59 kg/m  , BMI Body mass index is 59.59 kg/m.  Vitals:  05/02/24 0815 05/02/24 0825  BP: (!) 139/92 127/66  Pulse: 80   Height: 5' 3 (1.6 m)   Weight: (!) 336 lb 6.4 oz (152.6 kg)   SpO2: 99%   BMI (Calculated): 59.61      GENERAL:  Well appearing, overweight HEENT: Pupils equal round and reactive, fundi not visualized, oral mucosa unremarkable NECK:  No jugular venous distention, waveform within normal limits, carotid upstroke brisk and symmetric, no bruits, no thyromegaly LYMPHATICS:  No cervical adenopathy LUNGS:  Clear to auscultation bilaterally HEART:  RRR.  PMI not displaced or sustained,S1 and S2 within normal limits, no S3, no S4, no clicks, no rubs, no murmurs ABD:  Flat, positive bowel sounds normal in frequency in pitch, no bruits, no rebound, no guarding, no midline pulsatile mass, no hepatomegaly, no splenomegaly EXT:  2 plus pulses throughout, no edema, no cyanosis no clubbing SKIN:  No rashes no nodules NEURO:   Cranial nerves II through XII grossly intact, motor grossly intact throughout PSYCH:  Cognitively intact, oriented to person place and time   ASSESSMENT/PLAN:     Assessment & Plan Hypertension Hypertension nearly at goal <130/80.  -If BP persistently difficult to control, consider renal artery duplex at follow up - Adjust valsartan  hydrochlorothiazide  160-12.5mg  daily to reduce diuretic due to urinary frequency and continue amlodipine  10mg  daily. MyChart message in 1 week. If urinary frequency not improved, consider trial of higher dose Valsartan  without hydrochlorothiazide  vs alternate diuretic such as Chlorthalidone or spironolactone.  - Encourage lifestyle modifications: weight loss, exercise. -Given information on [employer] exercise program  Severe sleep apnea Severe sleep apnea diagnosed with Dr. Chalice 2020 with CPAP discomfort. Discussed alternative treatments and emphasized importance for blood pressure control. - Encouraged to consider alternate CPAP mask trial  Knee pain Chronic knee pain with limited treatment options due to insurance. Discussed exercise options to minimize strain. - Provided information on [employer] exercise program    Screening for Secondary Hypertension:     02/01/2024    9:40 AM  Causes  Drugs/Herbals N/A  Sleep Apnea Screened     - Comments untreated, did not tolerate CPAP  Thyroid  Disease Screened  Hyperaldosteronism Screened  Pheochromocytoma Screened  Coarctation of the Aorta N/A     - Comments BP symmetrical  Compliance Screened     - Comments occasionally skips valsartan -hctz due to urinary frequency    Relevant Labs/Studies:    Latest Ref Rng & Units 12/26/2023    9:09 AM 12/12/2023   12:00 AM 08/15/2023    9:10 AM  Basic Labs  Sodium 134 - 144 mmol/L 139  142  141   Potassium 3.5 - 5.2 mmol/L 4.4  4.1  4.2   Creatinine 0.57 - 1.00 mg/dL 9.32  9.36  9.28        Latest Ref Rng & Units 02/01/2024    9:03 AM 09/09/2021     1:05 PM  Thyroid    TSH 0.450 - 4.500 uIU/mL 0.982  0.761        Latest Ref Rng & Units 02/01/2024    9:03 AM  Renin/Aldosterone   Aldosterone 0.0 - 30.0 ng/dL 89.1   Aldos/Renin Ratio 0.0 - 30.0 7.3        Latest Ref Rng & Units 02/01/2024    9:03 AM  Metanephrines/Catecholamines   Epinephrine  0.0 - 55.4 pg/mL 22.2   Norepinephrine 115 - 524 pg/mL 533   Dopamine 0.0 - 36.7 pg/mL <10.0   Metanephrines 0.0 - 88.0 pg/mL 28.9  Normetanephrines  0.0 - 244.0 pg/mL 141.3            Disposition:    FU with MD/APP/PharmD in 2-3 months    Medication Adjustments/Labs and Tests Ordered: Current medicines are reviewed at length with the patient today.  Concerns regarding medicines are outlined above.  No orders of the defined types were placed in this encounter.  Meds ordered this encounter  Medications   valsartan -hydrochlorothiazide  (DIOVAN -HCT) 160-12.5 MG tablet    Sig: Take 1 tablet by mouth daily.    Dispense:  90 tablet    Refill:  1    Dose decrease    Supervising Provider:   LONNI SLAIN [[id number]]     Signed, Reche GORMAN Finder, NP  05/02/2024 8:15 PM    [facility]

## 2024-05-28 DIAGNOSIS — R300 Dysuria: Secondary | ICD-10-CM | POA: Diagnosis not present

## 2024-05-28 DIAGNOSIS — N390 Urinary tract infection, site not specified: Secondary | ICD-10-CM | POA: Diagnosis not present

## 2024-05-30 ENCOUNTER — Ambulatory Visit: Payer: Self-pay | Admitting: Internal Medicine

## 2024-05-30 NOTE — Progress Notes (Deleted)
 I,Maleka Contino T Emmitt, CMA,acting as a Neurosurgeon for [name], MD.,have documented all relevant documentation on the behalf of [name], MD,as directed by  [name], MD while in the presence of [name], MD.  Subjective:  Patient ID: Ashley Harrison , female    DOB: Mar 05, 1971 , 53 y.o.   MRN: [id number]  No chief complaint on file.   HPI  HPI   Past Medical History:  Diagnosis Date   Hypertension    Iron  deficiency anemia due to chronic blood loss 10/31/2017   Iron  deficiency anemia, unspecified    Menometrorrhagia 10/31/2017   Obesity      Family History  Problem Relation Age of Onset   Hypertension Mother    Diabetes Mother    Hyperlipidemia Father    Hypertension Father    Hypertension Maternal Grandmother    Hypertension Maternal Grandfather      Current Outpatient Medications:    amLODipine  (NORVASC ) 10 MG tablet, Take 1 tablet (10 mg total) by mouth daily., Disp: , Rfl:    atorvastatin  (LIPITOR) 20 MG tablet, Take 1 tablet (20 mg total) by mouth daily., Disp: 90 tablet, Rfl: 2   Blood Pressure Monitor DEVI, Use to check blood pressure daily. Please dispense XL blood pressure cuff if covered by insurance. I10.0., Disp: 1 each, Rfl: 0   Multiple Vitamins-Minerals (MULTIVITAMIN WITH MINERALS) tablet, Take 2 tablets by mouth daily., Disp: , Rfl:    RYBELSUS  3 MG TABS, Take 1 tablet by mouth once daily, Disp: 30 tablet, Rfl: 0   valsartan -hydrochlorothiazide  (DIOVAN -HCT) 160-12.5 MG tablet, Take 1 tablet by mouth daily., Disp: 90 tablet, Rfl: 1   Allergies  Allergen Reactions   Shellfish Allergy Hives     Review of Systems  Constitutional: Negative.   Respiratory: Negative.    Cardiovascular: Negative.   Neurological: Negative.   Psychiatric/Behavioral: Negative.       There were no vitals filed for this visit. There is no height or weight on file to calculate BMI.  Wt Readings from Last 3 Encounters:  05/02/24 (!) 336 lb 6.4 oz (152.6 kg)   04/18/24 (!) 335 lb (152 kg)  02/22/24 (!) 336 lb 6.4 oz (152.6 kg)     Objective:  Physical Exam      Assessment And Plan:  There are no diagnoses linked to this encounter.   No follow-ups on file.  Patient was given opportunity to ask questions. Patient verbalized understanding of the plan and was able to repeat key elements of the plan. All questions were answered to their satisfaction.  [name], MD  I, [name], MD, have reviewed all documentation for this visit. The documentation on 05/30/24 for the exam, diagnosis, procedures, and orders are all accurate and complete.   IF YOU HAVE BEEN REFERRED TO A SPECIALIST, IT MAY TAKE 1-2 WEEKS TO SCHEDULE/PROCESS THE REFERRAL. IF YOU HAVE NOT HEARD FROM US /SPECIALIST IN TWO WEEKS, PLEASE GIVE US  A CALL AT [phone].   THE PATIENT IS ENCOURAGED TO PRACTICE SOCIAL DISTANCING DUE TO THE COVID-19 PANDEMIC.

## 2024-05-30 NOTE — Patient Instructions (Incomplete)

## 2024-06-05 DIAGNOSIS — N390 Urinary tract infection, site not specified: Secondary | ICD-10-CM | POA: Diagnosis not present

## 2024-06-05 DIAGNOSIS — R300 Dysuria: Secondary | ICD-10-CM | POA: Diagnosis not present

## 2024-06-15 ENCOUNTER — Emergency Department (HOSPITAL_BASED_OUTPATIENT_CLINIC_OR_DEPARTMENT_OTHER)
Admission: EM | Admit: 2024-06-15 | Discharge: 2024-06-15 | Disposition: A | Discharge status: Home / Self Care | Attending: Emergency Medicine | Admitting: Emergency Medicine

## 2024-06-15 ENCOUNTER — Emergency Department (HOSPITAL_BASED_OUTPATIENT_CLINIC_OR_DEPARTMENT_OTHER)

## 2024-06-15 ENCOUNTER — Encounter (HOSPITAL_BASED_OUTPATIENT_CLINIC_OR_DEPARTMENT_OTHER): Payer: Self-pay

## 2024-06-15 DIAGNOSIS — Z79899 Other long term (current) drug therapy: Secondary | ICD-10-CM | POA: Insufficient documentation

## 2024-06-15 DIAGNOSIS — I10 Essential (primary) hypertension: Secondary | ICD-10-CM | POA: Insufficient documentation

## 2024-06-15 DIAGNOSIS — L03116 Cellulitis of left lower limb: Secondary | ICD-10-CM | POA: Insufficient documentation

## 2024-06-15 DIAGNOSIS — E119 Type 2 diabetes mellitus without complications: Secondary | ICD-10-CM | POA: Insufficient documentation

## 2024-06-15 DIAGNOSIS — M79605 Pain in left leg: Secondary | ICD-10-CM | POA: Diagnosis not present

## 2024-06-15 LAB — CBC WITH DIFFERENTIAL/PLATELET
Abs Immature Granulocytes: 0.02 K/uL (ref 0.00–0.07)
Basophils Absolute: 0 K/uL (ref 0.0–0.1)
Basophils Relative: 0 %
Eosinophils Absolute: 0.1 K/uL (ref 0.0–0.5)
Eosinophils Relative: 2 %
HCT: 35.9 % — ABNORMAL LOW (ref 36.0–46.0)
Hemoglobin: 11 g/dL — ABNORMAL LOW (ref 12.0–15.0)
Immature Granulocytes: 0 %
Lymphocytes Relative: 38 %
Lymphs Abs: 2.6 K/uL (ref 0.7–4.0)
MCH: 23.9 pg — ABNORMAL LOW (ref 26.0–34.0)
MCHC: 30.6 g/dL (ref 30.0–36.0)
MCV: 77.9 fL — ABNORMAL LOW (ref 80.0–100.0)
Monocytes Absolute: 0.3 K/uL (ref 0.1–1.0)
Monocytes Relative: 5 %
Neutro Abs: 3.7 K/uL (ref 1.7–7.7)
Neutrophils Relative %: 55 %
Platelets: 331 K/uL (ref 150–400)
RBC: 4.61 MIL/uL (ref 3.87–5.11)
RDW: 16.8 % — ABNORMAL HIGH (ref 11.5–15.5)
WBC: 6.8 K/uL (ref 4.0–10.5)
nRBC: 0 % (ref 0.0–0.2)

## 2024-06-15 LAB — BASIC METABOLIC PANEL WITH GFR
Anion gap: 13 (ref 5–15)
BUN: 6 mg/dL (ref 6–20)
CO2: 25 mmol/L (ref 22–32)
Calcium: 9.8 mg/dL (ref 8.9–10.3)
Chloride: 104 mmol/L (ref 98–111)
Creatinine, Ser: 0.65 mg/dL (ref 0.44–1.00)
GFR, Estimated: >60 mL/min (ref >60)
Glucose, Bld: 115 mg/dL — ABNORMAL HIGH (ref 70–99)
Potassium: 3.9 mmol/L (ref 3.5–5.1)
Sodium: 142 mmol/L (ref 135–145)

## 2024-06-15 MED ORDER — CEPHALEXIN 500 MG PO CAPS: 500.0000 mg | ORAL_CAPSULE | Freq: Four times a day (QID) | ORAL | 0 refills | Status: DC

## 2024-06-15 MED ORDER — KETOROLAC TROMETHAMINE 30 MG/ML IJ SOLN: 15.0000 mg | Freq: Once | INTRAMUSCULAR | Status: DC

## 2024-06-15 MED ORDER — KETOROLAC TROMETHAMINE 60 MG/2ML IM SOLN
60.0000 mg | Freq: Once | INTRAMUSCULAR | Status: AC
  Administered 2024-06-15: 60 mg via INTRAMUSCULAR
  Filled 2024-06-15: qty 2

## 2024-06-15 MED ORDER — FLUCONAZOLE 150 MG PO TABS: 150.0000 mg | ORAL_TABLET | Freq: Once | ORAL | 0 refills | Status: AC

## 2024-06-15 NOTE — ED Triage Notes (Signed)
 She has a combination of chronic and acute non-traumatic left leg pain. She c/o pain and pressure at left lower leg. She capably ambulates with a limp.

## 2024-06-15 NOTE — Discharge Instructions (Addendum)
Contact a health care provider if: You have a fever. Your symptoms do not improve within 1-2 days of starting treatment or you develop new symptoms. Your bone or joint underneath the infected area becomes painful after the skin has healed. Your infection returns in the same area or another area. Signs of this may include: You notice a swollen bump in the infected area. Your red area gets larger, turns dark in color, or becomes more painful. Drainage increases. Pus or a bad smell develops in your infected area. You have more pain. You feel ill and have muscle aches and weakness. You develop vomiting or diarrhea that will not go away. Get help right away if: You notice red streaks coming from the infected area. You notice the skin turns purple or black and falls off. This symptom may be an emergency. Get help right away. Call 911. Do not wait to see if the symptom will go away. Do not drive yourself to the hospital.

## 2024-06-15 NOTE — ED Provider Notes (Signed)
 Ashley Harrison   CSN: [id number] Arrival date & time: 06/15/24  0825     Patient presents with: Leg Pain   Ashley Harrison is a 53 y.o. female with a past medical history of obesity, diabetes, hypertension who presents emergency department chief complaint of left lower extremity pain.  She had onset of pain 1 week ago which she describes as heavy, tight, throbbing and aching.  Patient thought it might be due to her sitting for long periods of time.  She denies increased pain with ambulation she does have pain more significantly at the posterior aspect of the left lower extremity just above the Achilles region.  Pain is not worsened with movement at the ankle.  She denies fevers, chills, chest pain, shortness of breath or injuries.    Leg Pain      Prior to Admission medications   Medication Sig Start Date End Date Taking? Authorizing Provider  amLODipine  (NORVASC ) 10 MG tablet Take 1 tablet (10 mg total) by mouth daily. 12/26/23 12/25/24  Jarold Medici, MD  atorvastatin  (LIPITOR) 20 MG tablet Take 1 tablet (20 mg total) by mouth daily. 02/22/24   Jarold Medici, MD  Blood Pressure Monitor DEVI Use to check blood pressure daily. Please dispense XL blood pressure cuff if covered by insurance. I10.0. 01/24/23   Jarold Medici, MD  Multiple Vitamins-Minerals (MULTIVITAMIN WITH MINERALS) tablet Take 2 tablets by mouth daily.    [provider]  RYBELSUS  3 MG TABS Take 1 tablet by mouth once daily 12/15/22   Jarold Medici, MD  valsartan -hydrochlorothiazide  (DIOVAN -HCT) 160-12.5 MG tablet Take 1 tablet by mouth daily. 05/02/24   Walker, Caitlin S, NP    Allergies: Shellfish allergy    Review of Systems  Updated Vital Signs BP (!) 175/104 (BP Location: Right Arm)   Pulse 81   Temp 97.8 F (36.6 C) (Oral)   Resp 18   LMP  (LMP Unknown)   SpO2 97%   Physical Exam Vitals and nursing Harrison reviewed.  Constitutional:       General: She is not in acute distress.    Appearance: She is well-developed. She is not diaphoretic.  HENT:     Head: Normocephalic and atraumatic.     Right Ear: External ear normal.     Left Ear: External ear normal.     Nose: Nose normal.     Mouth/Throat:     Mouth: Mucous membranes are moist.  Eyes:     General: No scleral icterus.    Conjunctiva/sclera: Conjunctivae normal.  Cardiovascular:     Rate and Rhythm: Normal rate and regular rhythm.     Heart sounds: Normal heart sounds. No murmur heard.    No friction rub. No gallop.  Pulmonary:     Effort: Pulmonary effort is normal. No respiratory distress.     Breath sounds: Normal breath sounds.  Abdominal:     General: Bowel sounds are normal. There is no distension.     Palpations: Abdomen is soft. There is no mass.     Tenderness: There is no abdominal tenderness. There is no guarding.  Musculoskeletal:     Cervical back: Normal range of motion.     Comments: I examined the patient's lower extremities.  She has signs of chronic venous stasis tattooing and edema with thickened, woody, darkened tissue at the lower extremities.  The left lower extremity is significantly more swollen than the right with warmth to palpation, tenderness in  the posterior calf, no tenderness along the Achilles tendon no pain with passive or active flexion extension at the ankle, the edema is pitting.  Skin:    General: Skin is warm and dry.  Neurological:     Mental Status: She is alert and oriented to person, place, and time.  Psychiatric:        Behavior: Behavior normal.     (all labs ordered are listed, but only abnormal results are displayed) Labs Reviewed - No data to display  EKG: None  Radiology: No results found.   Procedures   Medications Ordered in the ED - No data to display  Clinical Course as of 06/15/24 1115  Sat Jun 15, 2024  178 53 year old female with a past medical history of obesity.  She has signs of chronic  venous stasis tattooing and edema in the lower extremities however currently has unilateral leg swelling.  Most important is ruling out DVT in the setting of unilateral leg swelling.  I have low suspicion for Achilles tendinitis as patient is able to move the foot and ankle without pain and her pain is not made worse with specifically by ambulation.  I have also considered cellulitis in the setting of her chronic edema.  She denies any chest pain or shortness of breath at this time suggestive of PE.  Current plan is basic labs and a lower extremity DVT study. [AH]    Clinical Course User Index [AH] Arloa Chroman, PA-C                                 Medical Decision Making 53 year old female presents emergency department with unilateral leg swelling and pain.  Differential diagnosis as per ED course.  I reviewed and interpreted the patient's labs.  She has no elevated white blood cell count.  Mild hyperglycemia at glucose of 115.  I also visualized interpreted left lower extremity DVT study which is negative for deep vein thrombosis.  Given findings I have highest suspicion for cellulitis.  She has chronic comorbidities of obesity, chronic peripheral edema and hypertension as well as diabetes.  At this time patient will be treated with oral Keflex .  She does not apparently have a history of MRSA.  Will also supply a single dose of Diflucan.  Discussed return precautions.  Appropriate for discharge  Amount and/or Complexity of Data Reviewed Labs: ordered. Radiology: independent interpretation performed.  Risk Prescription drug management.        Final diagnoses:  None    ED Discharge Orders     None          Arloa Chroman, PA-C 06/15/24 1158    Jerrol Agent, MD 06/15/24 1206

## 2024-07-16 NOTE — Progress Notes (Signed)
 [facility] Assessment:    Date:  07/18/2024   ID:  Ashley Harrison, DOB 1971-05-28, MRN [id number]  PCP:  Jarold Medici, MD  Cardiologist:  None  Nephrologist:  Referring MD: Jarold Medici, MD   CC: Hypertension  History of Present Illness:    Ashley Harrison is a 53 y.o. female with a hx of hypertension, hyperlipidemia, DM2, OSAhere to follow up  care in the [facility].   Echo 12/28/2023 normal LVEF, grade 1 diastolic dysfunction, RV mildly enlarged, no significant valvular abnormalities.  Discussed the use of AI scribe software for clinical note transcription with the patient, who gave verbal consent to proceed.  History of Present Illness Established with [facility] 02/01/24. Hypertension was diagnosed postpartum with significant fluid retention, requiring hospitalization and IV diuretics. Blood pressure remains elevated, except during her second pregnancy.She noted frequent urination, inconvenient due to her job requiring travel.  Family history includes hypertension in her mother, father, and maternal grandparents. Paternal grandparents' medical history is unknown.  Severe sleep apnea diagnosed in 2020, prescribed CPAP, but non-compliant due to discomfort with the nasal pillow mask. Knee issues limited exercise.  She was encouraged to trial different CPAP mask. Referred to PREP exercise program but unable to participate due to daytime classes conflicting with work schedule. TSH, renin-aldo,  metanephrines unremarkable. Plasma catecholamines mildly elevated, recommended for 24h urine catecholamines.  BP reasonably controlled at 130/84 with plan for lifestyle changes to hopefully be able to reduce diuretic dose.  She was last seen 05/02/24 with BP nearly at goal. PCP had reduced her to valsartan -hydrochlorothiazide  160-12.5g half tablet daily due to urinary frequency. Valsartan -hydrochlorothiazide  adjusted to 160-12.5mg  daily    ED visit 06/15/24 with LLE cellulitis treated with oral Keflex .  Presents today for follow up. She has not been monitoring her blood pressure at home due to an ill-fitting cuff. She is making an effort to take her medication regularly, though she occasionally misses doses. Notes daytime urinary frequency improved with reducing hydrochlorothiazide  component from 25mg  to 12.5mg .   She has lost seven pounds over the past two months, attributed to dietary changes such as reducing fast food intake and cooking at home. She notes a reduction in leg swelling, previously attributed to fluid retention.   Previous antihypertensives: Lisinopril-hydrochlorothiazide  Lasix  Past Medical History:  Diagnosis Date   Hypertension    Iron  deficiency anemia due to chronic blood loss 10/31/2017   Iron  deficiency anemia, unspecified    Menometrorrhagia 10/31/2017   Obesity     Past Surgical History:  Procedure Laterality Date   CESAREAN SECTION  01/2001, 05/2004    x 2   COLONOSCOPY WITH PROPOFOL  N/A 03/29/2022   Procedure: COLONOSCOPY WITH PROPOFOL ;  Surgeon: Elicia Claw, MD;  Location: [facility];  Service: Gastroenterology;  Laterality: N/A;   HYSTEROSCOPY WITH NOVASURE N/A 02/06/2014   Procedure: HYSTEROSCOPY WITH NOVASURE;  Surgeon: Nathanel LELON Bunker, MD;  Location: [facility];  Service: Gynecology;  Laterality: N/A;  1hr OR time   POLYPECTOMY  03/29/2022   Procedure: POLYPECTOMY;  Surgeon: Elicia Claw, MD;  Location: [facility];  Service: Gastroenterology;;   TUBAL LIGATION  2005   WISDOM TOOTH EXTRACTION  1988    Current Medications: Current Meds  Medication Sig   amLODipine  (NORVASC ) 10 MG tablet Take 1 tablet (10 mg total) by mouth daily.   atorvastatin  (LIPITOR) 20 MG tablet Take 1 tablet (20 mg total) by mouth daily.   Blood Pressure Monitor DEVI Use to check  blood pressure daily. Please dispense XL blood pressure cuff if covered by insurance. I10.0.   Multiple Vitamins-Minerals  (MULTIVITAMIN WITH MINERALS) tablet Take 2 tablets by mouth daily.   RYBELSUS  3 MG TABS Take 1 tablet by mouth once daily   valsartan -hydrochlorothiazide  (DIOVAN -HCT) 160-12.5 MG tablet Take 1 tablet by mouth daily.     Allergies:   Shellfish allergy   Social History   Socioeconomic History   Marital status: Divorced    Spouse name: Not on file   Number of children: Not on file   Years of education: Not on file   Highest education level: Bachelor's degree (e.g., BA, AB, BS)  Occupational History   Not on file  Tobacco Use   Smoking status: Never    Passive exposure: Never   Smokeless tobacco: Never  Vaping Use   Vaping status: Never Used  Substance and Sexual Activity   Alcohol use: No   Drug use: No   Sexual activity: Not Currently    Birth control/protection: Surgical  Other Topics Concern   Not on file  Social History Narrative   Not on file   Social Drivers of Health   Financial Resource Strain: Low Risk  (05/02/2024)   Overall Financial Resource Strain (CARDIA)    Difficulty of Paying Living Expenses: Not hard at all  Food Insecurity: No Food Insecurity (07/18/2024)   Hunger Vital Sign    Worried About Running Out of Food in the Last Year: Never true    Ran Out of Food in the Last Year: Never true  Transportation Needs: No Transportation Needs (05/02/2024)   PRAPARE - Administrator, Civil Service (Medical): No    Lack of Transportation (Non-Medical): No  Physical Activity: Inactive (05/02/2024)   Exercise Vital Sign    Days of Exercise per Week: 0 days    Minutes of Exercise per Session: 0 min  Stress: No Stress Concern Present (05/02/2024)   [employer] of Occupational Health - Occupational Stress Questionnaire    Feeling of Stress: Not at all  Social Connections: Moderately Integrated (05/02/2024)   Social Connection and Isolation Panel    Frequency of Communication with Friends and Family: More than three times a week    Frequency of Social  Gatherings with Friends and Family: Once a week    Attends Religious Services: More than 4 times per year    Active Member of [employer] or Organizations: Yes    Attends Banker Meetings: 1 to 4 times per year    Marital Status: Divorced     Family History: The patient's family history includes Diabetes in her mother; Hyperlipidemia in her father; Hypertension in her father, maternal grandfather, maternal grandmother, and mother.  ROS:   Please see the history of present illness.     All other systems reviewed and are negative.  EKGs/Labs/Other Studies Reviewed:         Recent Labs: 12/12/2023: ALT 15 02/01/2024: TSH 0.982 06/15/2024: BUN 6; Creatinine, Ser 0.65; Hemoglobin 11.0; Platelets 331; Potassium 3.9; Sodium 142   Recent Lipid Panel    Component Value Date/Time   CHOL 197 08/15/2023 0910   TRIG 69 08/15/2023 0910   HDL 70 08/15/2023 0910   CHOLHDL 2.8 08/15/2023 0910   LDLCALC 114 (H) 08/15/2023 0910    Physical Exam:   VS:  BP (!) 138/94 (BP Location: Right Arm, Patient Position: Sitting, Cuff Size: Large)   Pulse 92   Ht 5' 3 (1.6 m)  Wt (!) 329 lb 12.8 oz (149.6 kg)   SpO2 97%   BMI 58.42 kg/m  , BMI Body mass index is 58.42 kg/m.  Vitals:   07/18/24 0801  BP: (!) 138/94  Pulse: 92  Height: 5' 3 (1.6 m)  Weight: (!) 329 lb 12.8 oz (149.6 kg)  SpO2: 97%  BMI (Calculated): 58.44      GENERAL:  Well appearing, overweight HEENT: Pupils equal round and reactive, fundi not visualized, oral mucosa unremarkable NECK:  No jugular venous distention, waveform within normal limits, carotid upstroke brisk and symmetric, no bruits, no thyromegaly LYMPHATICS:  No cervical adenopathy LUNGS:  Clear to auscultation bilaterally HEART:  RRR.  PMI not displaced or sustained,S1 and S2 within normal limits, no S3, no S4, no clicks, no rubs, no murmurs ABD:  Flat, positive bowel sounds normal in frequency in pitch, no bruits, no rebound, no guarding, no midline  pulsatile mass, no hepatomegaly, no splenomegaly EXT:  2 plus pulses throughout, no edema, no cyanosis no clubbing SKIN:  No rashes no nodules NEURO:  Cranial nerves II through XII grossly intact, motor grossly intact throughout PSYCH:  Cognitively intact, oriented to person place and time   ASSESSMENT/PLAN:    Assessment & Plan Hypertension Hypertension nearly at goal <130/80.  - Continue valsartan  hydrochlorothiazide  160-12.5mg  daily, amlodipine  10mg  daily.  -She wishes to continue to work on lifestyle modifications to improve her BP, if BP not at goal at follow up plan to transition to Valsartan -hydrochlorothiazide  320-12.5mg  daily   Severe sleep apnea Severe sleep apnea diagnosed with Dr. Chalice 2020 with CPAP discomfort. Discussed alternative treatments and emphasized importance for blood pressure control. - Encouraged to consider alternate CPAP mask trial    Screening for Secondary Hypertension:     02/01/2024    9:40 AM  Causes  Drugs/Herbals N/A  Sleep Apnea Screened     - Comments untreated, did not tolerate CPAP  Thyroid  Disease Screened  Hyperaldosteronism Screened  Pheochromocytoma Screened  Coarctation of the Aorta N/A     - Comments BP symmetrical  Compliance Screened     - Comments occasionally skips valsartan -hctz due to urinary frequency    Relevant Labs/Studies:    Latest Ref Rng & Units 06/15/2024   10:25 AM 12/26/2023    9:09 AM 12/12/2023   12:00 AM  Basic Labs  Sodium 135 - 145 mmol/L 142  139  142   Potassium 3.5 - 5.1 mmol/L 3.9  4.4  4.1   Creatinine 0.44 - 1.00 mg/dL 9.34  9.32  9.36        Latest Ref Rng & Units 02/01/2024    9:03 AM 09/09/2021    1:05 PM  Thyroid    TSH 0.450 - 4.500 uIU/mL 0.982  0.761        Latest Ref Rng & Units 02/01/2024    9:03 AM  Renin/Aldosterone   Aldosterone 0.0 - 30.0 ng/dL 89.1   Aldos/Renin Ratio 0.0 - 30.0 7.3        Latest Ref Rng & Units 02/01/2024    9:03 AM  Metanephrines/Catecholamines    Epinephrine  0.0 - 55.4 pg/mL 22.2   Norepinephrine 115 - 524 pg/mL 533   Dopamine 0.0 - 36.7 pg/mL <10.0   Metanephrines 0.0 - 88.0 pg/mL 28.9   Normetanephrines  0.0 - 244.0 pg/mL 141.3            Disposition:    FU with MD/APP/PharmD in 5 months    Medication Adjustments/Labs and Tests Ordered: Current  medicines are reviewed at length with the patient today.  Concerns regarding medicines are outlined above.  No orders of the defined types were placed in this encounter.  No orders of the defined types were placed in this encounter.    Signed, Reche GORMAN Finder, NP  07/18/2024 8:06 AM    [facility]

## 2024-07-18 ENCOUNTER — Encounter (HOSPITAL_BASED_OUTPATIENT_CLINIC_OR_DEPARTMENT_OTHER): Payer: Self-pay | Admitting: Family

## 2024-07-18 ENCOUNTER — Ambulatory Visit (HOSPITAL_BASED_OUTPATIENT_CLINIC_OR_DEPARTMENT_OTHER): Admitting: Family

## 2024-07-18 VITALS — BP 138/94 | HR 92 | Ht 63.0 in | Wt 329.8 lb

## 2024-07-18 DIAGNOSIS — E119 Type 2 diabetes mellitus without complications: Secondary | ICD-10-CM | POA: Diagnosis not present

## 2024-07-18 DIAGNOSIS — I10 Essential (primary) hypertension: Secondary | ICD-10-CM

## 2024-07-18 DIAGNOSIS — G4733 Obstructive sleep apnea (adult) (pediatric): Secondary | ICD-10-CM

## 2024-07-18 NOTE — Patient Instructions (Addendum)
 Medication Instructions:  Continue your current medications.    Follow-Up: Please follow up in 4 months in [facility] with Dr. Raford, Reche Finder, NP or Allean Mink PharmD    Special Instructions:    Please bring your blood pressure cuff to your next office visit and we will see how accurate it is.

## 2024-07-26 ENCOUNTER — Ambulatory Visit: Admission: EM | Admit: 2024-07-26 | Discharge: 2024-07-26 | Disposition: A | Discharge status: Home / Self Care

## 2024-07-26 ENCOUNTER — Encounter: Payer: Self-pay | Admitting: Emergency Medicine

## 2024-07-26 DIAGNOSIS — N3001 Acute cystitis with hematuria: Secondary | ICD-10-CM | POA: Insufficient documentation

## 2024-07-26 LAB — POCT URINE DIPSTICK
Glucose, UA: 250 mg/dL — AB
Nitrite, UA: POSITIVE — AB
POC PROTEIN,UA: >=300 — AB
Spec Grav, UA: 1.01 (ref 1.010–1.025)
Urobilinogen, UA: >=8 U/dL — AB
pH, UA: 5 (ref 5.0–8.0)

## 2024-07-26 MED ORDER — SULFAMETHOXAZOLE-TRIMETHOPRIM 800-160 MG PO TABS: 1.0000 | ORAL_TABLET | Freq: Two times a day (BID) | ORAL | 0 refills | Status: AC

## 2024-07-26 NOTE — ED Provider Notes (Signed)
 [name]    CSN: [id number] Arrival date & time: 07/26/24  1255      History   Chief Complaint Chief Complaint  Patient presents with   Dysuria   Urinary Frequency    HPI Ashley Harrison is a 53 y.o. female.   Patient presents today due to dysuria and urinary frequency since this morning.  Patient denies fever, chills, nausea, vomiting, back pain.  Patient states that she took Azo this morning without relief of symptoms.  The history is provided by the patient.  Dysuria Urinary Frequency    Past Medical History:  Diagnosis Date   Hypertension    Iron  deficiency anemia due to chronic blood loss 10/31/2017   Iron  deficiency anemia, unspecified    Menometrorrhagia 10/31/2017   Obesity     Patient Active Problem List   Diagnosis Date Noted   Abnormal EKG 11/29/2023   Pneumonia due to infectious organism 11/29/2023   Morbid obesity with body mass index (BMI) of 50.0 to 59.9 in adult [facility] 11/27/2023   Encounter for general adult medical examination without abnormal findings 10/02/2023   Primary osteoarthritis of left knee 08/15/2023   Pure hypercholesterolemia 05/01/2023   Type 2 diabetes mellitus with morbid obesity (HCC) 09/19/2022   Chronic venous insufficiency 07/19/2022   Insulin  resistance 12/13/2021   OSA (obstructive sleep apnea) 06/19/2019   Fatigue due to sleep pattern disturbance 03/31/2019   Malignant hypertension 10/09/2018   Snoring 10/09/2018   Class 3 severe obesity due to excess calories with serious comorbidity and body mass index (BMI) of 50.0 to 59.9 in adult (HCC) 10/09/2018   Benign essential hypertension 10/09/2018   Morbid obesity due to excess calories (HCC) 06/05/2018   Iron  deficiency anemia 10/31/2017   Menorrhagia 10/31/2017   Hypertensive disorder 06/15/2017   Anemia 09/21/2011    Past Surgical History:  Procedure Laterality Date   CESAREAN SECTION  01/2001, 05/2004    x 2   COLONOSCOPY WITH PROPOFOL  N/A 03/29/2022    Procedure: COLONOSCOPY WITH PROPOFOL ;  Surgeon: Elicia Claw, MD;  Location: [facility];  Service: Gastroenterology;  Laterality: N/A;   HYSTEROSCOPY WITH NOVASURE N/A 02/06/2014   Procedure: HYSTEROSCOPY WITH NOVASURE;  Surgeon: Nathanel LELON Bunker, MD;  Location: [facility];  Service: Gynecology;  Laterality: N/A;  1hr OR time   POLYPECTOMY  03/29/2022   Procedure: POLYPECTOMY;  Surgeon: Elicia Claw, MD;  Location: [facility];  Service: Gastroenterology;;   TUBAL LIGATION  2005   WISDOM TOOTH EXTRACTION  1988    OB History   No obstetric history on file.      Home Medications    Prior to Admission medications   Medication Sig Start Date End Date Taking? Authorizing Provider  amLODipine  (NORVASC ) 10 MG tablet Take 1 tablet (10 mg total) by mouth daily. 12/26/23 12/25/24 Yes Jarold Medici, MD  atorvastatin  (LIPITOR) 20 MG tablet Take 1 tablet (20 mg total) by mouth daily. 02/22/24  Yes Jarold Medici, MD  Multiple Vitamins-Minerals (MULTIVITAMIN WITH MINERALS) tablet Take 2 tablets by mouth daily.   Yes [provider]  sulfamethoxazole -trimethoprim  (BACTRIM  DS) 800-160 MG tablet Take 1 tablet by mouth 2 (two) times daily for 5 days. 07/26/24 07/31/24 Yes Andra Krabbe C, PA-C  valsartan -hydrochlorothiazide  (DIOVAN -HCT) 160-12.5 MG tablet Take 1 tablet by mouth daily. 05/02/24  Yes Vannie Reche RAMAN, NP  Blood Pressure Monitor DEVI Use to check blood pressure daily. Please dispense XL blood pressure cuff if covered by insurance. I10.0. Patient not taking: Reported on  07/26/2024 01/24/23   Jarold Medici, MD  RYBELSUS  3 MG TABS Take 1 tablet by mouth once daily Patient not taking: Reported on 07/26/2024 12/15/22   Jarold Medici, MD    Family History Family History  Problem Relation Age of Onset   Hypertension Mother    Diabetes Mother    Hyperlipidemia Father    Hypertension Father    Hypertension Maternal Grandmother    Hypertension Maternal Grandfather      Social History Social History   Tobacco Use   Smoking status: Never    Passive exposure: Never   Smokeless tobacco: Never  Vaping Use   Vaping status: Never Used  Substance Use Topics   Alcohol use: No   Drug use: No     Allergies   Shellfish allergy   Review of Systems Review of Systems  Genitourinary:  Positive for dysuria and frequency.     Physical Exam Triage Vital Signs ED Triage Vitals [07/26/24 1344]  Encounter Vitals Group     BP      Girls Systolic BP Percentile      Girls Diastolic BP Percentile      Boys Systolic BP Percentile      Boys Diastolic BP Percentile      Pulse      Resp      Temp      Temp src      SpO2      Weight      Height      Head Circumference      Peak Flow      Pain Score 0     Pain Loc      Pain Education      Exclude from Growth Chart    No data found.  Updated Vital Signs LMP 05/29/2024 (Approximate)   Visual Acuity Right Eye Distance:   Left Eye Distance:   Bilateral Distance:    Right Eye Near:   Left Eye Near:    Bilateral Near:     Physical Exam Vitals and nursing note reviewed.  Constitutional:      General: She is not in acute distress.    Appearance: Normal appearance. She is not ill-appearing, toxic-appearing or diaphoretic.  Eyes:     General: No scleral icterus. Cardiovascular:     Rate and Rhythm: Normal rate and regular rhythm.     Heart sounds: Normal heart sounds.  Pulmonary:     Effort: Pulmonary effort is normal. No respiratory distress.     Breath sounds: Normal breath sounds. No wheezing or rhonchi.  Abdominal:     General: Abdomen is flat. Bowel sounds are normal.     Palpations: Abdomen is soft.     Tenderness: There is abdominal tenderness. There is no right CVA tenderness or left CVA tenderness.  Skin:    General: Skin is warm.  Neurological:     Mental Status: She is alert and oriented to person, place, and time.  Psychiatric:        Mood and Affect: Mood normal.         Behavior: Behavior normal.      UC Treatments / Results  Labs (all labs ordered are listed, but only abnormal results are displayed) Labs Reviewed  POCT URINE DIPSTICK - Abnormal; Notable for the following components:      Result Value   Color, UA orange (*)    Clarity, UA cloudy (*)    Glucose, UA =250 (*)    Bilirubin, UA  small (*)    Ketones, POC UA small (15) (*)    Blood, UA large (*)    POC PROTEIN,UA >=300 (*)    Urobilinogen, UA >=8.0 (*)    Nitrite, UA Positive (*)    Leukocytes, UA Large (3+) (*)    All other components within normal limits  URINE CULTURE    EKG   Radiology No results found.  Procedures Procedures (including critical care time)  Medications Ordered in UC Medications - No data to display  Initial Impression / Assessment and Plan / UC Course  I have reviewed the triage vital signs and the nursing notes.  Pertinent labs & imaging results that were available during my care of the patient were reviewed by me and considered in my medical decision making (see chart for details).    Final Clinical Impressions(s) / UC Diagnoses   Final diagnoses:  Acute cystitis with hematuria   Discharge Instructions   None    ED Prescriptions     Medication Sig Dispense Auth. Provider   sulfamethoxazole -trimethoprim  (BACTRIM  DS) 800-160 MG tablet Take 1 tablet by mouth 2 (two) times daily for 5 days. 10 tablet Andra Corean BROCKS, PA-C      PDMP not reviewed this encounter.   Andra Corean BROCKS, PA-C 07/26/24 1442

## 2024-07-26 NOTE — ED Triage Notes (Signed)
 Pt reports dysuria, frequency, and urgency that started this morning. Denies hematuria, fevers, or flank pain. Has taken AZO with no relief.

## 2024-07-28 LAB — URINE CULTURE: Culture: >=100000 — AB

## 2024-07-29 ENCOUNTER — Ambulatory Visit (HOSPITAL_COMMUNITY): Payer: Self-pay

## 2024-10-02 ENCOUNTER — Ambulatory Visit: Payer: BC Managed Care – PPO | Admitting: Internal Medicine

## 2024-10-02 ENCOUNTER — Encounter: Payer: Self-pay | Admitting: Internal Medicine

## 2024-10-02 VITALS — BP 144/102 | HR 90 | Temp 98.1°F | Ht 63.0 in | Wt 332.6 lb

## 2024-10-02 DIAGNOSIS — M1712 Unilateral primary osteoarthritis, left knee: Secondary | ICD-10-CM

## 2024-10-02 DIAGNOSIS — E7800 Pure hypercholesterolemia, unspecified: Secondary | ICD-10-CM

## 2024-10-02 DIAGNOSIS — G4733 Obstructive sleep apnea (adult) (pediatric): Secondary | ICD-10-CM

## 2024-10-02 DIAGNOSIS — B351 Tinea unguium: Secondary | ICD-10-CM

## 2024-10-02 DIAGNOSIS — I10 Essential (primary) hypertension: Secondary | ICD-10-CM

## 2024-10-02 DIAGNOSIS — Z0000 Encounter for general adult medical examination without abnormal findings: Secondary | ICD-10-CM

## 2024-10-02 LAB — LIPID PANEL
Chol/HDL Ratio: 3.3 ratio (ref 0.0–4.4)
Cholesterol, Total: 241 mg/dL — ABNORMAL HIGH (ref 100–199)
HDL: 72 mg/dL
LDL Chol Calc (NIH): 157 mg/dL — ABNORMAL HIGH (ref 0–99)
Triglycerides: 72 mg/dL (ref 0–149)
VLDL Cholesterol Cal: 12 mg/dL (ref 5–40)

## 2024-10-02 LAB — HEMOGLOBIN A1C
Est. average glucose Bld gHb Est-mCnc: 154 mg/dL
Hgb A1c MFr Bld: 7 % — ABNORMAL HIGH (ref 4.8–5.6)

## 2024-10-02 LAB — CMP14+EGFR
ALT: 16 [IU]/L (ref 0–32)
AST: 15 [IU]/L (ref 0–40)
Albumin: 4.3 g/dL (ref 3.8–4.9)
Alkaline Phosphatase: 137 [IU]/L — ABNORMAL HIGH (ref 49–135)
BUN/Creatinine Ratio: 12 (ref 9–23)
BUN: 9 mg/dL (ref 6–24)
Bilirubin Total: 0.5 mg/dL (ref 0.0–1.2)
CO2: 21 mmol/L (ref 20–29)
Calcium: 10.1 mg/dL (ref 8.7–10.2)
Chloride: 102 mmol/L (ref 96–106)
Creatinine, Ser: 0.77 mg/dL (ref 0.57–1.00)
Globulin, Total: 2.7 g/dL (ref 1.5–4.5)
Glucose: 121 mg/dL — ABNORMAL HIGH (ref 70–99)
Potassium: 4.1 mmol/L (ref 3.5–5.2)
Sodium: 140 mmol/L (ref 134–144)
Total Protein: 7 g/dL (ref 6.0–8.5)
eGFR: 91 mL/min/{1.73_m2}

## 2024-10-02 LAB — POCT URINALYSIS DIPSTICK
Bilirubin, UA: NEGATIVE
Blood, UA: NEGATIVE
Glucose, UA: NEGATIVE
Ketones, UA: NEGATIVE
Leukocytes, UA: NEGATIVE
Nitrite, UA: NEGATIVE
Protein, UA: NEGATIVE
Spec Grav, UA: 1.02
Urobilinogen, UA: 0.2 U/dL
pH, UA: 7

## 2024-10-02 LAB — CBC
Hematocrit: 41.7 % (ref 34.0–46.6)
Hemoglobin: 12.5 g/dL (ref 11.1–15.9)
MCH: 23.7 pg — ABNORMAL LOW (ref 26.6–33.0)
MCHC: 30 g/dL — ABNORMAL LOW (ref 31.5–35.7)
MCV: 79 fL (ref 79–97)
Platelets: 387 10*3/uL (ref 150–450)
RBC: 5.27 x10E6/uL (ref 3.77–5.28)
RDW: 15.1 % (ref 11.7–15.4)
WBC: 8.7 10*3/uL (ref 3.4–10.8)

## 2024-10-02 MED ORDER — RYBELSUS 3 MG PO TABS: 1.0000 | ORAL_TABLET | Freq: Every day | ORAL | 2 refills | Status: AC

## 2024-10-02 NOTE — Assessment & Plan Note (Addendum)
 SABRA

## 2024-10-02 NOTE — Patient Instructions (Signed)
 Health Maintenance, Female Adopting a healthy lifestyle and getting preventive care are important in promoting health and wellness. Ask your health care provider about: The right schedule for you to have regular tests and exams. Things you can do on your own to prevent diseases and keep yourself healthy. What should I know about diet, weight, and exercise? Eat a healthy diet  Eat a diet that includes plenty of vegetables, fruits, low-fat dairy products, and lean protein. Do not eat a lot of foods that are high in solid fats, added sugars, or sodium. Maintain a healthy weight Body mass index (BMI) is used to identify weight problems. It estimates body fat based on height and weight. Your health care provider can help determine your BMI and help you achieve or maintain a healthy weight. Get regular exercise Get regular exercise. This is one of the most important things you can do for your health. Most adults should: Exercise for at least 150 minutes each week. The exercise should increase your heart rate and make you sweat (moderate-intensity exercise). Do strengthening exercises at least twice a week. This is in addition to the moderate-intensity exercise. Spend less time sitting. Even light physical activity can be beneficial. Watch cholesterol and blood lipids Have your blood tested for lipids and cholesterol at 56 years of age, then have this test every 5 years. Have your cholesterol levels checked more often if: Your lipid or cholesterol levels are high. You are older than 56 years of age. You are at high risk for heart disease. What should I know about cancer screening? Depending on your health history and family history, you may need to have cancer screening at various ages. This may include screening for: Breast cancer. Cervical cancer. Colorectal cancer. Skin cancer. Lung cancer. What should I know about heart disease, diabetes, and high blood pressure? Blood pressure and heart  disease High blood pressure causes heart disease and increases the risk of stroke. This is more likely to develop in people who have high blood pressure readings or are overweight. Have your blood pressure checked: Every 3-5 years if you are 32-37 years of age. Every year if you are 71 years old or older. Diabetes Have regular diabetes screenings. This checks your fasting blood sugar level. Have the screening done: Once every three years after age 24 if you are at a normal weight and have a low risk for diabetes. More often and at a younger age if you are overweight or have a high risk for diabetes. What should I know about preventing infection? Hepatitis B If you have a higher risk for hepatitis B, you should be screened for this virus. Talk with your health care provider to find out if you are at risk for hepatitis B infection. Hepatitis C Testing is recommended for: Everyone born from [birth year] through 1967. Anyone with known risk factors for hepatitis C. Sexually transmitted infections (STIs) Get screened for STIs, including gonorrhea and chlamydia, if: You are sexually active and are younger than 56 years of age. You are older than 56 years of age and your health care provider tells you that you are at risk for this type of infection. Your sexual activity has changed since you were last screened, and you are at increased risk for chlamydia or gonorrhea. Ask your health care provider if you are at risk. Ask your health care provider about whether you are at high risk for HIV. Your health care provider may recommend a prescription medicine to help prevent HIV  infection. If you choose to take medicine to prevent HIV, you should first get tested for HIV. You should then be tested every 3 months for as long as you are taking the medicine. Pregnancy If you are about to stop having your period (premenopausal) and you may become pregnant, seek counseling before you get pregnant. Take 400 to 800  micrograms (mcg) of folic acid every day if you become pregnant. Ask for birth control (contraception) if you want to prevent pregnancy. Osteoporosis and menopause Osteoporosis is a disease in which the bones lose minerals and strength with aging. This can result in bone fractures. If you are 42 years old or older, or if you are at risk for osteoporosis and fractures, ask your health care provider if you should: Be screened for bone loss. Take a calcium  or vitamin D  supplement to lower your risk of fractures. Be given hormone replacement therapy (HRT) to treat symptoms of menopause. Follow these instructions at home: Alcohol use Do not drink alcohol if: Your health care provider tells you not to drink. You are pregnant, may be pregnant, or are planning to become pregnant. If you drink alcohol: Limit how much you have to: 0-1 drink a day. Know how much alcohol is in your drink. In the [place] one drink equals one 12 oz bottle of beer (355 mL), one 5 oz glass of wine (148 mL), or one 1 oz glass of hard liquor (44 mL). Lifestyle Do not use any products that contain nicotine or tobacco. These products include cigarettes, chewing tobacco, and vaping devices, such as e-cigarettes. If you need help quitting, ask your health care provider. Do not use street drugs. Do not share needles. Ask your health care provider for help if you need support or information about quitting drugs. General instructions Schedule regular health, dental, and eye exams. Stay current with your vaccines. Tell your health care provider if: You often feel depressed. You have ever been abused or do not feel safe at home. This information is not intended to replace advice given to you by your health care provider. Make sure you discuss any questions you have with your health care provider. Document Revised: 02/21/2024 Document Reviewed: 01/04/2021 Elsevier Patient Education  2025 [employer].

## 2024-10-02 NOTE — Progress Notes (Unsigned)
 I,[name], CMA,acting as a neurosurgeon for [name], MD.,have documented all relevant documentation on the behalf of [name], MD,as directed by  [name], MD while in the presence of [name], MD.  Subjective:    Patient ID: Ashley Harrison , female    DOB: 1971-08-22 , 54 y.o.   MRN: [id number]  Chief Complaint  Patient presents with   Annual Exam    The patient is here today for a physical examination. She is followed by Dr. Peter for her GYN exams. She has no specific questions or concerns. Denies chest pain & sob.  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 Her personal insurance does not cover medication. But she asks if the med can be sent to the pharm preferred by the pharmacist she works with. She states not having anymore rybelsus  on hand.  Mammogram sch for end of the month.    Hypertension   Diabetes   Hyperlipidemia    HPI  The patient is here today for a physical examination. She is followed by Dr. Peter for her GYN exams. She has no specific questions or concerns. Denies headache, chest pain & sob.  She admits not taking bp medication this morning.   Hypertension This is a chronic problem. The current episode started more than 1 year ago. The problem has been gradually worsening since onset. The problem is uncontrolled. Pertinent negatives include no anxiety or peripheral edema. There are no associated agents to hypertension. Risk factors for coronary artery disease include obesity and sedentary lifestyle. Past treatments include angiotensin blockers and diuretics. There are no compliance problems.  There is no history of angina or kidney disease.     Past Medical History:  Diagnosis Date   Hypertension    Iron  deficiency anemia due to chronic blood loss 10/31/2017   Iron  deficiency anemia, unspecified    Menometrorrhagia 10/31/2017   Obesity      Family History  Problem Relation Age of  Onset   Hypertension Mother    Diabetes Mother    Hyperlipidemia Father    Hypertension Father    Hypertension Maternal Grandmother    Hypertension Maternal Grandfather     Current Medications[1]   Allergies[2]    The patient states she uses {contraceptive methods:5051} for birth control. No LMP recorded (lmp unknown). Patient has had an ablation.. {Dysmenorrhea-menorrhagia:21918}. Negative for: breast discharge, breast lump(s), breast pain and breast self exam. Associated symptoms include abnormal vaginal bleeding. Pertinent negatives include abnormal bleeding (hematology), anxiety, decreased libido, depression, difficulty falling sleep, dyspareunia, history of infertility, nocturia, sexual dysfunction, sleep disturbances, urinary incontinence, urinary urgency, vaginal discharge and vaginal itching. Diet regular.The patient states her exercise level is    . The patient's tobacco use is: Tobacco Use History[3]. She has been exposed to passive smoke. The patient's alcohol use is:  Social History   Substance and Sexual Activity  Alcohol Use No   Review of Systems  Constitutional: Negative.   HENT: Negative.    Eyes: Negative.   Respiratory: Negative.    Cardiovascular: Negative.   Gastrointestinal: Negative.   Endocrine: Negative.   Genitourinary: Negative.   Musculoskeletal: Negative.   Skin: Negative.   Allergic/Immunologic: Negative.   Neurological: Negative.   Hematological: Negative.   Psychiatric/Behavioral: Negative.       Today's Vitals   10/02/24 0951 10/02/24 1020  BP: (!) 142/100 (!) 144/102  Pulse: 90   Temp:  98.1 F (36.7 C)   SpO2: 98%   Weight: (!) 332 lb 9.6 oz (150.9 kg)   Height: 5' 3 (1.6 m)    Body mass index is 58.92 kg/m.  Wt Readings from Last 3 Encounters:  10/02/24 (!) 332 lb 9.6 oz (150.9 kg)  07/18/24 (!) 329 lb 12.8 oz (149.6 kg)  05/02/24 (!) 336 lb 6.4 oz (152.6 kg)     Objective:  Physical Exam      Assessment And Plan:      Assessment & Plan Encounter for general adult medical examination w/o abnormal findings  Orders:   CBC   CMP14+EGFR   Lipid panel   Hemoglobin A1c  Malignant hypertension  Orders:   POCT Urinalysis Dipstick (81002)   Microalbumin / Creatinine Urine Ratio   EKG 12-Lead  Type 2 diabetes mellitus with morbid obesity (HCC)     Pure hypercholesterolemia     OSA (obstructive sleep apnea)     Primary osteoarthritis of left knee  Orders:   Ambulatory referral to Orthopedic Surgery  Onychomycosis        Return for 1 YEAR HM, 4 MONTH DM F/U.SABRA Patient was given opportunity to ask questions. Patient verbalized understanding of the plan and was able to repeat key elements of the plan. All questions were answered to their satisfaction.   [name], MD  I, [name], MD, have reviewed all documentation for this visit. The documentation on 10/02/24 for the exam, diagnosis, procedures, and orders are all accurate and complete.      [1]  Current Outpatient Medications:    amLODipine  (NORVASC ) 10 MG tablet, Take 1 tablet (10 mg total) by mouth daily., Disp: , Rfl:    atorvastatin  (LIPITOR) 20 MG tablet, Take 1 tablet (20 mg total) by mouth daily., Disp: 90 tablet, Rfl: 2   Multiple Vitamins-Minerals (MULTIVITAMIN WITH MINERALS) tablet, Take 2 tablets by mouth daily., Disp: , Rfl:    valsartan -hydrochlorothiazide  (DIOVAN -HCT) 160-12.5 MG tablet, Take 1 tablet by mouth daily., Disp: 90 tablet, Rfl: 1   Blood Pressure Monitor DEVI, Use to check blood pressure daily. Please dispense XL blood pressure cuff if covered by insurance. I10.0. (Patient not taking: Reported on 10/02/2024), Disp: 1 each, Rfl: 0   Semaglutide  (RYBELSUS ) 3 MG TABS, Take 1 tablet (3 mg total) by mouth daily., Disp: 30 tablet, Rfl: 2 [2]  Allergies Allergen Reactions   Shellfish Allergy Hives  [3]  Social History Tobacco Use  Smoking Status Never   Passive exposure: Never  Smokeless Tobacco  Never

## 2024-10-02 NOTE — Assessment & Plan Note (Signed)
 Orders:    Ambulatory referral to Orthopedic Surgery

## 2024-10-02 NOTE — Assessment & Plan Note (Addendum)
" °  Orders:   POCT Urinalysis Dipstick (81002)   Microalbumin / Creatinine Urine Ratio   EKG 12-Lead  "

## 2024-10-03 LAB — MICROALBUMIN / CREATININE URINE RATIO
Creatinine, Urine: 67 mg/dL
Microalb/Creat Ratio: 18 mg/g{creat} (ref 0–29)
Microalbumin, Urine: 12.2 ug/mL

## 2024-10-30 ENCOUNTER — Encounter (HOSPITAL_BASED_OUTPATIENT_CLINIC_OR_DEPARTMENT_OTHER): Admitting: Cardiovascular Disease

## 2025-02-12 ENCOUNTER — Ambulatory Visit: Payer: Self-pay | Admitting: Internal Medicine

## 2025-10-07 ENCOUNTER — Encounter: Payer: Self-pay | Admitting: Internal Medicine
